# Patient Record
Sex: Female | Born: 1950 | Race: Black or African American | Hispanic: No | Marital: Married | Smoking: Never smoker
Health system: Southern US, Community
[De-identification: ages and names within clinical notes are randomized; demographics above are authoritative.]

## PROBLEM LIST (undated history)

## (undated) DIAGNOSIS — F419 Anxiety disorder, unspecified: Secondary | ICD-10-CM

## (undated) DIAGNOSIS — M1990 Unspecified osteoarthritis, unspecified site: Secondary | ICD-10-CM

## (undated) DIAGNOSIS — E039 Hypothyroidism, unspecified: Secondary | ICD-10-CM

## (undated) DIAGNOSIS — R7303 Prediabetes: Secondary | ICD-10-CM

## (undated) DIAGNOSIS — I10 Essential (primary) hypertension: Secondary | ICD-10-CM

## (undated) DIAGNOSIS — Z860100 Personal history of colon polyps, unspecified: Secondary | ICD-10-CM

## (undated) DIAGNOSIS — D259 Leiomyoma of uterus, unspecified: Secondary | ICD-10-CM

## (undated) DIAGNOSIS — F329 Major depressive disorder, single episode, unspecified: Secondary | ICD-10-CM

## (undated) DIAGNOSIS — J302 Other seasonal allergic rhinitis: Secondary | ICD-10-CM

## (undated) DIAGNOSIS — K5790 Diverticulosis of intestine, part unspecified, without perforation or abscess without bleeding: Secondary | ICD-10-CM

## (undated) DIAGNOSIS — M722 Plantar fascial fibromatosis: Secondary | ICD-10-CM

## (undated) DIAGNOSIS — E785 Hyperlipidemia, unspecified: Secondary | ICD-10-CM

## (undated) HISTORY — DX: Personal history of colon polyps, unspecified: Z86.0100

## (undated) HISTORY — DX: Leiomyoma of uterus, unspecified: D25.9

## (undated) HISTORY — DX: Diverticulosis of intestine, part unspecified, without perforation or abscess without bleeding: K57.90

## (undated) HISTORY — DX: Hyperlipidemia, unspecified: E78.5

## (undated) HISTORY — DX: Major depressive disorder, single episode, unspecified: F32.9

## (undated) HISTORY — DX: Unspecified osteoarthritis, unspecified site: M19.90

## (undated) HISTORY — DX: Anxiety disorder, unspecified: F41.9

## (undated) HISTORY — DX: Other seasonal allergic rhinitis: J30.2

## (undated) HISTORY — DX: Plantar fascial fibromatosis: M72.2

## (undated) HISTORY — PX: KNEE SURGERY: SHX244

## (undated) HISTORY — DX: Prediabetes: R73.03

## (undated) HISTORY — DX: Hypothyroidism, unspecified: E03.9

## (undated) HISTORY — DX: Essential (primary) hypertension: I10

## (undated) HISTORY — DX: Personal history of colonic polyps: Z86.010

---

## 1978-05-28 HISTORY — PX: GASTRIC RESTRICTION SURGERY: SHX653

## 1988-05-28 HISTORY — PX: LAPAROSCOPIC CHOLECYSTECTOMY: SUR755

## 1999-04-18 ENCOUNTER — Encounter: Payer: Self-pay | Admitting: Nephrology

## 1999-04-18 ENCOUNTER — Ambulatory Visit (HOSPITAL_COMMUNITY): Admission: RE | Admit: 1999-04-18 | Discharge: 1999-04-18 | Payer: Self-pay | Admitting: Nephrology

## 1999-04-21 ENCOUNTER — Ambulatory Visit (HOSPITAL_COMMUNITY): Admission: RE | Admit: 1999-04-21 | Discharge: 1999-04-21 | Payer: Self-pay | Admitting: Nephrology

## 1999-04-21 ENCOUNTER — Encounter: Payer: Self-pay | Admitting: Nephrology

## 2000-05-07 ENCOUNTER — Encounter: Payer: Self-pay | Admitting: Nephrology

## 2000-05-07 ENCOUNTER — Encounter: Admission: RE | Admit: 2000-05-07 | Discharge: 2000-05-07 | Payer: Self-pay | Admitting: Nephrology

## 2000-09-28 ENCOUNTER — Emergency Department (HOSPITAL_COMMUNITY): Admission: EM | Admit: 2000-09-28 | Discharge: 2000-09-28 | Payer: Self-pay | Admitting: Emergency Medicine

## 2000-09-28 ENCOUNTER — Encounter: Payer: Self-pay | Admitting: Emergency Medicine

## 2001-10-30 ENCOUNTER — Emergency Department (HOSPITAL_COMMUNITY): Admission: EM | Admit: 2001-10-30 | Discharge: 2001-10-30 | Payer: Self-pay | Admitting: Emergency Medicine

## 2001-11-19 ENCOUNTER — Encounter: Payer: Self-pay | Admitting: Nephrology

## 2001-11-19 ENCOUNTER — Encounter: Admission: RE | Admit: 2001-11-19 | Discharge: 2001-11-19 | Payer: Self-pay | Admitting: Nephrology

## 2002-12-28 ENCOUNTER — Emergency Department (HOSPITAL_COMMUNITY): Admission: EM | Admit: 2002-12-28 | Discharge: 2002-12-28 | Payer: Self-pay | Admitting: Emergency Medicine

## 2003-11-02 ENCOUNTER — Encounter: Admission: RE | Admit: 2003-11-02 | Discharge: 2003-11-02 | Payer: Self-pay | Admitting: Internal Medicine

## 2003-11-06 ENCOUNTER — Ambulatory Visit (HOSPITAL_COMMUNITY): Admission: RE | Admit: 2003-11-06 | Discharge: 2003-11-06 | Payer: Self-pay | Admitting: Internal Medicine

## 2003-11-10 ENCOUNTER — Encounter: Admission: RE | Admit: 2003-11-10 | Discharge: 2003-11-10 | Payer: Self-pay | Admitting: Internal Medicine

## 2003-11-16 ENCOUNTER — Encounter: Admission: RE | Admit: 2003-11-16 | Discharge: 2003-11-16 | Payer: Self-pay | Admitting: Internal Medicine

## 2003-12-07 ENCOUNTER — Encounter: Admission: RE | Admit: 2003-12-07 | Discharge: 2003-12-07 | Payer: Self-pay | Admitting: Internal Medicine

## 2004-01-14 ENCOUNTER — Encounter: Admission: RE | Admit: 2004-01-14 | Discharge: 2004-01-14 | Payer: Self-pay | Admitting: Internal Medicine

## 2004-01-17 ENCOUNTER — Encounter (INDEPENDENT_AMBULATORY_CARE_PROVIDER_SITE_OTHER): Payer: Self-pay | Admitting: Internal Medicine

## 2004-01-17 ENCOUNTER — Ambulatory Visit (HOSPITAL_COMMUNITY): Admission: RE | Admit: 2004-01-17 | Discharge: 2004-01-17 | Payer: Self-pay | Admitting: Obstetrics and Gynecology

## 2004-01-17 LAB — HM PAP SMEAR: HM Pap smear: NORMAL

## 2004-01-17 LAB — CONVERTED CEMR LAB: Pap Smear: NORMAL

## 2004-02-03 ENCOUNTER — Ambulatory Visit (HOSPITAL_COMMUNITY): Admission: RE | Admit: 2004-02-03 | Discharge: 2004-02-03 | Payer: Self-pay | Admitting: Orthopedic Surgery

## 2004-02-03 ENCOUNTER — Ambulatory Visit (HOSPITAL_BASED_OUTPATIENT_CLINIC_OR_DEPARTMENT_OTHER): Admission: RE | Admit: 2004-02-03 | Discharge: 2004-02-03 | Payer: Self-pay | Admitting: Orthopedic Surgery

## 2004-02-15 ENCOUNTER — Encounter: Admission: RE | Admit: 2004-02-15 | Discharge: 2004-05-15 | Payer: Self-pay | Admitting: Orthopedic Surgery

## 2004-06-08 ENCOUNTER — Other Ambulatory Visit: Admission: RE | Admit: 2004-06-08 | Discharge: 2004-06-08 | Payer: Self-pay | Admitting: Obstetrics and Gynecology

## 2004-06-08 ENCOUNTER — Encounter (INDEPENDENT_AMBULATORY_CARE_PROVIDER_SITE_OTHER): Payer: Self-pay | Admitting: *Deleted

## 2004-06-08 ENCOUNTER — Ambulatory Visit: Payer: Self-pay | Admitting: Obstetrics and Gynecology

## 2004-06-27 ENCOUNTER — Ambulatory Visit: Payer: Self-pay | Admitting: Internal Medicine

## 2004-07-06 ENCOUNTER — Inpatient Hospital Stay (HOSPITAL_COMMUNITY): Admission: AD | Admit: 2004-07-06 | Discharge: 2004-07-06 | Payer: Self-pay | Admitting: Obstetrics & Gynecology

## 2004-09-14 ENCOUNTER — Ambulatory Visit: Payer: Self-pay | Admitting: Internal Medicine

## 2005-01-11 ENCOUNTER — Ambulatory Visit: Payer: Self-pay | Admitting: Obstetrics and Gynecology

## 2005-01-17 ENCOUNTER — Ambulatory Visit (HOSPITAL_COMMUNITY): Admission: RE | Admit: 2005-01-17 | Discharge: 2005-01-17 | Payer: Self-pay | Admitting: Obstetrics and Gynecology

## 2005-02-11 ENCOUNTER — Emergency Department (HOSPITAL_COMMUNITY): Admission: EM | Admit: 2005-02-11 | Discharge: 2005-02-11 | Payer: Self-pay | Admitting: Emergency Medicine

## 2005-10-18 ENCOUNTER — Ambulatory Visit: Payer: Self-pay | Admitting: Internal Medicine

## 2005-10-24 ENCOUNTER — Ambulatory Visit: Payer: Self-pay | Admitting: Internal Medicine

## 2005-11-15 ENCOUNTER — Ambulatory Visit: Payer: Self-pay | Admitting: Internal Medicine

## 2005-11-22 ENCOUNTER — Ambulatory Visit (HOSPITAL_COMMUNITY): Admission: RE | Admit: 2005-11-22 | Discharge: 2005-11-22 | Payer: Self-pay | Admitting: Obstetrics and Gynecology

## 2006-04-28 ENCOUNTER — Inpatient Hospital Stay (HOSPITAL_COMMUNITY): Admission: AD | Admit: 2006-04-28 | Discharge: 2006-04-29 | Payer: Self-pay | Admitting: Obstetrics & Gynecology

## 2006-05-06 ENCOUNTER — Encounter (INDEPENDENT_AMBULATORY_CARE_PROVIDER_SITE_OTHER): Payer: Self-pay | Admitting: Internal Medicine

## 2006-05-06 DIAGNOSIS — M25569 Pain in unspecified knee: Secondary | ICD-10-CM

## 2006-05-06 DIAGNOSIS — I10 Essential (primary) hypertension: Secondary | ICD-10-CM

## 2006-05-06 DIAGNOSIS — D509 Iron deficiency anemia, unspecified: Secondary | ICD-10-CM

## 2006-05-06 DIAGNOSIS — D259 Leiomyoma of uterus, unspecified: Secondary | ICD-10-CM | POA: Insufficient documentation

## 2006-05-06 DIAGNOSIS — M549 Dorsalgia, unspecified: Secondary | ICD-10-CM | POA: Insufficient documentation

## 2006-05-06 DIAGNOSIS — E039 Hypothyroidism, unspecified: Secondary | ICD-10-CM

## 2006-05-06 HISTORY — DX: Leiomyoma of uterus, unspecified: D25.9

## 2006-05-17 ENCOUNTER — Ambulatory Visit: Payer: Self-pay | Admitting: Internal Medicine

## 2006-05-17 ENCOUNTER — Encounter (INDEPENDENT_AMBULATORY_CARE_PROVIDER_SITE_OTHER): Payer: Self-pay | Admitting: Internal Medicine

## 2006-05-17 DIAGNOSIS — F341 Dysthymic disorder: Secondary | ICD-10-CM

## 2006-05-17 LAB — CONVERTED CEMR LAB
BUN: 12 mg/dL (ref 6–23)
Chloride: 105 meq/L (ref 96–112)
Creatinine, Ser: 1.14 mg/dL (ref 0.40–1.20)
TSH: 9.513 microintl units/mL — ABNORMAL HIGH (ref 0.350–5.50)

## 2006-05-27 ENCOUNTER — Encounter (INDEPENDENT_AMBULATORY_CARE_PROVIDER_SITE_OTHER): Payer: Self-pay | Admitting: Internal Medicine

## 2006-05-27 ENCOUNTER — Ambulatory Visit: Payer: Self-pay | Admitting: Internal Medicine

## 2006-05-27 LAB — CONVERTED CEMR LAB
BUN: 11 mg/dL (ref 6–23)
CO2: 26 meq/L (ref 19–32)
Sodium: 140 meq/L (ref 135–145)

## 2006-06-24 ENCOUNTER — Ambulatory Visit: Payer: Self-pay | Admitting: Hospitalist

## 2006-06-24 DIAGNOSIS — H9319 Tinnitus, unspecified ear: Secondary | ICD-10-CM | POA: Insufficient documentation

## 2006-07-19 ENCOUNTER — Telehealth (INDEPENDENT_AMBULATORY_CARE_PROVIDER_SITE_OTHER): Payer: Self-pay | Admitting: Internal Medicine

## 2006-07-19 ENCOUNTER — Encounter (INDEPENDENT_AMBULATORY_CARE_PROVIDER_SITE_OTHER): Payer: Self-pay | Admitting: Internal Medicine

## 2006-07-19 ENCOUNTER — Ambulatory Visit: Payer: Self-pay | Admitting: Internal Medicine

## 2006-07-19 ENCOUNTER — Telehealth: Payer: Self-pay | Admitting: *Deleted

## 2006-07-19 LAB — CONVERTED CEMR LAB
BUN: 15 mg/dL (ref 6–23)
Chloride: 105 meq/L (ref 96–112)
Creatinine, Ser: 1.03 mg/dL (ref 0.40–1.20)
Potassium: 4 meq/L (ref 3.5–5.3)
Sodium: 143 meq/L (ref 135–145)

## 2006-07-26 ENCOUNTER — Telehealth: Payer: Self-pay | Admitting: *Deleted

## 2007-06-06 ENCOUNTER — Ambulatory Visit (HOSPITAL_COMMUNITY): Admission: RE | Admit: 2007-06-06 | Discharge: 2007-06-06 | Payer: Self-pay | Admitting: Obstetrics & Gynecology

## 2007-06-12 ENCOUNTER — Encounter: Admission: RE | Admit: 2007-06-12 | Discharge: 2007-06-12 | Payer: Self-pay | Admitting: Nephrology

## 2007-06-25 ENCOUNTER — Ambulatory Visit (HOSPITAL_COMMUNITY): Admission: RE | Admit: 2007-06-25 | Discharge: 2007-06-25 | Payer: Self-pay | Admitting: Obstetrics & Gynecology

## 2007-07-29 ENCOUNTER — Ambulatory Visit (HOSPITAL_BASED_OUTPATIENT_CLINIC_OR_DEPARTMENT_OTHER): Admission: RE | Admit: 2007-07-29 | Discharge: 2007-07-29 | Payer: Self-pay | Admitting: Orthopedic Surgery

## 2007-07-29 ENCOUNTER — Encounter (INDEPENDENT_AMBULATORY_CARE_PROVIDER_SITE_OTHER): Payer: Self-pay | Admitting: Orthopedic Surgery

## 2009-03-25 ENCOUNTER — Ambulatory Visit: Payer: Self-pay | Admitting: Internal Medicine

## 2009-03-25 DIAGNOSIS — M79609 Pain in unspecified limb: Secondary | ICD-10-CM

## 2009-03-26 ENCOUNTER — Encounter (INDEPENDENT_AMBULATORY_CARE_PROVIDER_SITE_OTHER): Payer: Self-pay | Admitting: Internal Medicine

## 2009-03-29 LAB — CONVERTED CEMR LAB
ALT: 54 units/L — ABNORMAL HIGH (ref 0–35)
Albumin: 4.4 g/dL (ref 3.5–5.2)
BUN: 9 mg/dL (ref 6–23)
Cholesterol: 216 mg/dL — ABNORMAL HIGH (ref 0–200)
HDL: 49 mg/dL (ref >39)
MCHC: 32.3 g/dL (ref 30.0–36.0)
Potassium: 4 meq/L (ref 3.5–5.3)
RBC: 4.31 M/uL (ref 3.87–5.11)
RDW: 13.3 % (ref 11.5–15.5)
TSH: 8.423 microintl units/mL — ABNORMAL HIGH (ref 0.350–4.5)
Total Bilirubin: 0.4 mg/dL (ref 0.3–1.2)
Total Protein: 7.8 g/dL (ref 6.0–8.3)

## 2009-03-31 ENCOUNTER — Encounter: Payer: Self-pay | Admitting: Internal Medicine

## 2009-04-05 ENCOUNTER — Ambulatory Visit: Payer: Self-pay | Admitting: Infectious Disease

## 2009-04-14 ENCOUNTER — Telehealth (INDEPENDENT_AMBULATORY_CARE_PROVIDER_SITE_OTHER): Payer: Self-pay | Admitting: *Deleted

## 2009-04-18 ENCOUNTER — Ambulatory Visit: Payer: Self-pay | Admitting: Internal Medicine

## 2009-04-18 DIAGNOSIS — R059 Cough, unspecified: Secondary | ICD-10-CM | POA: Insufficient documentation

## 2009-04-18 DIAGNOSIS — R05 Cough: Secondary | ICD-10-CM

## 2009-04-18 LAB — CONVERTED CEMR LAB
Free T4: 0.9 ng/dL (ref 0.80–1.80)
TSH: 3.548 microintl units/mL (ref 0.350–4.5)

## 2009-04-19 ENCOUNTER — Encounter: Payer: Self-pay | Admitting: Internal Medicine

## 2009-05-18 ENCOUNTER — Encounter: Payer: Self-pay | Admitting: Internal Medicine

## 2009-05-18 ENCOUNTER — Ambulatory Visit: Payer: Self-pay | Admitting: Internal Medicine

## 2009-05-19 LAB — CONVERTED CEMR LAB
CO2: 24 meq/L (ref 19–32)
Calcium: 10.1 mg/dL (ref 8.4–10.5)
Chloride: 103 meq/L (ref 96–112)
Creatinine, Ser: 0.78 mg/dL (ref 0.40–1.20)
Potassium: 4.4 meq/L (ref 3.5–5.3)
Sodium: 140 meq/L (ref 135–145)

## 2009-06-01 ENCOUNTER — Ambulatory Visit: Payer: Self-pay | Admitting: Internal Medicine

## 2009-06-01 DIAGNOSIS — R740 Nonspecific elevation of levels of transaminase and lactic acid dehydrogenase [LDH]: Secondary | ICD-10-CM

## 2009-06-01 DIAGNOSIS — R7401 Elevation of levels of liver transaminase levels: Secondary | ICD-10-CM | POA: Insufficient documentation

## 2009-07-26 ENCOUNTER — Ambulatory Visit: Payer: Self-pay | Admitting: Internal Medicine

## 2009-07-26 DIAGNOSIS — R350 Frequency of micturition: Secondary | ICD-10-CM

## 2009-07-26 LAB — CONVERTED CEMR LAB
AST: 34 units/L (ref 0–37)
Alkaline Phosphatase: 89 units/L (ref 39–117)
BUN: 13 mg/dL (ref 6–23)
Bacteria, UA: NONE SEEN
Bilirubin Urine: NEGATIVE
CO2: 30 meq/L (ref 19–32)
Glucose, Bld: 100 mg/dL — ABNORMAL HIGH (ref 70–99)
Hemoglobin, Urine: NEGATIVE
Nitrite: NEGATIVE
Protein, ur: 30 mg/dL — AB
Total Bilirubin: 0.4 mg/dL (ref 0.3–1.2)
Urine Glucose: NEGATIVE mg/dL
pH: 6.5 (ref 5.0–8.0)

## 2009-07-27 ENCOUNTER — Ambulatory Visit: Payer: Self-pay | Admitting: Sports Medicine

## 2009-07-27 DIAGNOSIS — M7660 Achilles tendinitis, unspecified leg: Secondary | ICD-10-CM

## 2009-08-02 ENCOUNTER — Ambulatory Visit (HOSPITAL_COMMUNITY): Admission: RE | Admit: 2009-08-02 | Discharge: 2009-08-02 | Payer: Self-pay | Admitting: Internal Medicine

## 2009-08-02 LAB — HM MAMMOGRAPHY: HM Mammogram: NEGATIVE

## 2009-08-17 ENCOUNTER — Ambulatory Visit: Payer: Self-pay | Admitting: Sports Medicine

## 2009-08-22 ENCOUNTER — Encounter: Payer: Self-pay | Admitting: Internal Medicine

## 2009-08-30 ENCOUNTER — Encounter: Admission: RE | Admit: 2009-08-30 | Discharge: 2009-08-30 | Payer: Self-pay | Admitting: Surgery

## 2009-09-01 ENCOUNTER — Ambulatory Visit (HOSPITAL_BASED_OUTPATIENT_CLINIC_OR_DEPARTMENT_OTHER): Admission: RE | Admit: 2009-09-01 | Discharge: 2009-09-01 | Payer: Self-pay | Admitting: Surgery

## 2009-09-29 ENCOUNTER — Telehealth: Payer: Self-pay | Admitting: Internal Medicine

## 2009-10-26 ENCOUNTER — Ambulatory Visit: Payer: Self-pay | Admitting: Sports Medicine

## 2009-11-07 ENCOUNTER — Ambulatory Visit: Payer: Self-pay | Admitting: Internal Medicine

## 2009-11-07 LAB — CONVERTED CEMR LAB: TSH: 4.031 microintl units/mL (ref 0.350–4.5)

## 2009-11-25 ENCOUNTER — Encounter (INDEPENDENT_AMBULATORY_CARE_PROVIDER_SITE_OTHER): Payer: Self-pay | Admitting: *Deleted

## 2009-12-13 ENCOUNTER — Encounter (INDEPENDENT_AMBULATORY_CARE_PROVIDER_SITE_OTHER): Payer: Self-pay | Admitting: *Deleted

## 2009-12-14 ENCOUNTER — Ambulatory Visit: Payer: Self-pay | Admitting: Obstetrics and Gynecology

## 2009-12-16 ENCOUNTER — Ambulatory Visit: Payer: Self-pay | Admitting: Gastroenterology

## 2009-12-16 ENCOUNTER — Encounter (INDEPENDENT_AMBULATORY_CARE_PROVIDER_SITE_OTHER): Payer: Self-pay | Admitting: *Deleted

## 2009-12-19 ENCOUNTER — Ambulatory Visit (HOSPITAL_COMMUNITY): Admission: RE | Admit: 2009-12-19 | Discharge: 2009-12-19 | Payer: Self-pay | Admitting: Family Medicine

## 2009-12-26 ENCOUNTER — Ambulatory Visit: Payer: Self-pay | Admitting: Family Medicine

## 2009-12-28 ENCOUNTER — Ambulatory Visit: Payer: Self-pay | Admitting: Obstetrics and Gynecology

## 2009-12-28 ENCOUNTER — Telehealth (INDEPENDENT_AMBULATORY_CARE_PROVIDER_SITE_OTHER): Payer: Self-pay | Admitting: *Deleted

## 2009-12-30 ENCOUNTER — Ambulatory Visit: Payer: Self-pay | Admitting: Gastroenterology

## 2010-01-04 ENCOUNTER — Encounter: Payer: Self-pay | Admitting: Gastroenterology

## 2010-01-06 ENCOUNTER — Ambulatory Visit: Payer: Self-pay | Admitting: Internal Medicine

## 2010-01-06 DIAGNOSIS — M25559 Pain in unspecified hip: Secondary | ICD-10-CM

## 2010-02-01 ENCOUNTER — Telehealth: Payer: Self-pay | Admitting: Internal Medicine

## 2010-03-22 ENCOUNTER — Ambulatory Visit: Payer: Self-pay | Admitting: Obstetrics and Gynecology

## 2010-03-28 ENCOUNTER — Encounter: Payer: Self-pay | Admitting: Internal Medicine

## 2010-03-28 HISTORY — PX: TOTAL ABDOMINAL HYSTERECTOMY W/ BILATERAL SALPINGOOPHORECTOMY: SHX83

## 2010-04-03 ENCOUNTER — Encounter: Payer: Self-pay | Admitting: Obstetrics and Gynecology

## 2010-04-03 ENCOUNTER — Inpatient Hospital Stay (HOSPITAL_COMMUNITY): Admission: RE | Admit: 2010-04-03 | Discharge: 2010-04-05 | Payer: Self-pay | Admitting: Obstetrics and Gynecology

## 2010-04-13 ENCOUNTER — Ambulatory Visit: Payer: Self-pay | Admitting: Obstetrics & Gynecology

## 2010-05-03 ENCOUNTER — Ambulatory Visit: Payer: Self-pay | Admitting: Obstetrics and Gynecology

## 2010-05-05 ENCOUNTER — Ambulatory Visit: Payer: Self-pay | Admitting: Family Medicine

## 2010-06-18 ENCOUNTER — Encounter: Payer: Self-pay | Admitting: Nephrology

## 2010-06-29 NOTE — Letter (Signed)
Summary: Patient Notice- Polyp Results  [facility]  [address] [place], [place] [address]   Phone: [phone]  Fax: [phone]        January 04, 2010 MRN: [id number]    Joanna Gilbert [address] [place], [place]  [address]    Dear [name],  I am pleased to inform you that the colon polyp(s) removed during your recent colonoscopy was (were) found to be benign (no cancer detected) upon pathologic examination.  I recommend you have a repeat colonoscopy examination in 5_ years to look for recurrent polyps, as having colon polyps increases your risk for having recurrent polyps or even colon cancer in the future.  Should you develop new or worsening symptoms of abdominal pain, bowel habit changes or bleeding from the rectum or bowels, please schedule an evaluation with either your primary care physician or with me.  Additional information/recommendations:  __ No further action with gastroenterology is needed at this time. Please      follow-up with your primary care physician for your other healthcare      needs.  __ Please call [phone] to schedule a return visit to review your      situation.  __ Please keep your follow-up visit as already scheduled.  _x_ Continue treatment plan as outlined the day of your exam.  Please call us if you are having persistent problems or have questions about your condition that have not been fully answered at this time.  Sincerely,  Louis Meckel MD  This letter has been electronically signed by your physician.  Appended Document: Patient Notice- Polyp Results letter mailed

## 2010-06-29 NOTE — Assessment & Plan Note (Signed)
Summary: RECK BP/MAGICK/VS   Vital Signs:  Patient profile:   61 year old female Height:      65 inches (165.10 cm) Weight:      256.7 pounds (117.59 kg) BMI:     43.21 Temp:     97.7 degrees F (36.50 degrees C) oral Pulse rate:   83 / minute BP sitting:   155 / 95  (right arm) Cuff size:   large  Vitals Entered By: Theotis Barrio NT II (June 01, 2009 8:46 AM) CC: BP FOLLOW P, Depression Is Patient Diabetic? No Pain Assessment Patient in pain? yes     Location: FEET Intensity:      3 Type: aching Onset of pain  Chronic Nutritional Status BMI of > 30 = obese  Have you ever been in a relationship where you felt threatened, hurt or afraid?No   Does patient need assistance? Functional Status Self care Ambulation Normal Comments BP FOLLOW P   Primary Care Provider:  Mliss Sax MD  CC:  BP FOLLOW P and Depression.  History of Present Illness: 60 yo woman with PMH of HTN who presents for 2 week fu of HTN.  Patient had to stop Lisinopril in last few months 2/2 cough.  Was started on HCTZ then amlidipine.   Patient says that she had cramps on HCTZ that she thinks started after she started taking the HCTZ. She says the has never had these cramps before. She took  ~7 days of therapy and had cramps that started on the first day of therapy. She had these cramps for  ~3 days. She stopped taking the medicine  ~2 weeks before her last appointment.  Of note, she has not been taking HCTZ since her last appointment. The plan at last visit was to check her K+ and have her restart HCTZ plus or minus K+ supplementation given results but patient has not actually restarted HCTZ.  Cyst on head: Patient says cyst is getting smaller since last visit.  Plantar Fasciitis: Has sports med appointment today.  Preventive Health: She knows she is not up todate on mammo, colooscopy, etc. She tells me that she has no insurance but is getting all of her paperwork together for the orange card  and that she wants me to wait before making any referrals until she has orange card soon. She refuses flu today.   Depression History:      The patient denies a depressed mood most of the day and a diminished interest in her usual daily activities.         Preventive Screening-Counseling & Management  Alcohol-Tobacco     Smoking Status: never  Caffeine-Diet-Exercise     Does Patient Exercise: yes     Type of exercise: WALKING / CARDIO  Problems Prior to Update: 1)  Nonspecific Abn Fndng Rad & Oth Exm Skull & Head  (ICD-793.0) 2)  Cough  (ICD-786.2) 3)  Foot Pain, Bilateral  (ICD-729.5) 4)  Tinnitus, Chronic, Bilateral  (ICD-388.30) 5)  Hypertension  (ICD-401.9) 6)  Anxiety Depression  (ICD-300.4) 7)  Hypothyroidism  (ICD-244.9) 8)  Anemia, Iron Deficiency  (ICD-280.9) 9)  Obesity  (ICD-278.00) 10)  Fibroids, Uterus  (ICD-218.9) 11)  Knee Pain  (ICD-719.46) 12)  Back Pain  (ICD-724.5)  Current Medications (verified): 1)  Levothyroxine Sodium 100 Mcg Tabs (Levothyroxine Sodium) .... Once Daily 2)  Hydrochlorothiazide 25 Mg Tabs (Hydrochlorothiazide) .... Take 1 Tablet By Mouth Daily. 3)  Omeprazole 40 Mg Cpdr (Omeprazole) .... Take 1 Tablet  By Mouth Daily. 4)  Norvasc 10 Mg Tabs (Amlodipine Besylate) .... Take 1 Tablet By Mouth Daily.  Allergies (verified): 1)  ! Lisinopril  Review of Systems  The patient denies anorexia, fever, weight loss, weight gain, vision loss, decreased hearing, hoarseness, chest pain, syncope, dyspnea on exertion, peripheral edema, prolonged cough, headaches, hemoptysis, abdominal pain, melena, hematochezia, severe indigestion/heartburn, hematuria, incontinence, genital sores, muscle weakness, transient blindness, difficulty walking, depression, unusual weight change, abnormal bleeding, and angioedema.    Physical Exam  General:  alert, well-developed, and well-nourished.  Obese. Head:  normocephalic and atraumatic.   Eyes:  vision grossly intact,  pupils equal, pupils round, and pupils reactive to light.   Ears:  R ear normal and L ear normal.   Nose:  no external deformity.   Lungs:  normal respiratory effort, no accessory muscle use, normal breath sounds, no crackles, and no wheezes.   Heart:  normal rate, regular rhythm, no murmur, no gallop, and no rub.   Abdomen:  soft, non-tender, and normal bowel sounds.   Neurologic:  alert & oriented X3, cranial nerves II-XII intact, and strength normal in all extremities.   Skin:  Patient has few CM cyst on top of head.  Psych:  Oriented X3, memory intact for recent and remote, normally interactive, good eye contact, not anxious appearing, and not depressed appearing.     Impression & Recommendations:  Problem # 1:  HYPERTENSION (ICD-401.9) Patient stopped taking HCTZ  ~2 weeks before her last appointment, and since her K+ was checked at that appointment and was normal it is not possible to know if the cramps she was having her due to low K+ or not. Her BP is improved on amlodipine alone, but not yet at goal.  She has not been taking HCTZ since her last appointment. The plan at last visit was to check her K+ and have her restart HCTZ plus or minus K+ supplementation given results but patient has not actually restarted HCTZ. I think that HCTZ is such an effective, cheap medication, especially when combined with amlodipine that she deserves a trial of therapy again. I will have her start taking HCTZ again. I will not provide K+ supplementation as I have no real evidence that her K+ was low, although it is possible as mentioned above. I will bring her back to see me next week at which time I will check K+. If she does experience cramps, I have asked her to call the clinic to come in to be seen. We can check her K+ at that time and see if it is low. My hope is that she will restart HCTZ without problem.    Her updated medication list for this problem includes:    Hydrochlorothiazide 25 Mg Tabs  (Hydrochlorothiazide) .Marland Kitchen... Take 1 tablet by mouth daily.    Norvasc 10 Mg Tabs (Amlodipine besylate) .Marland Kitchen... Take 1 tablet by mouth daily.  Problem # 2:  NONSPECIFIC ABN FNDNG RAD & OTH EXM SKULL & HEAD (ICD-793.0) Cyst getting smaller per patient. WIll simply observe for now.   Problem # 3:  FOOT PAIN, BILATERAL (ICD-729.5) Seeing sports med today. Will follow up with her next week to see how this goes since she is coming back for HTN management next week anyway.   Problem # 4:  Preventive Health Care (ICD-V70.0) She knows she is not up to date on mammo, colooscopy, etc. She tells me that she has no insurance but is getting all of her paperwork together for  the orange card and that she wants me to wait before making any referrals until she has orange card soon. She refuses flu today. We will need to make referral once she agrees when she gets her orange card.   Problem # 5:  TRANSAMINASES, SERUM, ELEVATED (ICD-790.4) I see that at a visit in 02-2009 that patient had elevated transaminases of AST 60, ALT 54. This was never followed up. I was going to check them today again but since she is coming to see me next week for a lab draw after restarting HCTZ I will simply get a Cmet at that time to see if they are still elevated. If they are she will need hepatitis panel, RUQ [place].   Complete Medication List: 1)  Levothyroxine Sodium 100 Mcg Tabs (Levothyroxine sodium) .... Once daily 2)  Hydrochlorothiazide 25 Mg Tabs (Hydrochlorothiazide) .... Take 1 tablet by mouth daily. 3)  Omeprazole 40 Mg Cpdr (Omeprazole) .... Take 1 tablet by mouth daily. 4)  Norvasc 10 Mg Tabs (Amlodipine besylate) .... Take 1 tablet by mouth daily.  Patient Instructions: 1)  Please restart HCTZ and make an appointment for next week. If you experience cramping, please call the clinic so that we can bring you in to check your potassium. Please continue to take the medication if you can until you are seen in clinic even if you  have cramping.   Prevention & Chronic Care Immunizations   Influenza vaccine: Not documented   Influenza vaccine deferral: Deferred  (06/01/2009)    Tetanus booster: Not documented   Td booster deferral: Deferred  (06/01/2009)    Pneumococcal vaccine: Not documented  Colorectal Screening   Hemoccult: Not documented   Hemoccult action/deferral: Deferred  (06/01/2009)    Colonoscopy: Not documented   Colonoscopy action/deferral: Deferred  (06/01/2009)  Other Screening   Pap smear: Normal  (01/17/2004)   Pap smear action/deferral: Deferred  (06/01/2009)    Mammogram: Normal  (11/26/2005)   Mammogram action/deferral: Deferred  (06/01/2009)   Smoking status: never  (06/01/2009)  Lipids   Total Cholesterol: 216  (03/26/2009)   Lipid panel action/deferral: Lipid Panel ordered   LDL: 141  (03/26/2009)   LDL Direct: Not documented   HDL: 49  (03/26/2009)   Triglycerides: 132  (03/26/2009)  Hypertension   Last Blood Pressure: 155 / 95  (06/01/2009)   Serum creatinine: 0.78  (05/18/2009)   Serum potassium 4.4  (05/18/2009)    Hypertension flowsheet reviewed?: Yes   Progress toward BP goal: Improved  Self-Management Support :   Personal Goals (by the next clinic visit) :      Personal blood pressure goal: 140/90  (03/25/2009)   Patient will work on the following items until the next clinic visit to reach self-care goals:     Medications and monitoring: take my medicines every day, bring all of my medications to every visit  (06/01/2009)     Eating: eat more vegetables, use fresh or frozen vegetables, eat foods that are low in salt, eat baked foods instead of fried foods, eat fruit for snacks and desserts  (06/01/2009)    Hypertension self-management support: Written self-care plan  (06/01/2009)   Hypertension self-care plan printed.

## 2010-06-29 NOTE — Consult Note (Signed)
Summary: [facility]   [facility]   Imported By: Margie Billet 05/24/2010 12:29:29  _____________________________________________________________________  External Attachment:    Type:   Image     Comment:   External Document

## 2010-06-29 NOTE — Assessment & Plan Note (Signed)
Summary: FU FOOT PAIN/MJD   Vital Signs:  Patient profile:   60 year old female BP sitting:   128 / 82  Vitals Entered By: Lillia Pauls CMA (October 26, 2009 3:04 PM)  Primary Provider:  Mliss Sax MD   History of Present Illness: f/u bilateral plantar fasciitis and left achilles tendonitis. Bought asics shoes since LOV. wearign them with comforthotics.  PF symptoms improved on left. Symptoms still bothersome on right. Has developed some distal left AT pain as well. No clear moderating factors. No paresthesias. Performing PF stretches daily. Inconsistently performing other exercises.  Allergies: 1)  ! Lisinopril  Physical Exam  General:  Well-developed,well-nourished,in no acute distress; alert,appropriate and cooperative throughout examination Msk:  ANKLES/FEET: Very mild prox PF ttp on left. Mild-to-mod prox PF ttp on right. Slight distal AT ttp on right. Normal AT function. No swelling or deformity.    Impression & Recommendations:  Problem # 1:  PLANTAR FASCIITIS, BILATERAL (ICD-728.71) PF symptoms improved on left.  - Probation officer of custom orthotics. Call us if she decides to have orthotics constructed during next office visit. - Start tramadol. - RTC in 4 wks.  Her updated medication list for this problem includes:    Nabumetone 500 Mg Tabs (Nabumetone) .Marland Kitchen... 1 tab by mouth with food bid  Problem # 2:  ACHILLES TENDINITIS, MILD (ICD-726.71)  - Heel lifts added to current comforthotics. - Remaining plan per item #1.  Problem # 3:  OTHER ACQUIRED DEFORMITY OF ANKLE AND FOOT OTHER (ICD-736.79)  - Heel lifts added to current comforthotics.  Complete Medication List: 1)  Levothyroxine Sodium 100 Mcg Tabs (Levothyroxine sodium) .... Once daily 2)  Hydrochlorothiazide 25 Mg Tabs (Hydrochlorothiazide) .... Take 1 tablet by mouth daily. 3)  Omeprazole 40 Mg Cpdr (Omeprazole) .... Take 1 tablet by mouth daily. 4)  Norvasc 10 Mg Tabs (Amlodipine  besylate) .... Take 1 tablet by mouth daily. 5)  Nabumetone 500 Mg Tabs (Nabumetone) .Marland Kitchen.. 1 tab by mouth with food bid 6)  Tramadol Hcl 50 Mg Tabs (Tramadol hcl) .Marland Kitchen.. 1 tab by mouth q 6 hrs as needed for pain Prescriptions: TRAMADOL HCL 50 MG TABS (TRAMADOL HCL) 1 tab by mouth q 6 hrs as needed for pain  #120 x 0   Entered and Authorized by:   Valarie Merino MD   Signed by:   Valarie Merino MD on 10/26/2009   Method used:   Print then Give to Patient   RxID:   [id number]

## 2010-06-29 NOTE — Assessment & Plan Note (Signed)
Summary: ORTHOTICS,[facility]   Vital Signs:  Patient profile:   60 year old female BP sitting:   137 / 67  (left arm)  Vitals Entered By: Terese Door (December 26, 2009 2:09 PM) CC: Orthotics   Primary Care Provider:  Mliss Sax MD  CC:  Orthotics.  History of Present Illness: B foot pain diagnosed with B plantar fasciitis--is here for custom molded othortics. Pain is in both feet, pretty constant, worsens with standing. Rest makes it some better butthe first few steps after exended period of rest (sleeping) are very painful. Then ittypically gets a little better and thenworsens as the day progresses.  also has some left achilles pain as previously described--the small heel lift seemed to help this some PERTINENT PMH/PSH: no prior foot injury or surgery  Allergies: 1)  ! Lisinopril  Past History:  Past Medical History: Last updated: 05/06/2006 Hypertension Hypothyroidism  Review of Systems  The patient denies fever, weight loss, and weight gain.    Physical Exam  General:  alert, well-developed, well-nourished, well-hydrated, and overweight-appearing.   Msk:  B severe pes planus. Wide forefoot.  TTP at origin of planar fascia B.  Sensation to soft touch is intact B feet DP pulses 2+ B= Skin is without lesion--she does have some increased callous B medial great toes.  Gait is normal stride length, normal stance phase, out toeing. Additional Exam:  Patient was fitted for a : standard, cushioned, semi-rigid orthotic. The orthotic was heated and afterward the patient stood on the orthotic blank positioned on the orthotic stand. The patient was positioned in subtalar neutral position and 10 degrees of ankle dorsiflexion in a weight bearing stance. After completion of molding, a stable base was applied to the orthotic blank. The blank was ground to a stable position for weight bearing.  Base:Black blank with F3 white firm base Posting: Medial first ray B (I did not build up  left orthotic with lift---will see how her achilles tendonitis does in the orthotic)    Impression & Recommendations:  Problem # 1:  PLANTAR FASCIITIS, BILATERAL (ICD-728.71)  Her updated medication list for this problem includes:    Nabumetone 500 Mg Tabs (Nabumetone) .Marland Kitchen... 1 tab by mouth with food bid  Orders: [employer], each unit (L3002) orthotics manufactured face to face time 45 minutes in evaluation of foot problems and construction of orthotics. RTC prn  Problem # 2:  ACHILLES TENDINITIS, MILD (ICD-726.71)  Orders: [employer], each unit (J1914)  Complete Medication List: 1)  Levothyroxine Sodium 100 Mcg Tabs (Levothyroxine sodium) .... Once daily 2)  Hydrochlorothiazide 25 Mg Tabs (Hydrochlorothiazide) .... Take 1 tablet by mouth daily. 3)  Omeprazole 40 Mg Cpdr (Omeprazole) .... Take 1 tablet by mouth daily. 4)  Norvasc 10 Mg Tabs (Amlodipine besylate) .... Take 1 tablet by mouth daily. 5)  Nabumetone 500 Mg Tabs (Nabumetone) .Marland Kitchen.. 1 tab by mouth with food bid 6)  Tramadol Hcl 50 Mg Tabs (Tramadol hcl) .Marland Kitchen.. 1 tab by mouth q 6 hrs as needed for pain

## 2010-06-29 NOTE — Miscellaneous (Signed)
Summary: direct colon--ch.  Clinical Lists Changes  Medications: Added new medication of MIRALAX   POWD (POLYETHYLENE GLYCOL 3350) As directed - Signed Added new medication of REGLAN 10 MG  TABS (METOCLOPRAMIDE HCL) As directed - Signed Added new medication of DULCOLAX 5 MG  TBEC (BISACODYL) As directed - Signed Rx of MIRALAX   POWD (POLYETHYLENE GLYCOL 3350) As directed;  #255 x 0;  Signed;  Entered by: Clide Cliff RN;  Authorized by: Rachael Fee MD;  Method used: Electronically to [facility] [phone]*, [address], [place], [place]  [address], Ph: [phone], Fax: [phone] Rx of REGLAN 10 MG  TABS (METOCLOPRAMIDE HCL) As directed;  #2 x 0;  Signed;  Entered by: Clide Cliff RN;  Authorized by: Rachael Fee MD;  Method used: Electronically to [facility] [phone]*, [address], [place], [place]  [address], Ph: [phone], Fax: [phone] Rx of DULCOLAX 5 MG  TBEC (BISACODYL) As directed;  #4 x 0;  Signed;  Entered by: Clide Cliff RN;  Authorized by: Rachael Fee MD;  Method used: Electronically to [facility] [phone]*, [address], [place], [place]  [address], Ph: [phone], Fax: [phone] Observations: Added new observation of ALLERGY REV: Done (12/16/2009 8:52)    Prescriptions: DULCOLAX 5 MG  TBEC (BISACODYL) As directed  #4 x 0   Entered by:   Clide Cliff RN   Authorized by:   Rachael Fee MD   Signed by:   Clide Cliff RN on 12/16/2009   Method used:   Electronically to        [facility] [phone]* (retail)       [address]       [place], [place]  [address]       Ph: [phone]       Fax: [phone]   RxID:   [id number] REGLAN 10 MG  TABS (METOCLOPRAMIDE HCL) As directed  #2 x 0   Entered by:   Clide Cliff RN   Authorized by:   Rachael Fee MD   Signed by:   Clide Cliff RN on 12/16/2009   Method used:   Electronically to        [facility] [phone]* (retail)       [address]     [place], [place]  [address]       Ph: [phone]       Fax: [phone]   RxID:   [id number] MIRALAX   POWD (POLYETHYLENE GLYCOL 3350) As directed  #255 x 0   Entered by:   Clide Cliff RN   Authorized by:   Rachael Fee MD   Signed by:   Clide Cliff RN on 12/16/2009   Method used:   Electronically to        [facility] [phone]* (retail)       [address]       [place], [place]  [address]       Ph: [phone]       Fax: [phone]   RxID:   [id number]

## 2010-06-29 NOTE — Letter (Signed)
Summary: [facility] Instructions  [facility]  [address] [place], [place] [address]   Phone: [phone]  Fax: [phone]       LAURITA PERON    September 24, 1950    MRN: [id number]        Procedure Day Dorna Bloom: Farrell Ours  12/30/09     Arrival Time:  10:30am     Procedure Time: 11:30am     Location of Procedure:                    _X _  [facility] (4th Floor)                        PREPARATION FOR COLONOSCOPY WITH MOVIPREP   Starting 5 days prior to your procedure  SUNDAY 07/31  do not eat nuts, seeds, popcorn, corn, beans, peas,  salads, or any raw vegetables.  Do not take any fiber supplements (e.g. Metamucil, Citrucel, and Benefiber).  THE DAY BEFORE YOUR PROCEDURE         DATE:  THURSDAY  08/04  1.  Drink clear liquids the entire day-NO SOLID FOOD  2.  Do not drink anything colored red or purple.  Avoid juices with pulp.  No orange juice.  3.  Drink at least 64 oz. (8 glasses) of fluid/clear liquids during the day to prevent dehydration and help the prep work efficiently.  CLEAR LIQUIDS INCLUDE: Water Jello Ice Popsicles Tea (sugar ok, no milk/cream) Powdered fruit flavored drinks Coffee (sugar ok, no milk/cream) Gatorade Juice: apple, white grape, white cranberry  Lemonade Clear bullion, consomm, broth Carbonated beverages (any kind) Strained chicken noodle soup Hard Candy                             4.  In the morning, mix first dose of MoviPrep solution:    Empty 1 Pouch A and 1 Pouch B into the disposable container    Add lukewarm drinking water to the top line of the container. Mix to dissolve    Refrigerate (mixed solution should be used within 24 hrs)  5.  Begin drinking the prep at 5:00 p.m. The MoviPrep container is divided by 4 marks.   Every 15 minutes drink the solution down to the next mark (approximately 8 oz) until the full liter is complete.   6.  Follow completed prep with 16 oz of clear liquid of your choice  (Nothing red or purple).  Continue to drink clear liquids until bedtime.  7.  Before going to bed, mix second dose of MoviPrep solution:    Empty 1 Pouch A and 1 Pouch B into the disposable container    Add lukewarm drinking water to the top line of the container. Mix to dissolve    Refrigerate  THE DAY OF YOUR PROCEDURE      DATE: FRIDAY  08/05  Beginning at 6:30 a.m. (5 hours before procedure):         1. Every 15 minutes, drink the solution down to the next mark (approx 8 oz) until the full liter is complete.  2. Follow completed prep with 16 oz. of clear liquid of your choice.    3. You may drink clear liquids until 9:30am  (2 HOURS BEFORE PROCEDURE).   MEDICATION INSTRUCTIONS  Unless otherwise instructed, you should take regular prescription medications with a small sip of water   as early as possible  the morning of your procedure.  Diabetic patients - see separate instructions.  Stop taking Plavix or Aggrenox on  _  _  (7 days before procedure).     Stop taking Coumadin on  _ _  (5 days before procedure).  Additional medication instructions: _         OTHER INSTRUCTIONS  You will need a responsible adult at least 58 years of age to accompany you and drive you home.   This person must remain in the waiting room during your procedure.  Wear loose fitting clothing that is easily removed.  Leave jewelry and other valuables at home.  However, you may wish to bring a book to read or  an iPod/MP3 player to listen to music as you wait for your procedure to start.  Remove all body piercing jewelry and leave at home.  Total time from sign-in until discharge is approximately 2-3 hours.  You should go home directly after your procedure and rest.  You can resume normal activities the  day after your procedure.  The day of your procedure you should not:   Drive   Make legal decisions   Operate machinery   Drink alcohol   Return to work  You will receive  specific instructions about eating, activities and medications before you leave.    The above instructions have been reviewed and explained to me by   Patient did not use the moviprep due to financial hardship.  The patient used mirilax.  Rosalita Chessman hodges, RN_______________________    I fully understand and can verbalize these instructions _____________________________ Date _________

## 2010-06-29 NOTE — Assessment & Plan Note (Signed)
Summary: EST-CK/FU/MEDS/CFB   Vital Signs:  Patient profile:   59 year old female Height:      65 inches (165.10 cm) Weight:      259.3 pounds (117.86 kg) BMI:     43.31 Temp:     98.0 degrees F (36.67 degrees C) oral Pulse rate:   75 / minute BP sitting:   132 / 73  (right arm)  Vitals Entered By: Chinita Pester RN (November 07, 2009 8:43 AM) CC: Needs another GYN referral. Had scalp cyst removed about 1 month ago. Is Patient Diabetic? No Pain Assessment Patient in pain? no      Nutritional Status BMI of > 30 = obese  Have you ever been in a relationship where you felt threatened, hurt or afraid?No   Does patient need assistance? Functional Status Self care Ambulation Normal   Primary Care Provider:  Mliss Sax MD  CC:  Needs another GYN referral. Had scalp cyst removed about 1 month ago.Marland Kitchen  History of Present Illness: 60 yo female with PMH outlined below presents to [facility] for regular follow up appointment. She has no concerns at the time. No recent sicknesses or hospitalizaitons. No episodes of chest pain, SOB, palpitations. No specific abdominal or urinary concerns. No recent changes in appetite, weight, sleep patterns, mood. She would like to have GYN referal to follow up on her fibroids.    Depression History:      The patient denies a depressed mood most of the day and a diminished interest in her usual daily activities.  The patient denies significant weight loss, significant weight gain, insomnia, hypersomnia, psychomotor agitation, psychomotor retardation, fatigue (loss of energy), feelings of worthlessness (guilt), impaired concentration (indecisiveness), and recurrent thoughts of death or suicide.  The patient denies symptoms of a manic disorder including excessive foolish business investments.        The patient denies that she feels like life is not worth living, denies that she wishes that she were dead, and denies that she has thought about ending her life.           Preventive Screening-Counseling & Management  Alcohol-Tobacco     Alcohol drinks/day: 0     Smoking Status: never  Caffeine-Diet-Exercise     Does Patient Exercise: no  Problems Prior to Update: 1)  Other Acquired Deformity of Ankle and Foot Other  (ICD-736.79) 2)  Achilles Tendinitis, Mild  (ICD-726.71) 3)  Plantar Fasciitis, Bilateral  (ICD-728.71) 4)  Urinary Frequency  (ICD-788.41) 5)  Lipoma of Unspecified Site  (ICD-214.9) 6)  Transaminases, Serum, Elevated  (ICD-790.4) 7)  Nonspecific Abn Fndng Rad & Oth Exm Skull & Head  (ICD-793.0) 8)  Cough  (ICD-786.2) 9)  Foot Pain, Bilateral  (ICD-729.5) 10)  Tinnitus, Chronic, Bilateral  (ICD-388.30) 11)  Hypertension  (ICD-401.9) 12)  Anxiety Depression  (ICD-300.4) 13)  Hypothyroidism  (ICD-244.9) 14)  Anemia, Iron Deficiency  (ICD-280.9) 15)  Obesity  (ICD-278.00) 16)  Fibroids, Uterus  (ICD-218.9) 17)  Knee Pain  (ICD-719.46) 18)  Back Pain  (ICD-724.5)  Medications Prior to Update: 1)  Levothyroxine Sodium 100 Mcg Tabs (Levothyroxine Sodium) .... Once Daily 2)  Hydrochlorothiazide 25 Mg Tabs (Hydrochlorothiazide) .... Take 1 Tablet By Mouth Daily. 3)  Omeprazole 40 Mg Cpdr (Omeprazole) .... Take 1 Tablet By Mouth Daily. 4)  Norvasc 10 Mg Tabs (Amlodipine Besylate) .... Take 1 Tablet By Mouth Daily. 5)  Nabumetone 500 Mg Tabs (Nabumetone) .Marland Kitchen.. 1 Tab By Mouth With Food Bid 6)  Tramadol  Hcl 50 Mg Tabs (Tramadol Hcl) .Marland Kitchen.. 1 Tab By Mouth Q 6 Hrs As Needed For Pain  Current Medications (verified): 1)  Levothyroxine Sodium 100 Mcg Tabs (Levothyroxine Sodium) .... Once Daily 2)  Hydrochlorothiazide 25 Mg Tabs (Hydrochlorothiazide) .... Take 1 Tablet By Mouth Daily. 3)  Omeprazole 40 Mg Cpdr (Omeprazole) .... Take 1 Tablet By Mouth Daily. 4)  Norvasc 10 Mg Tabs (Amlodipine Besylate) .... Take 1 Tablet By Mouth Daily. 5)  Nabumetone 500 Mg Tabs (Nabumetone) .Marland Kitchen.. 1 Tab By Mouth With Food Bid 6)  Tramadol Hcl 50 Mg Tabs (Tramadol  Hcl) .Marland Kitchen.. 1 Tab By Mouth Q 6 Hrs As Needed For Pain  Allergies (verified): 1)  ! Lisinopril  Past History:  Past Medical History: Last updated: 05/06/2006 Hypertension Hypothyroidism  Social History: Last updated: 05/06/2006 Occupation: Surveyor, mining Married  Risk Factors: Alcohol Use: 0 (11/07/2009) Exercise: no (11/07/2009)  Risk Factors: Smoking Status: never (11/07/2009)  Social History: Reviewed history from 05/06/2006 and no changes required. Occupation: Surveyor, mining Married Does Patient Exercise:  no  Review of Systems       per HPI  Physical Exam  General:  Well-developed,well-nourished,in no acute distress; alert,appropriate and cooperative throughout examination Lungs:  normal respiratory effort, no accessory muscle use, normal breath sounds, no crackles, and no wheezes.   Heart:  normal rate, regular rhythm, no murmur, no gallop, and no rub.   Abdomen:  soft, non-tender, and normal bowel sounds.   Neurologic:  Normal nv examination. Psych:  Oriented X3, memory intact for recent and remote, normally interactive, good eye contact, not anxious appearing, and not depressed appearing.     Impression & Recommendations:  Problem # 1:  ACHILLES TENDINITIS, MILD (ICD-726.71) Patient wants orthotics but she can not afford it, says it costs $400. I will call Dr. Su Hoff to see if he can do orthotics for her (sports medicine).   Problem # 2:  HYPERTENSION (ICD-401.9) At goal, will continue the same regimen.  Her updated medication list for this problem includes:    Hydrochlorothiazide 25 Mg Tabs (Hydrochlorothiazide) .Marland Kitchen... Take 1 tablet by mouth daily.    Norvasc 10 Mg Tabs (Amlodipine besylate) .Marland Kitchen... Take 1 tablet by mouth daily.  BP today: 132/73 Prior BP: 128/82 (10/26/2009)  Labs Reviewed: K+: 3.7 (07/26/2009) Creat: : 0.76 (07/26/2009)   Chol: 216 (03/26/2009)   HDL: 49 (03/26/2009)   LDL: 141 (03/26/2009)   TG: 132 (03/26/2009)  Problem #  3:  HYPOTHYROIDISM (ICD-244.9)  Check TSH today and readjust the dosing if indicated.  Her updated medication list for this problem includes:    Levothyroxine Sodium 100 Mcg Tabs (Levothyroxine sodium) ..... Once daily  Labs Reviewed: TSH: 3.548 (04/18/2009)    HgBA1c: 6.4 (03/25/2009) Chol: 216 (03/26/2009)   HDL: 49 (03/26/2009)   LDL: 141 (03/26/2009)   TG: 132 (03/26/2009)  Orders: T-TSH ([id number])  Problem # 4:  FIBROIDS, UTERUS (ICD-218.9)  GYN referral made, will send office notes.   Orders: Gynecologic Referral (Gyn)  Complete Medication List: 1)  Levothyroxine Sodium 100 Mcg Tabs (Levothyroxine sodium) .... Once daily 2)  Hydrochlorothiazide 25 Mg Tabs (Hydrochlorothiazide) .... Take 1 tablet by mouth daily. 3)  Omeprazole 40 Mg Cpdr (Omeprazole) .... Take 1 tablet by mouth daily. 4)  Norvasc 10 Mg Tabs (Amlodipine besylate) .... Take 1 tablet by mouth daily. 5)  Nabumetone 500 Mg Tabs (Nabumetone) .Marland Kitchen.. 1 tab by mouth with food bid 6)  Tramadol Hcl 50 Mg Tabs (Tramadol hcl) .Marland Kitchen.. 1 tab  by mouth q 6 hrs as needed for pain  Patient Instructions: 1)  Please schedule a follow-up appointment in 3 months. 2)  Please check your blood pressure regularly, if it is >170 please call clinic at [phone]  Prevention & Chronic Care Immunizations   Influenza vaccine: Not documented   Influenza vaccine deferral: Not indicated  (11/07/2009)    Tetanus booster: 07/26/2009: Tdap   Td booster deferral: Deferred  (06/01/2009)    Pneumococcal vaccine: Not documented  Colorectal Screening   Hemoccult: Not documented   Hemoccult action/deferral: Not indicated  (11/07/2009)    Colonoscopy: Not documented   Colonoscopy action/deferral: Not indicated  (11/07/2009)  Other Screening   Pap smear: Normal  (01/17/2004)   Pap smear action/deferral: GYN Referral  (07/26/2009)    Mammogram: ASSESSMENT: Negative - BI-RADS 1^MM DIGITAL SCREENING  (08/02/2009)   Mammogram action/deferral:  Ordered  (07/26/2009)   Smoking status: never  (11/07/2009)  Lipids   Total Cholesterol: 216  (03/26/2009)   Lipid panel action/deferral: Lipid Panel ordered   LDL: 141  (03/26/2009)   LDL Direct: Not documented   HDL: 49  (03/26/2009)   Triglycerides: 132  (03/26/2009)  Hypertension   Last Blood Pressure: 132 / 73  (11/07/2009)   Serum creatinine: 0.76  (07/26/2009)   Serum potassium 3.7  (07/26/2009)    Hypertension flowsheet reviewed?: Yes   Progress toward BP goal: At goal  Self-Management Support :   Personal Goals (by the next clinic visit) :      Personal blood pressure goal: 140/90  (03/25/2009)   Patient will work on the following items until the next clinic visit to reach self-care goals:     Medications and monitoring: check my blood pressure, bring all of my medications to every visit  (11/07/2009)     Eating: use fresh or frozen vegetables, eat foods that are low in salt, eat baked foods instead of fried foods, eat fruit for snacks and desserts  (11/07/2009)     Activity: take a 30 minute walk every day  (07/26/2009)    Hypertension self-management support: Written self-care plan  (11/07/2009)   Hypertension self-care plan printed.  Process Orders Check Orders Results:     [facility]: ABN not required for this insurance Order queued for requisitioning for [facility]: November 07, 2009 9:18 AM  Tests Sent for requisitioning (November 07, 2009 9:18 AM):     11/07/2009: [facility] -- T-TSH [phone] (signed)

## 2010-06-29 NOTE — Assessment & Plan Note (Signed)
Summary: ACUTE/MAGICK/REFERRAL FOR FOOT PAIN AND CYST ON HEAD NEAR FRO...   Vital Signs:  Patient profile:   60 year old female Height:      65 inches (165.10 cm) Weight:      251.3 pounds (114.23 kg) BMI:     41.97 Temp:     98.2 degrees F (36.78 degrees C) oral Pulse rate:   70 / minute BP sitting:   124 / 68  (right arm)  Vitals Entered By: Stanton Kidney Ditzler RN (July 26, 2009 9:45 AM) Is Patient Diabetic? No Pain Assessment Patient in pain? yes     Location: feet Intensity: 7 Type: aching Onset of pain  since last vs Nutritional Status BMI of > 30 = obese Nutritional Status Detail appetite good  Have you ever been in a relationship where you felt threatened, hurt or afraid?denies   Does patient need assistance? Functional Status Self care Ambulation Normal Comments Needs referral to Sports Med for feet and sug for cyst top of head. ? labs.   Primary Care Provider:  Mliss Sax MD   History of Present Illness: Patient is a 60 year old women with PMH as described in EMR.  She comes in today for:  Her foot pain which is sec to "planter fascitis" she says that she was waiting for her orange card which is done today and wants another referral to go to sportsmedicine doctor.  The cyst on her head she says is getting bigger and started aching recently, more on palpation.  Frequent urination these days, she say that it has been the case since the time she has restarted hctz, she does not complain of any burning sensation while urination, no chills or fevers noted.  No other complaints today.  Depression History:      The patient denies a depressed mood most of the day and a diminished interest in her usual daily activities.         Preventive Screening-Counseling & Management  Alcohol-Tobacco     Smoking Status: never  Caffeine-Diet-Exercise     Does Patient Exercise: yes     Type of exercise: WALKING / CARDIO  Problems Prior to Update: 1)  Urinary Frequency   (ICD-788.41) 2)  Lipoma of Unspecified Site  (ICD-214.9) 3)  Screening For Malignant Neoplasm of The Cervix  (ICD-V76.2) 4)  Special Screening For Malignant Neoplasms Colon  (ICD-V76.51) 5)  Transaminases, Serum, Elevated  (ICD-790.4) 6)  Nonspecific Abn Fndng Rad & Oth Exm Skull & Head  (ICD-793.0) 7)  Cough  (ICD-786.2) 8)  Foot Pain, Bilateral  (ICD-729.5) 9)  Tinnitus, Chronic, Bilateral  (ICD-388.30) 10)  Hypertension  (ICD-401.9) 11)  Anxiety Depression  (ICD-300.4) 12)  Hypothyroidism  (ICD-244.9) 13)  Anemia, Iron Deficiency  (ICD-280.9) 14)  Obesity  (ICD-278.00) 15)  Fibroids, Uterus  (ICD-218.9) 16)  Knee Pain  (ICD-719.46) 17)  Back Pain  (ICD-724.5)  Medications Prior to Update: 1)  Levothyroxine Sodium 100 Mcg Tabs (Levothyroxine Sodium) .... Once Daily 2)  Hydrochlorothiazide 25 Mg Tabs (Hydrochlorothiazide) .... Take 1 Tablet By Mouth Daily. 3)  Omeprazole 40 Mg Cpdr (Omeprazole) .... Take 1 Tablet By Mouth Daily. 4)  Norvasc 10 Mg Tabs (Amlodipine Besylate) .... Take 1 Tablet By Mouth Daily.  Current Medications (verified): 1)  Levothyroxine Sodium 100 Mcg Tabs (Levothyroxine Sodium) .... Once Daily 2)  Hydrochlorothiazide 25 Mg Tabs (Hydrochlorothiazide) .... Take 1 Tablet By Mouth Daily. 3)  Omeprazole 40 Mg Cpdr (Omeprazole) .... Take 1 Tablet By Mouth Daily.  4)  Norvasc 10 Mg Tabs (Amlodipine Besylate) .... Take 1 Tablet By Mouth Daily.  Allergies: 1)  ! Lisinopril  Past History:  Past Medical History: Last updated: 05/06/2006 Hypertension Hypothyroidism  Social History: Last updated: 05/06/2006 Occupation: Surveyor, mining Married  Risk Factors: Exercise: yes (07/26/2009)  Risk Factors: Smoking Status: never (07/26/2009)  Review of Systems      See HPI  Physical Exam  Additional Exam:  Gen: AOx3, morbidly obese, in no acute distress Eyes: PERRL, EOMI ENT:MMM, No erythema noted in posterior pharynx Neck: No JVD, No LAP Chest: CTAB with   good respiratory effort CVS: regular rhythmic rate, NO M/R/G, S1 S2 normal Abdo: soft,ND, BS+x4, Non tender and No hepatosplenomegaly EXT: No odema noted Neuro: Non focal, gait is normal Skin: large 2x2 flucting mass noted on the top of her head, firm consistency and tender to touch, no signs of inflammation noted   Impression & Recommendations:  Problem # 1:  URINARY FREQUENCY (ICD-788.41) Assessment New I will review UA for signs of infection since this is something new that she has noticed. No antibiotics at this time as she does not report of fever,chills or burning. Orders: T-Urinalysis ([id number])  Problem # 2:  LIPOMA OF UNSPECIFIED SITE (ICD-214.9) Surgical referral was done today for the same. Most likely needs to be resected. Orders: Surgical Referral (Surgery)  Problem # 3:  SCREENING FOR MALIGNANT NEOPLASM OF THE CERVIX (ICD-V76.2) Gynecological referral for pap smear and also she needs to follow up on her Fibroids since she has not any follow up also she tells me that she has been bleeding infrequently though she has had her menopause which is concerning for DUB (dysfunctional uterine bleeding).    Orders: Gastroenterology Referral (GI) Gynecologic Referral (Gyn)  Problem # 4:  SPECIAL SCREENING FOR MALIGNANT NEOPLASMS COLON (ICD-V76.51) Colonoscopy referral for routine check up.  Problem # 5:  TRANSAMINASES, SERUM, ELEVATED (ICD-790.4) Will obtain a Cmet today. Orders: T-Comprehensive Metabolic Panel ([id number])  Problem # 6:  FOOT PAIN, BILATERAL (ICD-729.5) Referral to sports medicine clinic. She was following with Dr fields but could not conitinue sec to insufficient funds. Orders: Sports Medicine (Sports Med)  Problem # 7:  HYPERTENSION (ICD-401.9) Please take HCTZ in the morning and Norvasc at night as it will help her counteract the diuresis issues at night and dizzyness issues while she was taking both pills together. Her updated medication list  for this problem includes:    Hydrochlorothiazide 25 Mg Tabs (Hydrochlorothiazide) .Marland Kitchen... Take 1 tablet by mouth daily.    Norvasc 10 Mg Tabs (Amlodipine besylate) .Marland Kitchen... Take 1 tablet by mouth daily.  Orders: T-Comprehensive Metabolic Panel ([id number])  BP today: 124/68 Prior BP: 155/95 (06/01/2009)  Labs Reviewed: K+: 4.4 (05/18/2009) Creat: : 0.78 (05/18/2009)   Chol: 216 (03/26/2009)   HDL: 49 (03/26/2009)   LDL: 141 (03/26/2009)   TG: 132 (03/26/2009)  Problem # 8:  FIBROIDS, UTERUS (ICD-218.9) Review in 4-6 weeks once referral is kept and seen. Orders: Gynecologic Referral (Gyn)  Problem # 9:  Preventive Health Care (ICD-V70.0) mammogram, colonoscopy, tetanus, gynae referral for Pap and fibroids  today.  Complete Medication List: 1)  Levothyroxine Sodium 100 Mcg Tabs (Levothyroxine sodium) .... Once daily 2)  Hydrochlorothiazide 25 Mg Tabs (Hydrochlorothiazide) .... Take 1 tablet by mouth daily. 3)  Omeprazole 40 Mg Cpdr (Omeprazole) .... Take 1 tablet by mouth daily. 4)  Norvasc 10 Mg Tabs (Amlodipine besylate) .... Take 1 tablet by mouth daily.  Other Orders: Tdap =>  64yrs IM ([id number]) Mammogram (Screening) (Mammo)  Patient Instructions: 1)  Please schedule a follow-up appointment in 6 months. 2)  Please schedule a follow-up appointment as needed. 3)  It is important that you exercise regularly at least 20 minutes 5 times a week. If you develop chest pain, have severe difficulty breathing, or feel very tired , stop exercising immediately and seek medical attention. 4)  You need to lose weight. Consider a lower calorie diet and regular exercise.  5)  Schedule your mammogram. 6)  Schedule a colonoscopy/sigmoidoscopy to help detect colon cancer. 7)  You need to have a Pap Smear to prevent cervical cancer. 8)  Check your Blood Pressure regularly. If it is above:140/90 you should make an appointment. Process Orders Check Orders Results:     [facility]: ABN  not required for this insurance Tests Sent for requisitioning (July 26, 2009 1:33 PM):     07/26/2009: [facility] -- T-Comprehensive Metabolic Panel [[id number]] (signed)     07/26/2009: [facility] -- T-Urinalysis [[id number]] (signed)    Prevention & Chronic Care Immunizations   Influenza vaccine: Not documented   Influenza vaccine deferral: Refused  (07/26/2009)    Tetanus booster: 07/26/2009: Tdap   Td booster deferral: Deferred  (06/01/2009)    Pneumococcal vaccine: Not documented  Colorectal Screening   Hemoccult: Not documented   Hemoccult action/deferral: Deferred  (06/01/2009)    Colonoscopy: Not documented   Colonoscopy action/deferral: GI referral  (07/26/2009)  Other Screening   Pap smear: Normal  (01/17/2004)   Pap smear action/deferral: GYN Referral  (07/26/2009)    Mammogram: Normal  (11/26/2005)   Mammogram action/deferral: Ordered  (07/26/2009)   Smoking status: never  (07/26/2009)  Lipids   Total Cholesterol: 216  (03/26/2009)   Lipid panel action/deferral: Lipid Panel ordered   LDL: 141  (03/26/2009)   LDL Direct: Not documented   HDL: 49  (03/26/2009)   Triglycerides: 132  (03/26/2009)  Hypertension   Last Blood Pressure: 124 / 68  (07/26/2009)   Serum creatinine: 0.78  (05/18/2009)   Serum potassium 4.4  (05/18/2009) CMP ordered     Hypertension flowsheet reviewed?: Yes   Progress toward BP goal: At goal  Self-Management Support :   Personal Goals (by the next clinic visit) :      Personal blood pressure goal: 140/90  (03/25/2009)   Patient will work on the following items until the next clinic visit to reach self-care goals:     Medications and monitoring: take my medicines every day, bring all of my medications to every visit  (07/26/2009)     Eating: eat more vegetables, use fresh or frozen vegetables, eat foods that are low in salt, eat baked foods instead of fried foods, eat fruit for snacks and  desserts, limit or avoid alcohol  (07/26/2009)     Activity: take a 30 minute walk every day  (07/26/2009)    Hypertension self-management support: Education handout, Written self-care plan, Resources for patients handout  (07/26/2009)   Hypertension self-care plan printed.   Hypertension education handout printed      Resource handout printed.   Nursing Instructions: Give tetanus booster today GI referral for screening colonoscopy (see order) Gyn referral for screening Pap (see order) Schedule screening mammogram (see order)    Tetanus/Td Vaccine    Vaccine Type: Tdap    Site: left deltoid    Mfr: [employer]    Dose: 0.5 ml    Route: IM    Given  by: Stanton Kidney Ditzler RN    Exp. Date: 07/23/2011    Lot #: [id number]    VIS given: 04/15/07 version given July 26, 2009.

## 2010-06-29 NOTE — Assessment & Plan Note (Signed)
Summary: PLANTAR FASCIITIS,MC   Vital Signs:  Patient profile:   60 year old female BP sitting:   138 / 81  Vitals Entered By: Lillia Pauls CMA (July 27, 2009 3:31 PM)  Primary Provider:  Mliss Sax MD   History of Present Illness: 1 year of bilateral PF pain of insidious onset. No dedicated PF stretches or rehab routine. Pain mostly along prox-med aspect of the PF. No swelling, numbness, tingling, weakness.  Wears various types of shoes. No routine exercise activity.   Allergies: 1)  ! Lisinopril  Physical Exam  General:  Well-developed,well-nourished,in no acute distress; alert,appropriate and cooperative throughout examination Msk:  Right AT: Mild swelling and ttp. No nodules.  FEET/ANKLES: FROM. Full strength. No pain. prox-med PF ttp. pes planus. excessive pronation. Morton's feet. Supinated 4th/5th toes. Neurologic:  Normal nv examination.   Impression & Recommendations:  Problem # 1:  PLANTAR FASCIITIS, BILATERAL (ICD-728.71) Assessment Unchanged  - Tennis shoes. - Comforthotics - Ice - Stretches - Handout provided - RTC in 3 wks with tennis shoes.  Her updated medication list for this problem includes:    Nabumetone 500 Mg Tabs (Nabumetone) .Marland Kitchen... 1 tab by mouth with food bid  Problem # 2:  ACHILLES TENDINITIS, MILD (ICD-726.71) On the right.  - RTC in 3 wks with tennis shoes. Will consider adding felt heel lift at that time. Will also initiate eccentric rehab protocol. - Nabumetone.  Problem # 3:  OTHER ACQUIRED DEFORMITY OF ANKLE AND FOOT OTHER (ICD-736.79)  - Per item #1.  Complete Medication List: 1)  Levothyroxine Sodium 100 Mcg Tabs (Levothyroxine sodium) .... Once daily 2)  Hydrochlorothiazide 25 Mg Tabs (Hydrochlorothiazide) .... Take 1 tablet by mouth daily. 3)  Omeprazole 40 Mg Cpdr (Omeprazole) .... Take 1 tablet by mouth daily. 4)  Norvasc 10 Mg Tabs (Amlodipine besylate) .... Take 1 tablet by mouth daily. 5)  Nabumetone  500 Mg Tabs (Nabumetone) .Marland Kitchen.. 1 tab by mouth with food bid Prescriptions: NABUMETONE 500 MG TABS (NABUMETONE) 1 tab by mouth with food BID  #60 x 0   Entered and Authorized by:   Valarie Merino MD   Signed by:   Valarie Merino MD on 07/27/2009   Method used:   Print then Give to Patient   RxID:   [id number]   Appended Document: Orders Update    Clinical Lists Changes  Orders: Added new Service order of New Patient Level II [phone]) - Signed

## 2010-06-29 NOTE — Assessment & Plan Note (Signed)
Summary: ADJUST ORTHOTICS,MC   Vital Signs:  Patient profile:   60 year old female BP sitting:   149 / 84  Vitals Entered By: Lillia Pauls CMA (May 05, 2010 10:01 AM)  Primary Care Chanika Byland:  Mliss Sax MD   History of Present Illness: 60yo female with hx of b/l plantar fasciitis & achilles tendonitis to office for f/u. Last visit fitted with custom orthotics.  Wearing these regularly & no longer having much PF pain.  Still some occasional stiffness & pain in morning. Continues to have achilles pain - worse on left compared to right.   Denies any significant swelling.  Denies any bruising.  Is doing home exercises a few times a week, but is not doing any heel raises.  Feels posting on custom orthotics causing some discomfort.  Also feels like something digging into arch of left foot at times.  Allergies: 1)  ! Lisinopril  Review of Systems      See HPI  Physical Exam  General:  Obese, in no acute distress; alert,appropriate and cooperative throughout examination Msk:  FEET: severe pes planus b/l.  Wide forefoot with transverse arch collapse. Mildly TTP along origin of PF b/l.\par Callous noted along medial great toes b/l.  Achilles TTP on left and feels thick.  No palpable nodules.  Rt achilles with minimal tenderness & does not feel thick.   Pulses:  +2/4 DP & PT b/l Neurologic:  sensation intact to light touch.   Additional Exam:  MSK U/S: L achilles thickness 0.6cm, small calcification noted at insertion.  No tears.  R achilles thickness 0.5cm, no significant calcifications, no signs of tearing.  No increased doppler flow.  Images saved.   Impression & Recommendations:  Problem # 1:  ACHILLES TENDINITIS, MILD (ICD-726.71)  - Improved on right, now more noticeable on left - MSK u/s showing no signs of tearing, but thickening of tendons b/l L>R - Emphasized need to continue home exercises.  Educated on eccentric achilles exercises.  Needs to do these on a daily  basis. - Cont. ibuprofen as needed - Cont to ice as needed - f/u 6-wks for re-evaluation, encouraged to call with questions or concerns.   Orders: [place] LIMITED ([id number])  Problem # 2:  PLANTAR FASCIITIS, BILATERAL (ICD-728.71) Assessment: Improved - Greatly improved with custom orthotics - orthotics adjusted today, first ray posts were grinded down & felt comfortable to patient.  Also grinded down small area on left arch that was bothersome.  Orthotics comfortable after adjustments.  Cont. to wear them. - cont. home exercises/stretches  Her updated medication list for this problem includes:    Ibuprofen 200 Mg Tabs (Ibuprofen) .Marland Kitchen... Take 2-3 tablets by mouth every 6 hours as needed for pain  Orders: [place] LIMITED ([id number])  Complete Medication List: 1)  Levothyroxine Sodium 100 Mcg Tabs (Levothyroxine sodium) .... Once daily 2)  Hydrochlorothiazide 25 Mg Tabs (Hydrochlorothiazide) .... Take 1 tablet by mouth daily. 3)  Norvasc 10 Mg Tabs (Amlodipine besylate) .... Take 1 tablet by mouth daily. 4)  Acetaminophen 500 Mg Caps (Acetaminophen) .... Take 1-2 tablets by mouth every 6 hours as needed for pain 5)  Ibuprofen 200 Mg Tabs (Ibuprofen) .... Take 2-3 tablets by mouth every 6 hours as needed for pain  Patient Instructions:   Orders Added: 1)  Est. Patient Level III [14782] 2)  [place] LIMITED [[id number]]

## 2010-06-29 NOTE — Procedures (Signed)
Summary: Colonoscopy  Patient: Joanna Gilbert Note: All result statuses are Final unless otherwise noted.  Tests: (1) Colonoscopy (COL)   COL Colonoscopy           DONE     [facility]     [address]     [place], [place]  [address]           COLONOSCOPY PROCEDURE REPORT           PATIENT:  Joanna Gilbert, Joanna Gilbert  MR#:  [id number]     BIRTHDATE:  06-23-1950, 58 yrs. old  GENDER:  female           ENDOSCOPIST:  Barbette Hair. Arlyce Dice, MD     Referred by:           PROCEDURE DATE:  12/30/2009     PROCEDURE:  Colon with cold biopsy polypectomy, Colonoscopy with     snare polypectomy     ASA CLASS:  Class II     INDICATIONS:  1) Routine Risk Screening           MEDICATIONS:   Fentanyl 75 mcg IV, Versed 7 mg IV           DESCRIPTION OF PROCEDURE:   After the risks benefits and     alternatives of the procedure were thoroughly explained, informed     consent was obtained.  Digital rectal exam was performed and     revealed no abnormalities.   The LB [id number] endoscope     was introduced through the anus and advanced to the cecum, which     was identified by both the appendix and ileocecal valve, without     limitations.  The quality of the prep was good, using MoviPrep.     The instrument was then slowly withdrawn as the colon was fully     examined.     <<PROCEDUREIMAGES>>           FINDINGS:  A sessile polyp was found in the mid transverse colon.     It was 3 mm in size. Polyp was snared without cautery. Retrieval     was successful (see image8). snare polyp  A sessile polyp was     found in the descending colon. It was 2 mm in size. The polyp was     removed using cold biopsy forceps (see image11).  Scattered     diverticula were found in the descending colon (see image12).     This was otherwise a normal examination of the colon (see image2,     image3, image4, image6, image10, image13, and image14).     Retroflexed views in the rectum revealed no abnormalities.     The     time to cecum =  4.0  minutes. The scope was then withdrawn (time     =  8.0  min) from the patient and the procedure completed.           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 Otherwise you should continue to follow colorectal cancer     screening guidelines for "routine risk" patients with colonoscopy     in 10 years.           REPEAT EXAM:   You will receive a letter from Dr. Arlyce Dice in 1-2     weeks, after reviewing the final pathology, with followup     recommendations.           ______________________________     Barbette Hair Arlyce Dice, MD           CC: Margarito Liner, MD           n.     Rosalie DoctorBarbette Hair. Kaplan at 12/30/2009 12:16 PM           Page 2 of 3   [place], [place], [id number]  Note: An exclamation mark (!) indicates a result that was not dispersed into the flowsheet. Document Creation Date: 12/30/2009 12:18 PM _______________________________________________________________________  (1) Order result status: Final Collection or observation date-time: 12/30/2009 12:10 Requested date-time:  Receipt date-time:  Reported date-time:  Referring Physician:   Ordering Physician: Melvia Heaps [phone]) Specimen Source:  Source: Launa Grill Order Number: [id number] Lab site:   Appended Document: Colonoscopy     Procedures Next Due Date:    Colonoscopy: 12/2014

## 2010-06-29 NOTE — Progress Notes (Signed)
Summary: refill/gg  Phone Note Refill Request  on February 01, 2010 4:40 PM  Refills Requested: Medication #1:  NORVASC 10 MG TABS Take 1 tablet by mouth daily.   Last Refilled: 12/31/2009  Method Requested: Electronic Initial call taken by: Merrie Roof RN,  February 01, 2010 4:40 PM  Follow-up for Phone Call        completed refill, thank you Iskra  Follow-up by: Mliss Sax MD,  February 02, 2010 10:06 AM    Prescriptions: NORVASC 10 MG TABS (AMLODIPINE BESYLATE) Take 1 tablet by mouth daily.  #30 x 6   Entered and Authorized by:   Mliss Sax MD   Signed by:   Mliss Sax MD on 02/02/2010   Method used:   Electronically to        [facility] [phone]* (retail)       [address]       [place], [place]  [address]       Ph: [phone]       Fax: [phone]   RxID:   [id number]

## 2010-06-29 NOTE — Letter (Signed)
Summary: Previsit letter  [facility]  [address] [place], [place] [address]   Phone: [phone]  Fax: [phone]       11/25/2009 MRN: [id number]  Joanna Gilbert [address] [place], [place]  [address]  Dear [name],  Welcome to the Gastroenterology Division at [facility].    You are scheduled to see a nurse for your pre-procedure visit on 12-16-09 at 9:00a.m. on the 3rd floor at [facility], [address]  We ask that you try to arrive at our office 15 minutes prior to your appointment time to allow for check-in.  Your nurse visit will consist of discussing your medical and surgical history, your immediate family medical history, and your medications.    Please bring a complete list of all your medications or, if you prefer, bring the medication bottles and we will list them.  We will need to be aware of both prescribed and over the counter drugs.  We will need to know exact dosage information as well.  If you are on blood thinners (Coumadin, Plavix, Aggrenox, Ticlid, etc.) please call our office today/prior to your appointment, as we need to consult with your physician about holding your medication.   Please be prepared to read and sign documents such as consent forms, a financial agreement, and acknowledgement forms.  If necessary, and with your consent, a friend or relative is welcome to sit-in on the nurse visit with you.  Please bring your insurance card so that we may make a copy of it.  If your insurance requires a referral to see a specialist, please bring your referral form from your primary care physician.  No co-pay is required for this nurse visit.     If you cannot keep your appointment, please call [phone] to cancel or reschedule prior to your appointment date.  This allows [place] the opportunity to schedule an appointment for another patient in need of care.    Thank you for choosing [facility] for your medical needs.   We appreciate the opportunity to care for you.  Please visit [place] at our website  to learn more about our practice.                     Sincerely.                                                                                                                   The Gastroenterology Division

## 2010-06-29 NOTE — Progress Notes (Signed)
Summary: Refill/gh  Phone Note Refill Request Message from:  Fax from Pharmacy on Sep 29, 2009 2:17 PM  Refills Requested: Medication #1:  NORVASC 10 MG TABS Take 1 tablet by mouth daily.   Last Refilled: 08/25/2009  Medication #2:  HYDROCHLOROTHIAZIDE 25 MG TABS Take 1 tablet by mouth daily.   Last Refilled: 08/25/2009  Medication #3:  LEVOTHYROXINE SODIUM 100 MCG TABS once daily   Last Refilled: 08/25/2009  Method Requested: Electronic Initial call taken by: Angelina Ok RN,  Sep 29, 2009 2:18 PM  Follow-up for Phone Call        completed refill, thank you Iskra  Follow-up by: Mliss Sax MD,  Sep 30, 2009 11:14 AM    Prescriptions: NORVASC 10 MG TABS (AMLODIPINE BESYLATE) Take 1 tablet by mouth daily.  #30 x 3   Entered and Authorized by:   Mliss Sax MD   Signed by:   Mliss Sax MD on 09/30/2009   Method used:   Electronically to        [facility] [phone]* (retail)       [address]       [place], [place]  [address]       Ph: [phone]       Fax: [phone]   RxID:   [id number] HYDROCHLOROTHIAZIDE 25 MG TABS (HYDROCHLOROTHIAZIDE) Take 1 tablet by mouth daily.  #30 x 3   Entered and Authorized by:   Mliss Sax MD   Signed by:   Mliss Sax MD on 09/30/2009   Method used:   Electronically to        [facility] [phone]* (retail)       [address]       [place], [place]  [address]       Ph: [phone]       Fax: [phone]   RxID:   [id number] LEVOTHYROXINE SODIUM 100 MCG TABS (LEVOTHYROXINE SODIUM) once daily  #30 x 5   Entered and Authorized by:   Mliss Sax MD   Signed by:   Mliss Sax MD on 09/30/2009   Method used:   Electronically to        [facility] [phone]* (retail)       [address]       [place], [place]  [address]       Ph: [phone]       Fax: [phone]   RxID:   [id number]

## 2010-06-29 NOTE — Assessment & Plan Note (Signed)
Summary: ? ABOUT TEST/MAGICK/DS   Vital Signs:  Patient profile:   60 year old female Height:      65 inches (165.10 cm) Weight:      257.7 pounds (117.14 kg) BMI:     43.04 Temp:     98.8 degrees F (37.11 degrees C) oral Pulse rate:   79 / minute BP sitting:   137 / 79  (right arm)  Vitals Entered By: Stanton Kidney Ditzler RN (January 06, 2010 9:58 AM) Is Patient Diabetic? No Pain Assessment Patient in pain? yes     Location: both hips Intensity: 1-2 Type: pressure Onset of pain  for months Nutritional Status BMI of > 30 = obese Nutritional Status Detail appetite good  Have you ever been in a relationship where you felt threatened, hurt or afraid?denies   Does patient need assistance? Functional Status Self care Ambulation Normal Comments Discuss fibroids and hyst. Hormones ? Wants 2nd op. Referral regarding moles.   Primary Care [place] Wins[place]:  Mliss Sax MD   History of Present Illness: Patient with PMH HTN and hypothyroidism  is here today to discuss her upcoming hysterectomy for large (grapefruit-sized, per the patient) fibroids that are causing her abdominal discomfort and pressure.  She has several questions about whether or not she will need hormone therapy after her surgery and is concerned about this because she has heard that hormone replacement therapy is not preferred.  She is unsure whether she has gone through menopause herself.  Her LMP was 2 years ago and she had intermittent light spotting for a little while after this.  The last spotting she has had was a little over a year ago.  She never noticed "hot flashes" per se, but she does get hot and sweaty at night because her husband won't let her turn on the A/C at night.  She uses a fan for this and when she sleeps at another place where she can use the A/C, she is not hot at night. She is scheduled for TAH/BSO at the end of August 2011 or the beginning of September.  She mentions that her Ob/Gyn would like to shrink her  fibroids with a hormone shot prior to surgery.  She said that they are working to get funding for this, however the surgery will go on regardless of whether she gets this hormone treatment first or not.  Patient also has complaint of many skin tags and moles over her neck and torso that irritate her. She said this is a problem that many in her family have and they have had them removed with good relief of their symptoms.  She would like to see a dermatologist for this.  Patient states that her hips are sore at times when she lays on her sides at night (she is uncomfortable lying on her back from the pressure from her fibroids), and she would like to take tylenol or ibuprofen for this as it relieves the soreness, and she wanted to know if this was safe to do.  She is taking her bp and thyroid medicines as directed.  No other complaints.  No CP, SOB, fevers.    Depression History:      The patient denies a depressed mood most of the day and a diminished interest in her usual daily activities.         Preventive Screening-Counseling & Management  Alcohol-Tobacco     Alcohol drinks/day: 0     Smoking Status: never  Caffeine-Diet-Exercise  Does Patient Exercise: no     Type of exercise: WALKING / CARDIO  Current Medications (verified): 1)  Levothyroxine Sodium 100 Mcg Tabs (Levothyroxine Sodium) .... Once Daily 2)  Hydrochlorothiazide 25 Mg Tabs (Hydrochlorothiazide) .... Take 1 Tablet By Mouth Daily. 3)  Norvasc 10 Mg Tabs (Amlodipine Besylate) .... Take 1 Tablet By Mouth Daily. 4)  Acetaminophen 500 Mg Caps (Acetaminophen) .... Take 1-2 Tablets By Mouth Every 6 Hours As Needed For Pain 5)  Ibuprofen 200 Mg Tabs (Ibuprofen) .... Take 2-3 Tablets By Mouth Every 6 Hours As Needed For Pain  Allergies: 1)  ! Lisinopril  Review of Systems       see HPI.  Physical Exam  General:  NAD Mouth:  pharynx pink and moist.   Neck:  supple and full ROM.   Lungs:  normal respiratory effort,  normal breath sounds, no crackles, and no wheezes.   Heart:  normal rate, regular rhythm, and no murmur.   Abdomen:  obese. soft and normal bowel sounds.   Skin:  multiple pendulous skin tag-type moles over the patients abdomen, back, and the back of her neck Psych:  Oriented X3, normally interactive, good eye contact, not anxious appearing, and not depressed appearing.     Impression & Recommendations:  Problem # 1:  FIBROIDS, UTERUS (ICD-218.9) The patient is scheduled for TAH at the end of August or beginning of September.  She is to follow-up with Dr. Aldine Contes after that.  She was reassured today that because she has gone through menopause, she will likely not even be offered hormone replacment therapy after surgery.  She expressed relief on hearing this.    Problem # 2:  SKIN TAG (ICD-701.9) We provided the patient with referral to dermatology for these skin tags.  Orders: Dermatology Referral (Derma)  Problem # 3:  HIP PAIN, BILATERAL (ICD-719.45) This appears to be related to the patient's positioning at night - she has no visible bruising, but is slightly sore to touch in these areas.  She was reassured and told that she could take tylenol or ibuprofen to relieve this soreness as needed.  The following medications were removed from the medication list:    Nabumetone 500 Mg Tabs (Nabumetone) .Marland Kitchen... 1 tab by mouth with food bid    Tramadol Hcl 50 Mg Tabs (Tramadol hcl) .Marland Kitchen... 1 tab by mouth q 6 hrs as needed for pain Her updated medication list for this problem includes:    Acetaminophen 500 Mg Caps (Acetaminophen) .Marland Kitchen... Take 1-2 tablets by mouth every 6 hours as needed for pain    Ibuprofen 200 Mg Tabs (Ibuprofen) .Marland Kitchen... Take 2-3 tablets by mouth every 6 hours as needed for pain  Complete Medication List: 1)  Levothyroxine Sodium 100 Mcg Tabs (Levothyroxine sodium) .... Once daily 2)  Hydrochlorothiazide 25 Mg Tabs (Hydrochlorothiazide) .... Take 1 tablet by mouth daily. 3)  Norvasc  10 Mg Tabs (Amlodipine besylate) .... Take 1 tablet by mouth daily. 4)  Acetaminophen 500 Mg Caps (Acetaminophen) .... Take 1-2 tablets by mouth every 6 hours as needed for pain 5)  Ibuprofen 200 Mg Tabs (Ibuprofen) .... Take 2-3 tablets by mouth every 6 hours as needed for pain  Patient Instructions: 1)  Please take your blood pressure and thyroid medications as directed.  2)  You may use acetaminophen or ibuprofen as needed for pain. 3)  Please follow-up with Dr. Aldine Contes after your hysterectomy, however, if you need to be seen before this, please call the clinic for an  earlier appointment. 4)  We will give you today a referral to the dermatologist.   Prevention & Chronic Care Immunizations   Influenza vaccine: Not documented   Influenza vaccine deferral: Not indicated  (11/07/2009)    Tetanus booster: 07/26/2009: Tdap   Td booster deferral: Deferred  (06/01/2009)    Pneumococcal vaccine: Not documented  Colorectal Screening   Hemoccult: Not documented   Hemoccult action/deferral: Not indicated  (11/07/2009)    Colonoscopy: DONE  (12/30/2009)   Colonoscopy action/deferral: Not indicated  (11/07/2009)   Colonoscopy due: 12/2014  Other Screening   Pap smear: Normal  (01/17/2004)   Pap smear action/deferral: GYN Referral  (07/26/2009)    Mammogram: ASSESSMENT: Negative - BI-RADS 1^MM DIGITAL SCREENING  (08/02/2009)   Mammogram action/deferral: Ordered  (07/26/2009)   Smoking status: never  (01/06/2010)  Lipids   Total Cholesterol: 216  (03/26/2009)   Lipid panel action/deferral: Lipid Panel ordered   LDL: 141  (03/26/2009)   LDL Direct: Not documented   HDL: 49  (03/26/2009)   Triglycerides: 132  (03/26/2009)  Hypertension   Last Blood Pressure: 137 / 79  (01/06/2010)   Serum creatinine: 0.76  (07/26/2009)   Serum potassium 3.7  (07/26/2009)  Self-Management Support :   Personal Goals (by the next clinic visit) :      Personal blood pressure goal: 140/90   (03/25/2009)   Patient will work on the following items until the next clinic visit to reach self-care goals:     Medications and monitoring: take my medicines every day, bring all of my medications to every visit  (01/06/2010)     Eating: drink diet soda or water instead of juice or soda, eat more vegetables, use fresh or frozen vegetables, eat foods that are low in salt, eat baked foods instead of fried foods, eat fruit for snacks and desserts, limit or avoid alcohol  (01/06/2010)     Activity: take a 30 minute walk every day  (07/26/2009)    Hypertension self-management support: Written self-care plan, Education handout, Resources for patients handout  (01/06/2010)   Hypertension self-care plan printed.   Hypertension education handout printed      Resource handout printed.

## 2010-06-29 NOTE — Miscellaneous (Signed)
Summary: Medical Surgical Procedures  Medical Surgical Procedures   Imported By: Florinda Marker 05/30/2009 15:18:35  _____________________________________________________________________  External Attachment:    Type:   Image     Comment:   External Document

## 2010-06-29 NOTE — Consult Note (Signed)
Summary: Dr. Harriette Bouillon  Dr. Harriette Bouillon   Imported By: Florinda Marker 08/22/2009 10:40:07  _____________________________________________________________________  External Attachment:    Type:   Image     Comment:   External Document

## 2010-06-29 NOTE — Progress Notes (Signed)
Summary: Questions about prep  Phone Note Call from Patient Call back at Home Phone [phone]   Caller: Patient Call For: Dr. Christella Hartigan Reason for Call: Talk to Nurse Summary of Call: pt. has some questions about her prep Initial call taken by: Karna Christmas,  December 28, 2009 9:39 AM  Follow-up for Phone Call        pt. not at home . will try later Follow-up by: Ezra Sites RN,  December 28, 2009 9:51 AM  Additional Follow-up for Phone Call Additional follow up Details #1::        Pt cannot find prep instructions.  Will come by office today for me to review instructions with her. Additional Follow-up by: Ezra Sites RN,  December 28, 2009 11:40 AM

## 2010-06-29 NOTE — Letter (Signed)
Summary: [facility] Instructions  [facility]  [address] [place], [place] [address]   Phone: [phone]  Fax: [phone]       JANETH TERRY    [birth year]    MRN: [id number]       Procedure Day /Date:  12/30/09 Friday     Arrival Time:  10:30am     Procedure Time: 11:30am     Location of Procedure:                    _x _  [facility] (4th Floor)   PREPARATION FOR COLONOSCOPY WITH MIRALAX  Starting 5 days prior to your procedure _7/31/11 _ do not eat nuts, seeds, popcorn, corn, beans, peas,  salads, or any raw vegetables.  Do not take any fiber supplements (e.g. Metamucil, Citrucel, and Benefiber). ____________________________________________________________________________________________________   THE DAY BEFORE YOUR PROCEDURE         DATE:    12/29/09  DAY:  Thursday  1   Drink clear liquids the entire day-NO SOLID FOOD  2   Do not drink anything colored red or purple.  Avoid juices with pulp.  No orange juice.  3   Drink at least 64 oz. (8 glasses) of fluid/clear liquids during the day to prevent dehydration and help the prep work efficiently.  CLEAR LIQUIDS INCLUDE: Water Jello Ice Popsicles Tea (sugar ok, no milk/cream) Powdered fruit flavored drinks Coffee (sugar ok, no milk/cream) Gatorade Juice: apple, white grape, white cranberry  Lemonade Clear bullion, consomm, broth Carbonated beverages (any kind) Strained chicken noodle soup Hard Candy  4   Mix the entire bottle of Miralax with 64 oz. of Gatorade/Powerade in the morning and put in the refrigerator to chill.  5   At 3:00 pm take 2 Dulcolax/Bisacodyl tablets.  6   At 4:30 pm take one Reglan/Metoclopramide tablet.  7  Starting at 5:00 pm drink one 8 oz glass of the Miralax mixture every 15-20 minutes until you have finished drinking the entire 64 oz.  You should finish drinking prep around 7:30 or 8:00 pm.  8   If you are nauseated, you may take the 2nd  Reglan/Metoclopramide tablet at 6:30 pm.        9    At 8:00 pm take 2 more DULCOLAX/Bisacodyl tablets.     THE DAY OF YOUR PROCEDURE      DATE:   12/29/09 DAY:  Friday  You may drink clear liquids until _ 9:30am _  (2 HOURS BEFORE PROCEDURE).   MEDICATION INSTRUCTIONS  Unless otherwise instructed, you should take regular prescription medications with a small sip of water as early as possible the morning of your procedure.  Diabetic patients - see separate instructions.   Additional medication instructions:  Hold HCTZ am of your procedure         OTHER INSTRUCTIONS  You will need a responsible adult at least 58 years of age to accompany you and drive you home.   This person must remain in the waiting room during your procedure.  Wear loose fitting clothing that is easily removed.  Leave jewelry and other valuables at home.  However, you may wish to bring a book to read or an iPod/MP3 player to listen to music as you wait for your procedure to start.  Remove all body piercing jewelry and leave at home.  Total time from sign-in until discharge is approximately 2-3 hours.  You should go home directly after your procedure and  rest.  You can resume normal activities the day after your procedure.  The day of your procedure you should not:   Drive   Make legal decisions   Operate machinery   Drink alcohol   Return to work  You will receive specific instructions about eating, activities and medications before you leave.   The above instructions have been reviewed and explained to me by   Clide Cliff, RN_______________________    I fully understand and can verbalize these instructions _____________________________ Date _______

## 2010-06-29 NOTE — Assessment & Plan Note (Signed)
Summary: F/U, PLANTAR FASCITIIS,[facility]   Vital Signs:  Patient profile:   60 year old female BP sitting:   134 / 75  Vitals Entered By: Lillia Pauls CMA (August 17, 2009 4:00 PM)  Primary Provider:  Mliss Sax MD   History of Present Illness: Taking relafen. Performing manual stretching. No reverse heel walks. Tried ice for a few days with some relief. Manual massage helps. Tried to get running shoes but could not fing the ones she wanted. wants some asics sneakers. Pain mildly decreased overall.   Allergies: 1)  ! Lisinopril  Physical Exam  General:  Well-developed,well-nourished,in no acute distress; alert,appropriate and cooperative throughout examination Msk:  Unchanged except for no AT ttp and decreased PF ttp.   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 Re-emphasized the importance of securing supportive footwear; comforthotic use; and daily rehab exercises as tolerated. - ice massage for 20 mins at the end of each day. - RTC in 4 wks with tennis shoes and comforthotics.  Problem # 2:  ACHILLES TENDINITIS, MILD (ICD-726.71) resolved.  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  Complete Medication List: 1)  Levothyroxine Sodium 100 Mcg Tabs (Levothyroxine sodium) .... Once daily 2)  Hydrochlorothiazide 25 Mg Tabs (Hydrochlorothiazide) .... Take 1 tablet by mouth daily. 3)  Omeprazole 40 Mg Cpdr (Omeprazole) .... Take 1 tablet by mouth daily. 4)  Norvasc 10 Mg Tabs (Amlodipine besylate) .... Take 1 tablet by mouth daily. 5)  Nabumetone 500 Mg Tabs (Nabumetone) .Marland Kitchen.. 1 tab by mouth with food bid

## 2010-07-10 ENCOUNTER — Encounter: Payer: Self-pay | Admitting: Internal Medicine

## 2010-08-08 LAB — CBC
HCT: 32 % — ABNORMAL LOW (ref 36.0–46.0)
MCV: 92.8 fL (ref 78.0–100.0)
Platelets: 256 10*3/uL (ref 150–400)
Platelets: 276 10*3/uL (ref 150–400)
RBC: 3.45 MIL/uL — ABNORMAL LOW (ref 3.87–5.11)
RBC: 4.29 MIL/uL (ref 3.87–5.11)
RDW: 13.2 % (ref 11.5–15.5)
RDW: 13.5 % (ref 11.5–15.5)
WBC: 10 10*3/uL (ref 4.0–10.5)
WBC: 6.3 10*3/uL (ref 4.0–10.5)

## 2010-08-08 LAB — BASIC METABOLIC PANEL
Chloride: 99 mEq/L (ref 96–112)
GFR calc Af Amer: >60 mL/min (ref >60)
GFR calc non Af Amer: >60 mL/min (ref >60)
Potassium: 3.3 mEq/L — ABNORMAL LOW (ref 3.5–5.1)

## 2010-08-08 LAB — SURGICAL PCR SCREEN
MRSA, PCR: NEGATIVE
Staphylococcus aureus: NEGATIVE

## 2010-08-16 ENCOUNTER — Other Ambulatory Visit: Payer: Self-pay | Admitting: *Deleted

## 2010-08-16 LAB — BASIC METABOLIC PANEL
BUN: 10 mg/dL (ref 6–23)
Chloride: 104 mEq/L (ref 96–112)
GFR calc non Af Amer: >60 mL/min (ref >60)
Glucose, Bld: 112 mg/dL — ABNORMAL HIGH (ref 70–99)
Potassium: 3.8 mEq/L (ref 3.5–5.1)
Sodium: 138 mEq/L (ref 135–145)

## 2010-08-16 LAB — CBC
HCT: 35.4 % — ABNORMAL LOW (ref 36.0–46.0)
Hemoglobin: 12 g/dL (ref 12.0–15.0)
MCV: 92.8 fL (ref 78.0–100.0)
Platelets: 298 10*3/uL (ref 150–400)
WBC: 7.5 10*3/uL (ref 4.0–10.5)

## 2010-08-16 LAB — DIFFERENTIAL
Eosinophils Absolute: 0.5 10*3/uL (ref 0.0–0.7)
Eosinophils Relative: 7 % — ABNORMAL HIGH (ref 0–5)
Lymphocytes Relative: 40 % (ref 12–46)
Lymphs Abs: 3 10*3/uL (ref 0.7–4.0)
Monocytes Absolute: 0.4 10*3/uL (ref 0.1–1.0)

## 2010-08-16 MED ORDER — HYDROCHLOROTHIAZIDE 25 MG PO TABS: 25.0000 mg | ORAL_TABLET | Freq: Every day | ORAL | Status: DC

## 2010-10-04 ENCOUNTER — Other Ambulatory Visit: Payer: Self-pay | Admitting: *Deleted

## 2010-10-06 MED ORDER — LEVOTHYROXINE SODIUM 100 MCG PO TABS: 100.0000 ug | ORAL_TABLET | Freq: Every day | ORAL | Status: DC

## 2010-10-10 NOTE — Op Note (Signed)
Joanna Gilbert, Joanna Gilbert               ACCOUNT NO.:  [id number]   MEDICAL RECORD NO.:  [id number]          PATIENT TYPE:  AMB   LOCATION:  DSC                          FACILITY:  [facility]   PHYSICIAN:  Robert A. Thurston Hole, M.D. DATE OF BIRTH:  05/30/1950   DATE OF PROCEDURE:  07/29/2007  DATE OF DISCHARGE:                               OPERATIVE REPORT   PREOPERATIVE DIAGNOSES:  1. 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  2. Right knee lateral meniscal cyst.   POSTOPERATIVE DIAGNOSES:  1. 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  2. Right knee lateral meniscal cyst.   PROCEDURE:  1. Right knee examination under anesthesia followed by arthroscopic      partial lateral meniscectomy.  2. Right knee chondroplasty.  3. Right knee opened lateral meniscal cyst excision.   SURGEON:  Elana Alm. Thurston Hole, M.D.   ASSISTANT:  Julien Girt, P.A.   ANESTHESIA:  General.   OPERATIVE TIME:  Forty-five minutes.   COMPLICATIONS:  None.   INDICATIONS FOR PROCEDURE:  Joanna Gilbert is a 56-year woman who has had  significant right knee pain over the past 3-4 months, getting  progressively worse.  Exam and MRI have revealed a lateral meniscus tear  with chondromalacia and a lateral meniscal cyst.  She has failed  conservative care and is now to undergo arthroscopy and cyst excision.   DESCRIPTION:  Joanna Gilbert was brought to the operating room on July 29, 2007 after a knee block was placed in the holding room by Anesthesia.  She was placed on the operative table in a supine position.  She  received Ancef 1 gram IV preoperatively for prophylaxis.  After being  placed under general anesthesia, her right knee was examined.  She had  full range of motion and her knee was stable to ligamentous exam with  normal patellar tracking.  The right leg was then prepped using sterile  DuraPrep and draped using sterile technique.  Originally, through an  anterolateral portal, the  arthroscope with a pump attachment was placed  and through an anteromedial portal, an arthroscopic probe was placed.  On initial inspection of the medial compartment, the articular cartilage  showed mild grade 1 and 2 chondromalacia; the medial meniscus was  intact.  The intercondylar notch was inspected; anterior and posterior  cruciate ligaments were normal.  Lateral compartment showed 20% grade 4  and 50% grade 3 chondromalacia on both the lateral femoral condyle and  lateral tibial plateau which was debrided, lateral meniscus tearing  posterolateral and anterior horn of which 30% to 40% was resected back  to a stable rim.  Popliteus tendon was intact.  Patellofemoral joint  showed only mild grade 1-2 chondromalacia.  The patella tracked  normally.  Moderate synovitis in the medial and lateral gutters was  debrided; otherwise, this was free of pathology.  After this was done,  then a 3-cm lateral longitudinal incision was made over the palpable  lateral meniscal cyst.  The underlying subcutaneous tissues were incised  in line with the skin incision.  The iliotibial band was incised  longitudinally over  the cyst and the cyst was carefully dissected and  removed; it measure 1 x 2 cm and was filled with a thick gritty material  and was sent for pathologic review.  The lateral joint capsule was not  entered.  After this cyst was resected, no other pathology was noted.  This wound was then closed with 2-0 Vicryl to close the IT band,  subcutaneous tissues closed with 2-0 Vicryl, subcuticular layer closed  with 4-0 Prolene.  The arthroscopic portals were closed with 3-0 nylon.  The knee injected with 0.25% Marcaine with epinephrine and 4 mg of  morphine.  Sterile dressings were applied and the patient awakened and  taken to the recovery room in stable condition.   FOLLOWUP CARE:  Joanna Gilbert will be followed as an outpatient on Vicodin  and Mobic.  I will see her back in the office in a week  for sutures out  and followup.      Robert A. Thurston Hole, M.D.  Electronically Signed     RAW/MEDQ  D:  07/29/2007  T:  07/30/2007  Job:  [id number]

## 2010-10-13 NOTE — Op Note (Signed)
Joanna Gilbert, Joanna Gilbert                           ACCOUNT NO.:  [id number]   MEDICAL RECORD NO.:  [id number]                   PATIENT TYPE:  AMB   LOCATION:  DSC                                  FACILITY:  [facility]   PHYSICIAN:  Rodney A. Chaney Malling, M.D.           DATE OF BIRTH:  Feb 23, 1951   DATE OF PROCEDURE:  02/03/2004  DATE OF DISCHARGE:                                 OPERATIVE REPORT   PREOPERATIVE DIAGNOSIS:  Tear of lateral meniscus, right knee.   POSTOPERATIVE DIAGNOSIS:  Tear of lateral meniscus, right knee.   OPERATION:  Partial meniscectomy, posterior half lateral meniscus, right  knee.   SURGEON:  Lenard Galloway. Chaney Malling, M.D.   ANESTHESIA:  MAC.   PATHOLOGY:  With the arthroscope in the knee, a very careful examination is  undertaken.  The intra-articular cartilage of the patella and the femoral  notch appeared normal.  The anterior cruciate ligament was normal.  In the  medial compartment, there was normal articular cartilage of the medial  femoral condyle, the medial tibial plateau, and the entire circumference of  medial meniscus was normal.  In the lateral compartment, there was good  cartilage over the lateral femoral condyle, but there was extensive tearing  of the posterior half of the lateral meniscus.  The anterior horn appeared  fairly normal.   DESCRIPTION OF PROCEDURE:  The patient was placed on the operating table in  the supine position.  A pneumatic tourniquet was about the right upper  thigh.  The right leg was placed in a leg holder and the entire right lower  extremity was prepped with Duraprep and draped down in the usual manner.  Marcaine was placed in the knee and Xylocaine with epinephrine were used to  infiltrate the puncture wounds.  An infusion cannula was placed in the  superior medial portal of the right knee.  The anterior medial and anterior  lateral portals were then made and the arthroscope was introduced. The  findings were described as  above.  The only significant pathology was seen  in the lateral compartment.  Through both portals, a series of baskets were  inserted and the posterior half of the lateral meniscus was debrided. This  was followed with the intra-articular shaver. All debris was removed.  The  remaining remnants were then smoothed and balance with nice transition of  the anterior one-half of the later meniscus. Excellent decompression was  achieved.  Marcaine was then placed in the knee and a large bulky pressure  dressing was applied.   The patient returned to the recovery room in excellent condition.  Technically, this went extremely well.  I was very pleased with the surgical  result.   COMPLICATIONS:  None.  Rodney A. Chaney Malling, M.D.    RAM/MEDQ  D:  02/03/2004  T:  02/03/2004  Job:  [id number]

## 2010-10-13 NOTE — Group Therapy Note (Signed)
NAMERENATE, DANH NO.:  [id number]   MEDICAL RECORD NO.:  [id number]          PATIENT TYPE:  WOC   LOCATION:  [facility]                   FACILITY:  [facility]   PHYSICIAN:  Argentina Donovan, MD        DATE OF BIRTH:  July 16, 1950   DATE OF SERVICE:                                    CLINIC NOTE   HISTORY OF PRESENT ILLNESS:  The patient is a 60 year old gravida 7, para 7-  0-0-7 with large uterine myomata diagnosed at the beginning of the year.  It  measures approximately  20.0 cm x 16.0 cm.  The patient is obese, weighing 235 pounds at 5 feet 3  inches, and the uterus could not be well outlined.  She has not had any  bleeding problems since I last saw her.  Endometrial biopsy was normal as  was a Pap smear, and the uterus was sounded to a depth of 5.0 inches.  There  has not been a significant growth since I last saw her.  However, her  symptoms have become worse with upper leg pain, extreme urinary frequency,  especially nocturia.  She is so close to menopause that I hate to put her  through a hysterectomy, and I have considered Depo-Lupron.  However, before  that, I think I will have her get a consult with interventional radiology to  see if they think she is a good candidate for uterine artery embolization.  If so, I prefer her to get that done.   IMPRESSION:  Symptomatic leiomyomata uteri.   PLAN:  Referral to interventional radiology.           ______________________________  Argentina Donovan, MD     PR/MEDQ  D:  01/11/2005  T:  01/11/2005  Job:  [id number]

## 2010-10-13 NOTE — Group Therapy Note (Signed)
NAMELAJUAN, GODBEE NO.:  [id number]   MEDICAL RECORD NO.:  [id number]          PATIENT TYPE:  WOC   LOCATION:  [facility]                   FACILITY:  [facility]   PHYSICIAN:  Argentina Donovan, MD        DATE OF BIRTH:  Dec 12, 1950   DATE OF SERVICE:  06/08/2004                                    CLINIC NOTE   The patient is a 60 year old black female gravida 7 para 7-0-0-7 with  children from the age of 40 to 59 who has recently been seen in medical  clinic and diagnosed with a large leiomyomata uteri that measures 20 x 16 x  12 cm.  Patient is somewhat obese at 231 pounds 5 feet 3 inches so the  uterus could not be well outlined.  She has had some irregular bleeding as  of lately but nothing very heavy and the periods do come and go.  Endometrial biopsy was done, the uterus sounding to 5 inches and an easy  endometrial biopsy revealed very little endometrial tissue.  We have talked  to the patient about the options and I think that this patient is so close  to menopause that there is a good chance that this uterus may over the next  few years shrink down considerably in size and not need surgical removal.  If it did need surgery I would probably suggest uterine artery embolization  because of the habitus of the patient.  At the present time she had a normal  Pap smear in May and then do a biopsy today and an ultrasound that looks  like normal leiomyomata uteri without any threatening signs.      PR/MEDQ  D:  06/08/2004  T:  06/08/2004  Job:  [id number]

## 2010-12-06 ENCOUNTER — Other Ambulatory Visit: Payer: Self-pay | Admitting: *Deleted

## 2010-12-07 MED ORDER — HYDROCHLOROTHIAZIDE 25 MG PO TABS: 25.0000 mg | ORAL_TABLET | Freq: Every day | ORAL | Status: DC

## 2011-02-06 ENCOUNTER — Other Ambulatory Visit: Payer: Self-pay | Admitting: *Deleted

## 2011-02-06 MED ORDER — AMLODIPINE BESYLATE 10 MG PO TABS: 10.0000 mg | ORAL_TABLET | Freq: Every day | ORAL | Status: DC

## 2011-02-06 NOTE — Telephone Encounter (Signed)
Must have visit and labs in next 2 weeks.  No further refills until this happens.

## 2011-02-21 ENCOUNTER — Ambulatory Visit (INDEPENDENT_AMBULATORY_CARE_PROVIDER_SITE_OTHER): Payer: Self-pay | Admitting: Internal Medicine

## 2011-02-21 ENCOUNTER — Encounter: Payer: Self-pay | Admitting: Internal Medicine

## 2011-02-21 VITALS — BP 130/75 | HR 73 | Temp 99.1°F | Wt 212.2 lb

## 2011-02-21 DIAGNOSIS — F32A Depression, unspecified: Secondary | ICD-10-CM

## 2011-02-21 DIAGNOSIS — E039 Hypothyroidism, unspecified: Secondary | ICD-10-CM

## 2011-02-21 DIAGNOSIS — F418 Other specified anxiety disorders: Secondary | ICD-10-CM | POA: Insufficient documentation

## 2011-02-21 DIAGNOSIS — I10 Essential (primary) hypertension: Secondary | ICD-10-CM

## 2011-02-21 DIAGNOSIS — Z8659 Personal history of other mental and behavioral disorders: Secondary | ICD-10-CM

## 2011-02-21 DIAGNOSIS — M1990 Unspecified osteoarthritis, unspecified site: Secondary | ICD-10-CM | POA: Insufficient documentation

## 2011-02-21 DIAGNOSIS — Z23 Encounter for immunization: Secondary | ICD-10-CM

## 2011-02-21 DIAGNOSIS — D259 Leiomyoma of uterus, unspecified: Secondary | ICD-10-CM

## 2011-02-21 HISTORY — DX: Depression, unspecified: F32.A

## 2011-02-21 HISTORY — DX: Unspecified osteoarthritis, unspecified site: M19.90

## 2011-02-21 LAB — CBC
Hemoglobin: 11.8 g/dL — ABNORMAL LOW (ref 12.0–15.0)
MCH: 29.1 pg (ref 26.0–34.0)
Platelets: 323 10*3/uL (ref 150–400)
RBC: 4.05 MIL/uL (ref 3.87–5.11)
WBC: 7.7 10*3/uL (ref 4.0–10.5)

## 2011-02-21 LAB — COMPREHENSIVE METABOLIC PANEL
Albumin: 4.4 g/dL (ref 3.5–5.2)
BUN: 12 mg/dL (ref 6–23)
CO2: 24 mEq/L (ref 19–32)
Calcium: 9.7 mg/dL (ref 8.4–10.5)
Chloride: 104 mEq/L (ref 96–112)
Glucose, Bld: 98 mg/dL (ref 70–99)
Potassium: 3.8 mEq/L (ref 3.5–5.3)
Sodium: 141 mEq/L (ref 135–145)
Total Protein: 8.1 g/dL (ref 6.0–8.3)

## 2011-02-21 MED ORDER — AMLODIPINE BESYLATE 10 MG PO TABS: 10.0000 mg | ORAL_TABLET | Freq: Every day | ORAL | Status: DC

## 2011-02-21 NOTE — Assessment & Plan Note (Signed)
Lab Results  Component Value Date   NA 136 03/29/2010   K 3.3* 03/29/2010   CL 99 03/29/2010   CO2 30 03/29/2010   BUN 6 03/29/2010   CREATININE 0.81 03/29/2010    BP Readings from Last 3 Encounters:  02/21/11 130/75  05/05/10 149/84  01/06/10 137/79    Assessment: Hypertension control:  controlled  Progress toward goals:  at goal Barriers to meeting goals:  financial need  Plan: Hypertension treatment:  continue current medications; I asked patient to check at [facility] regarding cheaper medications (amlodipine is on 3.99 list).   Will check CMET today to evaluate renal function.

## 2011-02-21 NOTE — Assessment & Plan Note (Signed)
Patient has a history of depression.  In the 1980s she did have suicidal ideation, and was on a medication, but does not recall which one.  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  She is waiting for medicaid approval.  She does feel empowered and notes that she will not let herself "feel depressed".    If we decide to start an anti-depressant, consider Cymbalta 60mg  daily (may want to start at 30mg  if worried about tolerability) given its indication for fibromyalgia and musculoskeletal pain as well.  She notes that she has been on an antidepressant in the past that she did not like because she felt like a machine & I asked that she let us know what medication that was.

## 2011-02-21 NOTE — Assessment & Plan Note (Signed)
Patient reports compliance with synthroid.  Will check TSH today given intermittent constipation & depression type symptoms.

## 2011-02-21 NOTE — Assessment & Plan Note (Addendum)
Patient has long standing history of b/l hip pain.  Imaging done in the past is as follows (also noted in problem overview):  MR b/l Hips (2009): Mild to moderate bilateral hip osteoarthritis, more notable on the right, with likely associated small area of anterior acetabular labral tearing.  MR R knee (2009): Dominant finding is marked interval progression in lateral compartment degenerative disease with new tearing in the posterior horn of the lateral meniscus. Persistent perimeniscal cyst formation related to lateral meniscal tearing as describe above MR Lumbar spine (2009) : 1. Dominant finding is advanced facet degenerative change L4-5 with small facet joint effusions and bony edema about the joints. This could be a source of back pain.  2. Negative for notable central canal and foraminal stenosis in patient with a somewhat congenitally narrowed central canal.  We considered ordering repeat MRI today, but patient noted she would be unable to get an MRI given personal finances.  She has had the orange card in the past, but is currently applying for medicaid.  Given benign physical exam findings & prior imaging results, I am okay with postponing MRI until her medicaid is approved.  She noted she would call us when approved.  For now, I asked her to try a strict tylenol/advil regimen rather than starting narcotics, given that NSAIDs are the first line agents for OA, and she has never consistently used them.  I suggested that she do the following:  Tylenol (Acetaminophen) 325-600mg  every 12 hours Advil (Ibuprofen) 200-400mg  every 12 hours alternating with tylenol When she wakes up, take tylenol (5am), about 6 hours later take advil (noon), about 6 hours later take tylenol (5pm), about 4-6 hours later take advil (before bed).  Try this for 2 weeks. - generics are okay.  She was agreeable to this plan, and will return in 2 weeks to inform us of progress.  I have also ordered a CMET to confirm that renal  and hepatic function is WNL.

## 2011-02-21 NOTE — Progress Notes (Signed)
Subjective:   Patient ID: Joanna Gilbert female   DOB: February 25, 1951 60 y.o.   MRN: [id number]  HPI: Joanna Gilbert is a 60 y.o. woman who presents today with c/o bilateral hip pain, which has been a chronic problem for years.  She notes that pain has been worsening over the last year and also has pain of b/l legs, but is unclear about if this is radiating.  She reports some numbness/tingling of both feet, but is unclear if this is a new problem & she does have history of b/l plantar fascitis.  She notes that pain is worse with lifting items & laying still.  Pain is better with moving around and percocet.  She received percocet in Nov 2011 after her total hysterectomy, and has been sparingly using it for special occassions.  She completed her supply in the last week or two.   She reports that she has tried advil, tylenol, alieve in the past with minimal improvement, but she has never tried these medications consistently.  She is unable to describe the pain at first but then upon probing, she describes pain as a constant pressure with intermittent sharp radiation.  She also reports that she had a right knee cyst excision some years ago as well as some imaging.  She denies urinary or fecal incontinence.    Patient seemed anxious/depressed, so I asked about a history of depression.  She endorses a diagnosis of depression was made in the 1980s, and she was treated with medication and group as well as individual therapy.  She doesn't remember which medication.  She did have suicidal ideation at that time, but denies suicidal ideation today.  She thought individual therapy was helpful, and has sought therapy at [facility] recently, but hasn't been seen by anyone because of finances. She endorses sad mood, increased appetite, and difficulty sleeping.  She has a difficult time falling asleep if in pain, but also doesn't sleep well once asleep.  She became teary during interview.  She does feel empowered, and will not  let herself be depressed.  She notes that her reason for living is her grandchildren and teaching them about god.    Past Medical History  Diagnosis Date  . Hypertension   . HLD (hyperlipidemia)   . Hypothyroidism    Current Outpatient Prescriptions  Medication Sig Dispense Refill  . amLODipine (NORVASC) 10 MG tablet Take 1 tablet (10 mg total) by mouth daily.  15 tablet  0  . levothyroxine (SYNTHROID, LEVOTHROID) 100 MCG tablet Take 1 tablet (100 mcg total) by mouth daily.  30 tablet  3  . DISCONTD: amLODipine (NORVASC) 10 MG tablet Take 1 tablet (10 mg total) by mouth daily.  15 tablet  0  . acetaminophen (TYLENOL) 500 MG tablet Take 1,000 mg by mouth every 6 (six) hours as needed. For pain.       . hydrochlorothiazide 25 MG tablet Take 1 tablet (25 mg total) by mouth daily.  30 tablet  11  . ibuprofen (ADVIL,MOTRIN) 200 MG tablet Take 200 mg by mouth every 6 (six) hours as needed.         Family History  Problem Relation Age of Onset  . Cancer Neg Hx   . Stroke Neg Hx    History   Social History  . Marital Status: Married    Spouse Name: N/A    Number of Children: N/A  . Years of Education: N/A   Social History Main Topics  . Smoking  status: Former Games developer  . Smokeless tobacco: Not on file  . Alcohol Use: No  . Drug Use: No  . Sexually Active: No   Review of Systems: Constitutional: Denies fever, chills, diaphoresis, and fatigue.  HEENT: Denies photophobia, eye pain, redness, hearing loss, ear pain, congestion, sore throat, rhinorrhea, sneezing, mouth sores, trouble swallowing, neck pain, neck stiffness and tinnitus.   Respiratory: Denies SOB, DOE, cough, chest tightness,  and wheezing.   Cardiovascular: Denies chest pain, palpitations and leg swelling.  Gastrointestinal: Denies nausea, vomiting, abdominal pain, diarrhea, endorses occasional constipation, denies blood in stool and abdominal distention.  Genitourinary: Denies dysuria, urgency, frequency, hematuria, flank  pain and difficulty urinating.  Musculoskeletal: Denies joint swelling and gait problem.  Skin: Denies pallor, rash and wound.  Neurological: Denies dizziness, seizures, syncope, weakness, light-headedness, and headaches.  Psychiatric/Behavioral: Denies suicidal ideation, endorses mood changes, denies confusion & nervousness, endorses sleep disturbance   Objective:  Physical Exam: Filed Vitals:   02/21/11 1512  BP: 130/75  Pulse: 73  Temp: 99.1 F (37.3 C)  TempSrc: Oral  Weight: 212 lb 3.2 oz (96.253 kg)   Constitutional: Vital signs reviewed.  Patient is an obese woman in no acute distress and cooperative with exam.   Mouth: no erythema or exudates, MMM Eyes: PERRL, EOMI, conjunctivae normal, No scleral icterus.  Neck: Supple, Trachea midline normal ROM, No mass, thyromegaly.  Cardiovascular: RRR, S1 normal, S2 normal, no MRG, pulses symmetric and intact bilaterally Pulmonary/Chest: CTAB, no wheezes, rales, or rhonchi Abdominal: Soft. Non-tender, non-distended, bowel sounds are normal, no masses, organomegaly, or guarding present.  GU: no CVA tenderness Musculoskeletal: No joint deformities, erythema, or stiffness and no nontender, negative straight leg test Neurological: A&O x3, Strength is 5-/5 and symmetric bilaterally, cranial nerve II-XII are grossly intact, no focal motor deficit, sensory intact to light touch bilaterally.  Skin: Warm, dry and intact. No rash, cyanosis, or clubbing.  Psychiatric: mildly depressed mood and affect. speech and behavior is normal. Judgment and thought content normal.  Assessment & Plan:  Case and care discussed with Dr. Meredith Pel.  Please see problem oriented charting for further details. At her next appointment, we should ask if she wants all of her prescriptions sent to [facility].   She will follow up in 2 weeks about hip pain & reassess for depression after labs return.

## 2011-02-21 NOTE — Progress Notes (Signed)
I discussed patient with resident Dr. Kapadia; I agree with the plans as outlined in her note. 

## 2011-02-21 NOTE — Patient Instructions (Addendum)
-  For your hip pain, try the following pain management for relief:  Tylenol (Acetaminophen) 325-600mg  every 12 hours Advil (Ibuprofen) 200-400mg  every 12 hours alternating with tylenol  When you wake up take tylenol (5am), about 6 hours later take advil (noon), about 6 hours later take tylenol (5pm), about 4-6 hours later take advil (before bed).  Try this for 2 weeks. - generics are okay.  -Please call us as soon as you are approved for Medicaid and we will schedule your MRI.  If pain worsens, let us know as soon as possible!  -If you develop new or worsening symptoms, please call [phone].  -We will call you with any abnormal lab results.  -Please bring all medications with you at next visit.  Thanks!

## 2011-02-21 NOTE — Assessment & Plan Note (Signed)
At discharge in Nov 2011, patient was anemic (Hb= 10.6).  This is most likely 2/2 surgery, but we will confirm with repeat CBC today.

## 2011-02-22 ENCOUNTER — Encounter: Payer: Self-pay | Admitting: Internal Medicine

## 2011-03-01 ENCOUNTER — Other Ambulatory Visit: Payer: Self-pay | Admitting: Family Medicine

## 2011-03-12 ENCOUNTER — Other Ambulatory Visit: Payer: Self-pay | Admitting: *Deleted

## 2011-03-12 MED ORDER — LEVOTHYROXINE SODIUM 100 MCG PO TABS: 100.0000 ug | ORAL_TABLET | Freq: Every day | ORAL | Status: DC

## 2011-03-12 NOTE — Telephone Encounter (Signed)
Was supposed to have returned early Oct but no appt was ever given. Will ask front to sch with PCP

## 2011-04-09 ENCOUNTER — Ambulatory Visit (INDEPENDENT_AMBULATORY_CARE_PROVIDER_SITE_OTHER): Payer: Self-pay | Admitting: Ophthalmology

## 2011-04-09 ENCOUNTER — Encounter: Payer: Self-pay | Admitting: Ophthalmology

## 2011-04-09 VITALS — BP 114/66 | HR 84 | Temp 98.3°F | Ht 65.0 in | Wt 210.0 lb

## 2011-04-09 DIAGNOSIS — I10 Essential (primary) hypertension: Secondary | ICD-10-CM

## 2011-04-09 DIAGNOSIS — Z733 Stress, not elsewhere classified: Secondary | ICD-10-CM

## 2011-04-09 DIAGNOSIS — M1990 Unspecified osteoarthritis, unspecified site: Secondary | ICD-10-CM

## 2011-04-09 DIAGNOSIS — Z658 Other specified problems related to psychosocial circumstances: Secondary | ICD-10-CM

## 2011-04-09 MED ORDER — IBUPROFEN 400 MG PO TABS: ORAL_TABLET | ORAL | Status: DC

## 2011-04-09 NOTE — Patient Instructions (Signed)
Take higher doses of advil at night before bed. Can come back before 2 months if you feel this regimen is not treating your pain. Can take benadryl as needed every 2 or three days as needed to help you sleep.

## 2011-04-09 NOTE — Progress Notes (Signed)
Subjective:   Patient ID: Joanna Gilbert female   DOB: Jun 08, 1950 60 y.o.   MRN: [id number]  HPI: [name] is a 60 y.o. woman with hypertension and long standing hip/back pain who presents for 2 month follow up and difficulty sleeping due to hip pain.  Hip pain: Is having lots of pain with trying to fall asleep. Pain is a 3 during the day, is a 14 at night. Normally sleeps on her side, has been difficult to fall asleep.  Struggles to get enough sleep- sleeping maybe 2 hours a night. Is resting a lot during the day. Has less pain without using a pillow behind her head. Pillow under the hip doesn't really help. Will put her hand under her hip and be able to fall asleep but will wake up due to pressure on her hand. Both sides hurt equally. Has been taking tylenol and advil on a regular 6 hour schedule as she was directed. Feels advil helps, but tylenol doesn't work as well. Took husband's aleeve one time, didn't think it worked. Was going to get MRI after got medicaid. Is not eligible, husband is on social security. Is going to renew orange card.  Insomnia: has history of anxiety causing problems sleeping. Never been able to sleep more than 6 hours. Will wake up around midnight.  Has taken OTC sleeping medicine in the past from her son. Just took it 2-3 times but felt it worked well. Falls asleep very quickly 'crash sleep' but then wakes up again in the middle of the night  Is trying to stay awake until 10pm so that she will not be awake at midnight. Been resting during the day- not able to nap.  BP: Amlodipine is $20.  Social: is difficult for her to care for her husband, nurse comes 2 times a week. Is interested in meeting with [facility] to see what programs she may qualify for.  Past Medical History  Diagnosis Date  . Hypertension   . HLD (hyperlipidemia)   . Hypothyroidism    Current Outpatient Prescriptions  Medication Sig Dispense Refill  . acetaminophen (TYLENOL) 500 MG  tablet Take 1,000 mg by mouth every 6 (six) hours as needed. For pain.       Marland Kitchen amLODipine (NORVASC) 10 MG tablet Take 1 tablet (10 mg total) by mouth daily.  15 tablet  0  . hydrochlorothiazide 25 MG tablet Take 1 tablet (25 mg total) by mouth daily.  30 tablet  11  . ibuprofen (ADVIL,MOTRIN) 200 MG tablet Take 200 mg by mouth every 6 (six) hours as needed.        Marland Kitchen levothyroxine (SYNTHROID, LEVOTHROID) 100 MCG tablet Take 1 tablet (100 mcg total) by mouth daily.  30 tablet  6   Family History  Problem Relation Age of Onset  . Cancer Neg Hx   . Stroke Neg Hx    History   Social History  . Marital Status: Married    Spouse Name: N/A    Number of Children: N/A  . Years of Education: N/A   Social History Main Topics  . Smoking status: Former Games developer  . Smokeless tobacco: Not on file  . Alcohol Use: No  . Drug Use: No  . Sexually Active: No   Other Topics Concern  . Not on file   Social History Narrative  . No narrative on file   Review of Systems: Constitutional: Reports fatigue.   Cardiovascular: Reports palpitations (being aware of loud  heartbeat). Denies leg swelling.  Gastrointestinal: Denies GERD or abdominal ulcer.  Musculoskeletal: Reports hip pain, back pain, gait problem.  Psychiatric/Behavioral: Denies suicidal ideation, mood changes, confusion, nervousness, sleep disturbance and agitation  Objective:  Physical Exam: Filed Vitals:   04/09/11 0959  BP: 114/66  Pulse: 84  Temp: 98.3 F (36.8 C)  TempSrc: Oral  Height: 5\' 5"  (1.651 m)  Weight: 210 lb (95.255 kg)   Constitutional: Vital signs reviewed.  Patient is a well-developed and well-nourished female in no acute distress and cooperative with exam. Head: Normocephalic and atraumatic Eyes: PERRL, EOMI, conjunctivae normal, No scleral icterus.  Cardiovascular: RRR, S1 normal, S2 normal, no MRG, pulses symmetric and intact bilaterally Pulmonary/Chest: CTAB, no wheezes, rales, or rhonchi Abdominal: Soft.  Non-tender, non-distended, bowel sounds are normal, no masses, organomegaly, or guarding present.   Neurological: A&O x3, Strenght is normal and symmetric bilaterally, cranial nerve II-XII are grossly intact Skin: Warm, dry and intact. No rash, cyanosis, or clubbing.  Psychiatric: Normal mood and affect. speech and behavior is normal. Judgment and thought content normal. Cognition and memory are normal.   Assessment & Plan:

## 2011-04-09 NOTE — Assessment & Plan Note (Addendum)
Patient has been following regularly scheduled tylenol and advil with reasonable control of her daytime pain but she has extreme pain with trying to fall asleep at night. The dose of advil that she has been taking is very low, 200mg  tablets. Prescribed her 400mg  tablets and told her to take 1-2 tablets before bedtime to give her a therapeutic dose. If she does not improve on this dose, could consider ultram at next visit but it may be a cost issue, is not on any 4$ list. She responded well to percocet after (knee surgery) but felt strange with taking vicodin. Could also consider having her order a specialty knee pillow to change pressure points at her hip.

## 2011-04-09 NOTE — Assessment & Plan Note (Signed)
Patient's blood pressure is very well controlled on HCTZ and amlodipine. Amlodipine is $20 and is not on [facility] list so we are stopping it today and will have patient follow up in 2 months and see if she is adequately controlled only on the HCTZ. If she is not well controlled, we would start either diltiazem or bisoprolol, both of which are on the 4$ [facility] list.

## 2011-04-11 ENCOUNTER — Encounter: Payer: Self-pay | Admitting: Licensed Clinical Social Worker

## 2011-04-11 ENCOUNTER — Telehealth: Payer: Self-pay | Admitting: Licensed Clinical Social Worker

## 2011-04-11 NOTE — Telephone Encounter (Signed)
Number on chart is invalid.  Will send letter

## 2011-05-03 NOTE — Telephone Encounter (Signed)
Also tried main phone number again that's listed on our chart and got a message but it sounded like a young person.  Left message on that VM as well.

## 2011-10-27 ENCOUNTER — Other Ambulatory Visit: Payer: Self-pay | Admitting: Internal Medicine

## 2012-01-08 ENCOUNTER — Other Ambulatory Visit: Payer: Self-pay | Admitting: *Deleted

## 2012-01-08 MED ORDER — HYDROCHLOROTHIAZIDE 25 MG PO TABS: 25.0000 mg | ORAL_TABLET | Freq: Every day | ORAL | Status: DC

## 2012-07-08 ENCOUNTER — Other Ambulatory Visit: Payer: Self-pay | Admitting: *Deleted

## 2012-07-08 NOTE — Telephone Encounter (Signed)
Pt has not been seen since 2012, both phone numbers have been change or disconnected

## 2012-09-08 ENCOUNTER — Encounter: Payer: Self-pay | Admitting: Internal Medicine

## 2012-09-09 ENCOUNTER — Ambulatory Visit (INDEPENDENT_AMBULATORY_CARE_PROVIDER_SITE_OTHER): Payer: No Typology Code available for payment source | Admitting: Internal Medicine

## 2012-09-09 ENCOUNTER — Encounter: Payer: Self-pay | Admitting: Internal Medicine

## 2012-09-09 ENCOUNTER — Ambulatory Visit: Payer: No Typology Code available for payment source

## 2012-09-09 VITALS — BP 113/68 | HR 79 | Temp 98.7°F | Ht 63.0 in | Wt 244.7 lb

## 2012-09-09 DIAGNOSIS — F418 Other specified anxiety disorders: Secondary | ICD-10-CM

## 2012-09-09 DIAGNOSIS — Z0000 Encounter for general adult medical examination without abnormal findings: Secondary | ICD-10-CM

## 2012-09-09 DIAGNOSIS — E785 Hyperlipidemia, unspecified: Secondary | ICD-10-CM | POA: Insufficient documentation

## 2012-09-09 DIAGNOSIS — I10 Essential (primary) hypertension: Secondary | ICD-10-CM

## 2012-09-09 DIAGNOSIS — F341 Dysthymic disorder: Secondary | ICD-10-CM

## 2012-09-09 DIAGNOSIS — E039 Hypothyroidism, unspecified: Secondary | ICD-10-CM

## 2012-09-09 DIAGNOSIS — Z1239 Encounter for other screening for malignant neoplasm of breast: Secondary | ICD-10-CM

## 2012-09-09 DIAGNOSIS — R7309 Other abnormal glucose: Secondary | ICD-10-CM

## 2012-09-09 DIAGNOSIS — R7303 Prediabetes: Secondary | ICD-10-CM | POA: Insufficient documentation

## 2012-09-09 DIAGNOSIS — D259 Leiomyoma of uterus, unspecified: Secondary | ICD-10-CM

## 2012-09-09 LAB — COMPREHENSIVE METABOLIC PANEL
Albumin: 5 g/dL (ref 3.5–5.2)
Alkaline Phosphatase: 133 U/L — ABNORMAL HIGH (ref 39–117)
BUN: 20 mg/dL (ref 6–23)
Calcium: 10.3 mg/dL (ref 8.4–10.5)
Creat: 1.11 mg/dL — ABNORMAL HIGH (ref 0.50–1.10)
Glucose, Bld: 97 mg/dL (ref 70–99)
Potassium: 3.9 mEq/L (ref 3.5–5.3)

## 2012-09-09 LAB — LIPID PANEL
Cholesterol: 211 mg/dL — ABNORMAL HIGH (ref 0–200)
HDL: 60 mg/dL (ref >39)
Total CHOL/HDL Ratio: 3.5 Ratio
Triglycerides: 138 mg/dL (ref <150)

## 2012-09-09 LAB — CBC WITH DIFFERENTIAL/PLATELET
HCT: 39.8 % (ref 36.0–46.0)
Hemoglobin: 13.2 g/dL (ref 12.0–15.0)
Lymphs Abs: 2.3 10*3/uL (ref 0.7–4.0)
MCH: 28.7 pg (ref 26.0–34.0)
Monocytes Absolute: 0.3 10*3/uL (ref 0.1–1.0)
Monocytes Relative: 5 % (ref 3–12)
Neutro Abs: 4.2 10*3/uL (ref 1.7–7.7)
Neutrophils Relative %: 60 % (ref 43–77)
RBC: 4.6 MIL/uL (ref 3.87–5.11)

## 2012-09-09 LAB — GLUCOSE, CAPILLARY: Glucose-Capillary: 103 mg/dL — ABNORMAL HIGH (ref 70–99)

## 2012-09-09 LAB — POCT GLYCOSYLATED HEMOGLOBIN (HGB A1C): Hemoglobin A1C: 6

## 2012-09-09 MED ORDER — LEVOTHYROXINE SODIUM 100 MCG PO TABS: 100.0000 ug | ORAL_TABLET | Freq: Every day | ORAL | Status: DC

## 2012-09-09 NOTE — Patient Instructions (Signed)
General Instructions:  Please follow-up at the clinic in 1 month, at which time we will reevaluate your blood pressure, thyroid, anxiety - OR, please follow-up in the clinic sooner if needed.  There have been changes in your medications:  I have refilled your Synthroid - take it every day, 30 minutes before breakfast - do not take any other medications with this pill.  You are getting labs today, if they are abnormal I will give you a call.   If you have been started on new medication(s), and you develop symptoms concerning for allergic reaction, including, but not limited to, throat closing, tongue swelling, rash, please stop the medication immediately and call the clinic at [phone], and go to the ER.  If symptoms worsen, or new symptoms arise, please call the clinic or go to the ER.  PLEASE BRING ALL OF YOUR MEDICATIONS  IN A BAG TO YOUR NEXT APPOINTMENT   Treatment Goals:  Goals (1 Years of Data) as of 09/09/12         As of Today As of Today 04/09/11     Blood Pressure    . Blood Pressure < 140/90  113/68 150/75 114/66      Self Care Goals & Plans:  Self Care Goal 09/09/2012  Manage my medications take my medicines as prescribed; bring my medications to every visit  Monitor my health keep track of my weight  Eat healthy foods eat baked foods instead of fried foods; eat foods that are low in salt; eat smaller portions  Be physically active find an activity I enjoy    LIFESTYLE TIPS TO HELP WITH YOUR BLOOD PRESSURE CONTROL  WEIGHT REDUCTION:  Strategies: A healthy weight loss program includes:  A calorie restricted diet based on individual calorie needs.   Increased physical activity (exercise).  An exercise program is just as important as the right low-calorie diet.    An unhealthy weight loss program includes:  Fasting.   Fad diets.   Supplements and drugs.  These choices do not succeed in long-term weight control.   Home Care Instructions: To help you  make the needed dietary changes:   Exercise and perform physical activity as directed by your caregiver.   Keep a daily record of everything you eat. There are many free websites to help you with this. It may be helpful to measure your foods so you can determine if you are eating the correct portion sizes.   Use low-calorie cookbooks or take special cooking classes.   Avoid alcohol. Drink more water and drinks with no calories.   Take vitamins and supplements only as recommended by your caregiver.   Weight loss support groups, Registered Dieticians, counselors, and stress reduction education can also be very helpful.   ________________________________________________________________________  DASH DIET:  The DASH diet stands for "Dietary Approaches to Stop Hypertension." It is a healthy eating plan that has been shown to reduce high blood pressure (hypertension) in as little as 14 days, while also possibly providing other significant health benefits. These other health benefits include reducing the risk of breast cancer after menopause and reducing the risk of type 2 diabetes, heart disease, colon cancer, and stroke. Health benefits also include weight loss and slowing kidney failure in patients with chronic kidney disease.   Diet guidelines: Limit salt (sodium). Your diet should contain less than 1500 mg of sodium daily.  Limit refined or processed carbohydrates. Your diet should include mostly whole grains. Desserts and added sugars should be used  sparingly.  Include small amounts of heart-healthy fats. These types of fats include nuts, oils, and tub margarine. Limit saturated and trans fats. These fats have been shown to be harmful in the body.   Choosing Foods: The following food groups are based on a 2000 calorie diet. See your Registered Dietitian for individual calorie needs.  Grains and Grain Products (6 to 8 servings daily)  Eat More Often: Whole-wheat bread, brown rice,  whole-grain or wheat pasta, quinoa, popcorn without added fat or salt (air popped).  Eat Less Often: White bread, white pasta, white rice, cornbread.  Vegetables (4 to 5 servings daily)  Eat More Often: Fresh, frozen, and canned vegetables. Vegetables may be raw, steamed, roasted, or grilled with a minimal amount of fat.  Eat Less Often/Avoid: Creamed or fried vegetables. Vegetables in a cheese sauce.  Fruit (4 to 5 servings daily)  Eat More Often: All fresh, canned (in natural juice), or frozen fruits. Dried fruits without added sugar. One hundred percent fruit juice ( cup [237 mL] daily).  Eat Less Often: Dried fruits with added sugar. Canned fruit in light or heavy syrup.  [employer], Fish, and Poultry (2 servings or less daily. One serving is 3 to 4 oz [85-114 g]).  Eat More Often: Ninety percent or leaner ground beef, tenderloin, sirloin. Round cuts of beef, chicken breast, [place] breast. All fish. Grill, bake, or broil your meat. Nothing should be fried.  Eat Less Often/Avoid: Fatty cuts of meat, [place], or chicken leg, thigh, or wing. Fried cuts of meat or fish.  Dairy (2 to 3 servings)  Eat More Often: Low-fat or fat-free milk, low-fat plain or light yogurt, reduced-fat or part-skim cheese.  Eat Less Often/Avoid: Milk (whole, 2%, skim, or chocolate). Whole milk yogurt. Full-fat cheeses.  Nuts, Seeds, and Legumes (4 to 5 servings per week)  Eat More Often: All without added salt.  Eat Less Often/Avoid: Salted nuts and seeds, canned beans with added salt.  Fats and Sweets (limited)  Eat More Often: Vegetable oils, tub margarines without trans fats, sugar-free gelatin. Mayonnaise and salad dressings.  Eat Less Often/Avoid: Coconut oils, palm oils, butter, stick margarine, cream, half and half, cookies, candy, pie.

## 2012-09-09 NOTE — Assessment & Plan Note (Signed)
Pertinent Labs: Lab Results  Component Value Date   HGBA1C 6.0 09/09/2012   HGBA1C 6.4 03/25/2009   CREATININE 0.73 02/21/2011   CREATININE 0.81 03/29/2010   CHOL 216* 03/26/2009   HDL 49 03/26/2009   TRIG 132 03/26/2009    Assessment: Progress toward goals:  Improved - A1c 6.4 in 2012, now 6  Barriers to meeting goals: no barriers identified    Patient is not currently on any prescribed medications.   Plan: Glucometer log was not reviewed today, as pt did not have glucometer available for review.   A1c was checked today, as above.  No new medications at this time, will need lifestyle modification first.  Continue to monitor A1c yearly.

## 2012-09-09 NOTE — Assessment & Plan Note (Addendum)
Pertinent Labs: Lab Results  Component Value Date   TSH 1.666 02/21/2011   FREET4 0.90 04/18/2009   Assessment: Patient is currently supposed to be taking on Levothyroxine 100 mcg daily, and was previously well controlled on this medication when taking it regularly in 2012. However, recently, she has been off x 1 month because she has not been following up in clinic. She is symptomatic at this time - see HPI.  Plan:      Restart Synthroid 100 mcg today.  Recheck TFTs at next visit in 4-6 weeks to assess if she needs further adjustment of her dosage.

## 2012-09-09 NOTE — Assessment & Plan Note (Signed)
Health Maintenance  Topic Date Due  . Mammogram  08/03/2011  . Influenza Vaccine  01/26/2013  . Colonoscopy  12/31/2014  . Tetanus/tdap  07/27/2019    Assessment:  Procedures due: mammogram  Labs due: CMET, CBC, TSH, lipid panel  Immunizations due: None  Plan:  Labs today.  Referral for mammogram placed today.

## 2012-09-09 NOTE — Assessment & Plan Note (Signed)
Pertinent Labs: Liver Function Tests:    Component Value Date/Time   AST 19 02/21/2011 1657   ALT 14 02/21/2011 1657   ALKPHOS 104 02/21/2011 1657   BILITOT 0.3 02/21/2011 1657   PROT 8.1 02/21/2011 1657   ALBUMIN 4.4 02/21/2011 1657    Lipid Panel:     Component Value Date/Time   CHOL 216* 03/26/2009 0114   TRIG 132 03/26/2009 0114   HDL 49 03/26/2009 0114   CHOLHDL 4.4 Ratio 03/26/2009 0114   VLDL 26 03/26/2009 0114   LDLCALC 141* 03/26/2009 0114     Assessment: Goal LDL (per ATP guidelines): < 130 mg/dL (in setting of family early CAD and age)  Disease Control: not controlled  Progress toward goals: unable to assess  Barriers to meeting goals: no barriers identified    Patient is not fasting today - has eaten [place] sausage and cream of wheat.   Patient is not on any prescribed medications at present.   Plan:  Lipid panel today, initiate medication if indicated.

## 2012-09-09 NOTE — Progress Notes (Addendum)
Patient: Joanna Gilbert   MRN: [id number]  DOB: 13-May-1951  PCP: Christen Bame, MD   Subjective:    HPI: Joanna Gilbert is a 62 y.o. female with a PMHx of as outlined below, who presented to clinic today to re-establish care with [facility] after long hiatus (since 2012) and for the following:  1) HTN - Patient does not check blood pressure regularly at home. Currently taking no medications - seems per last visit in 03/2011, she was supposed to be on HCTZ 25mg . Amlodipine 10mg  was stopped bc pt could not afford and she had optimally controlled BPs on just the single agent. denies headaches, dizziness, lightheadedness, chest pain, shortness of breath.    2) HLD - currently taking no medications - has not been previously on medications per report.  denies chest pain, difficulty breathing, palpitations, tachycardia, and muscle pains.  3) Hypothyroidism - Patient currently taking Levothyroxine 100 mcg, but has been out x 1 month. Current symptoms: diarrhea, nervousness and weight changes. Patient denies heat / cold intolerance. Symptoms have gradually worsened.   4) Prediabetes -  Lab Results  Component Value Date   HGBA1C 6.4 03/25/2009   Patient checking blood sugars 0 times daily. admits to chronic polyuria, polydipsia. Denies nausea, vomiting, abdominal pain.  5) Anxiety / Depression - is not on any medications - says symptoms have been persistent for > 10 years. Associated symptoms include:psychomotor agitation (gets loose stools), very anxious, frustrated, moodiness. Denies associated suicidal ideation, homicidal ideation, anhedonia, depressed mood, feelings of worthlessness/guilt and hopelessness. Currently, the patient does not follow with mental health services. Multiple stressors including husband with cancer, multiple children with multiple issues.    Review of Systems: Constitutional:  admits to fatigue Denies fever, chills, diaphoresis, appetite change.  HEENT: denies photophobia,  eye pain, redness, hearing loss, ear pain, congestion, sore throat, rhinorrhea, sneezing, neck pain, neck stiffness and tinnitus.  Respiratory: denies SOB, DOE, cough, chest tightness, and wheezing.  Cardiovascular: denies chest pain, palpitations and leg swelling.  Gastrointestinal: admits to diarrhea when stressed. Denies nausea, vomiting, abdominal pain, constipation, blood in stool.  Genitourinary: denies dysuria, urgency, frequency, hematuria, flank pain and difficulty urinating.  Musculoskeletal: admits to chronic hip pain. Denies myalgias, back pain, and gait problem. No joint swelling, redness, warmth.  Skin: denies pallor, rash and wound.  Neurological: denies dizziness, seizures, syncope, weakness, light-headedness, numbness and headaches.   Hematological: denies adenopathy, easy bruising, personal or family bleeding history.  Psychiatric/ Behavioral: admits to anxiety, insomnia. Denies suicidal ideation, mood changes, confusion, nervousness, and agitation.      Current Outpatient Medications: Medication Sig  . ibuprofen (ADVIL,MOTRIN) 400 MG tablet Take 1-2 tablets as need for pain at bedtime  . levothyroxine (SYNTHROID, LEVOTHROID) 100 MCG tablet TAKE ONE TABLET BY MOUTH EVERY DAY    Allergies  Allergen Reactions  . Asa (Aspirin)     Pt states ASA triggers her "allergies"  . Lisinopril     REACTION: cough    Past Medical History  Diagnosis Date  . Hypertension   . Hypothyroidism   . Fibroid uterus 05/06/2006    Nov 2011: total abdominal hysterectomy with b/l salpingoopherectomy by Dr. Catalina Antigua for symptomatic fibroids.  Qualifier: Diagnosis of  By: Luiz Iron MD, Benjie Karvonen    . Osteoarthritis 02/21/2011    MR b/l Hips (2009): Mild to moderate bilateral hip osteoarthritis, more notable on the right, with likely associated small area of anterior acetabular labral tearing.  MR R knee (2009): Dominant finding  is marked interval progression in lateral compartment degenerative  disease with new tearing in the posterior horn of the lateral meniscus. Persistent perimeniscal cyst formation related to lateral meniscal tearing as describe above MR Lumbar spine (2009) : 1. Dominant finding is advanced facet degenerative change L4-5 with small facet joint effusions and bony edema about the joints. This could be a source of back pain.  2. Negative for notable central canal and foraminal stenosis in patient with a somewhat congenitally narrowed central canal.    . Depression 02/21/2011  . Hyperlipidemia   . Prediabetes   . Plantar fasciitis, bilateral   . History of colonic polyps   . Diverticulosis     Past Surgical History  Procedure Laterality Date  . Total abdominal hysterectomy w/ bilateral salpingoophorectomy  03/2010    because of symptomatic fibroids, Dr. Catalina Antigua  . Knee surgery Right 2007, 2009    Had cyst removed  . Gastric restriction surgery  1980  . Cholecystectomy      Family History  Problem Relation Age of Onset  . Alcohol abuse Mother   . Heart disease Mother 62    required CABG  . Hypothyroidism Father   . Arthritis Sister   . Hypertension Sister      Social History History   Social History  . Marital Status: Married    Spouse Name: N/A    Number of Children: 2  . Years of Education: college   Occupational History  . unemployed     used to work as a Surveyor, mining   Social History Main Topics  . Smoking status: Never Smoker   . Smokeless tobacco: Never Used  . Alcohol Use: Yes  . Drug Use: No  . Sexually Active: No   Other Topics Concern  . None   Social History Narrative   From [place], has lived in [place] since 1993 and lives with her husband. Can read and write fluently in [place].     Objective:    Physical Exam: Filed Vitals:   09/09/12 1143  BP: 113/68  Pulse: 79  Temp:      General: Vital signs reviewed and noted. Well-developed, well-nourished, in no acute distress; alert, appropriate and  cooperative throughout examination.  Head: Normocephalic, atraumatic.  Eyes: conjunctivae/corneas clear. PERRL, EOM's intact.  Ears: TM nonerythematous, not bulging, good light reflex bilaterally.  Nose: Mucous membranes moist, not inflammed, nonerythematous.  Throat: Oropharynx nonerythematous, no exudate appreciated. Poor dentition.  Neck: No deformities, masses, or tenderness noted.  Lungs:  Normal respiratory effort. Clear to auscultation BL without crackles or wheezes.  Heart: RRR. S1 and S2 normal without gallop, rubs. No murmur.  Abdomen:  BS normoactive. Soft, Nondistended, non-tender.  No masses or organomegaly.  Extremities: No pretibial edema.  Neurologic: A&O X3, CN II - XII are grossly intact. Motor strength is 5/5 in the all 4 extremities, Sensations intact to light touch, Cerebellar signs negative.     Assessment/ Plan:   [name] case and plan of care was discussed with attending physician, Dr. Margarito Liner.

## 2012-09-09 NOTE — Progress Notes (Signed)
I discussed the patient with resident Dr. Kalia-Reynolds at the time of the visit, and I reviewed the history, findings, diagnosis, and treatment plans as outlined in the resident's note. I agree with the plans as outlined in her note. 

## 2012-09-09 NOTE — Assessment & Plan Note (Addendum)
Assessment:  Long standing issue since 1980s. Pt is not currently taking any medications. States that she gets nervous, frustrated particularly with family stressors, and she will have loose stools in association with those stressful episodes (but not on a regular basis otherwise). She states that she has previously been on medications, however she cannot recall the name - she does remember having slowed heart rate and feeling of being like a machine when on medications in the past.   We had a long discussion about considering counseling services, given her multiple life stressors. However, the patient indicates that she is very spiritual and she would prefer to pray and talk with cod as opposed to with a counselor, who she feels would only tell her things that she already knows.  She further refuses initiation of any medications at this point. Denies SI/HI.   Plan:  Continue to monitor symptoms.   Consider mental health referral for counseling services if patient agreeable and next visit.

## 2012-09-09 NOTE — Assessment & Plan Note (Signed)
Pertinent Data: BP Readings from Last 3 Encounters:  09/09/12 113/68  04/09/11 114/66  02/21/11 130/75    Basic Metabolic Panel:    Component Value Date/Time   NA 141 02/21/2011 1657   K 3.8 02/21/2011 1657   CL 104 02/21/2011 1657   CO2 24 02/21/2011 1657   BUN 12 02/21/2011 1657   CREATININE 0.73 02/21/2011 1657   CREATININE 0.81 03/29/2010 1117   GLUCOSE 98 02/21/2011 1657   CALCIUM 9.7 02/21/2011 1657    Assessment: Disease Control: controlled  Progress toward goals: at goal  Barriers to meeting goals: lack of followup, obesity    The patient was previously on both amlodipine and hydrochlorothiazide. In November 2012, she was stopped of her amlodipine because she was not taking it regularly and her blood pressures were well maintained off this medication. The patient informs me today that she has also been off of her hydrochlorothiazide for as long she can remember.  Therefore, her blood pressures today are indicative of those off of any medication.     Plan:  Continue to monitor at next visit if she needs initiation of antihypertensive. In which case, may consider restarting hydrochlorothiazide.  Encouraged for weight loss and diet modification.  Educational resources provided: brochure;video  Self management tools provided: home blood pressure logbook

## 2012-09-10 NOTE — Progress Notes (Signed)
Quick Note:  LDL at goal for her. Starting to develop renal impairment, possibly from hx of uncontrolled HTN? WIll consider further workup next appt including microalb/cr and recheck BMET. ______

## 2012-09-15 ENCOUNTER — Encounter: Payer: Self-pay | Admitting: *Deleted

## 2012-09-17 ENCOUNTER — Ambulatory Visit (HOSPITAL_COMMUNITY)
Admission: RE | Admit: 2012-09-17 | Discharge: 2012-09-17 | Disposition: A | Discharge status: Home / Self Care | Payer: No Typology Code available for payment source | Source: Ambulatory Visit | Attending: Internal Medicine | Admitting: Internal Medicine

## 2012-09-17 DIAGNOSIS — Z1231 Encounter for screening mammogram for malignant neoplasm of breast: Secondary | ICD-10-CM | POA: Insufficient documentation

## 2012-09-17 DIAGNOSIS — Z1239 Encounter for other screening for malignant neoplasm of breast: Secondary | ICD-10-CM

## 2012-09-18 ENCOUNTER — Other Ambulatory Visit: Payer: Self-pay | Admitting: Internal Medicine

## 2012-09-18 DIAGNOSIS — R928 Other abnormal and inconclusive findings on diagnostic imaging of breast: Secondary | ICD-10-CM

## 2012-10-17 ENCOUNTER — Ambulatory Visit
Admission: RE | Admit: 2012-10-17 | Discharge: 2012-10-17 | Disposition: A | Discharge status: Home / Self Care | Payer: No Typology Code available for payment source | Source: Ambulatory Visit | Attending: Internal Medicine | Admitting: Internal Medicine

## 2012-10-17 ENCOUNTER — Ambulatory Visit (INDEPENDENT_AMBULATORY_CARE_PROVIDER_SITE_OTHER): Payer: No Typology Code available for payment source | Admitting: Internal Medicine

## 2012-10-17 VITALS — BP 136/73 | HR 82 | Temp 98.6°F | Ht 63.0 in | Wt 249.3 lb

## 2012-10-17 DIAGNOSIS — K089 Disorder of teeth and supporting structures, unspecified: Secondary | ICD-10-CM

## 2012-10-17 DIAGNOSIS — R928 Other abnormal and inconclusive findings on diagnostic imaging of breast: Secondary | ICD-10-CM

## 2012-10-17 DIAGNOSIS — K0889 Other specified disorders of teeth and supporting structures: Secondary | ICD-10-CM

## 2012-10-17 NOTE — Progress Notes (Signed)
Subjective:   Patient ID: Joanna Gilbert female   DOB: [birth year] 62 y.o.   MRN: [id number]  HPI: [name] is a 62 y.o. African American woman with a past medical history of hypertension, hypothyroidism, who presents acutely for ongoing dental pain. The patient states that she has had lower maxillary jaw pain for about the past 2-3 months slowly progressing and getting worse. She denied any iodine aphasia or dysphasia. She has not noticed any decrease in her appetite, but has not been able to keep up with a regular diet given the sensitivity of her teeth too hard or foods. She has also sensitivity to hot and cold beverages with a sharp shooting pain along her lower mandibular jaw that is relieved once the offending agent is removed. She has had no subjective fevers/chills, nausea/vomiting/diarrhea/abdominal pain, weight loss, bad taste in her mouth, or changes in her taste sensation. She is not a smoker does not drink alcohol and uses no other illicit drugs. She has no other complaints today upon a complete review of systems. Her past medical history, family history, and social history were all reviewed with the patient.    Past Medical History  Diagnosis Date  . Hypertension   . Hypothyroidism   . Fibroid uterus 05/06/2006    Nov 2011: total abdominal hysterectomy with b/l salpingoopherectomy by Joanna Gilbert for symptomatic fibroids.  Qualifier: Diagnosis of  By: Joanna Gilbert    . Osteoarthritis 02/21/2011    MR b/l Hips (2009): Mild to moderate bilateral hip osteoarthritis, more notable on the right, with likely associated small area of anterior acetabular labral tearing.  MR R knee (2009): Dominant finding is marked interval progression in lateral compartment degenerative disease with new tearing in the posterior horn of the lateral meniscus. Persistent perimeniscal cyst formation related to lateral meniscal tearing as describe above MR Lumbar spine (2009) : 1. Dominant finding  is advanced facet degenerative change L4-5 with small facet joint effusions and bony edema about the joints. This could be a source of back pain.  2. Negative for notable central canal and foraminal stenosis in patient with a somewhat congenitally narrowed central canal.    . Depression 02/21/2011  . Hyperlipidemia   . Prediabetes   . Plantar fasciitis, bilateral   . History of colonic polyps   . Diverticulosis    Current Outpatient Prescriptions  Medication Sig Dispense Refill  . ibuprofen (ADVIL,MOTRIN) 400 MG tablet Take 1-2 tablets as need for pain at bedtime  90 tablet  3  . levothyroxine (SYNTHROID, LEVOTHROID) 100 MCG tablet Take 1 tablet (100 mcg total) by mouth daily before breakfast. 30 minutes before your meal - do not take with other medications.  30 tablet  1   No current facility-administered medications for this visit.   Family History  Problem Relation Age of Onset  . Alcohol abuse Mother   . Heart disease Mother 16    required CABG  . Hypothyroidism Father   . Arthritis Sister   . Hypertension Sister    History   Social History  . Marital Status: Married    Spouse Name: N/A    Number of Children: 2  . Years of Education: college   Occupational History  . unemployed     used to work as a Surveyor, mining   Social History Main Topics  . Smoking status: Never Smoker   . Smokeless tobacco: Never Used  . Alcohol Use: Yes  . Drug Use: No  .  Sexually Active: No   Other Topics Concern  . Not on file   Social History Narrative   From [place], has lived in [place] since 1993 and lives with her husband. Can read and write fluently in [place].   Review of Systems:otherwise negative unless listed in history of present illness  Objective:  Physical Exam: Filed Vitals:   10/17/12 1354  BP: 136/73  Pulse: 82  Temp: 98.6 F (37 C)  TempSrc: Oral  Height: 5\' 3"  (1.6 m)  Weight: 249 lb 4.8 oz (113.082 kg)  SpO2: 97%   General: sitting in chair, in no acute  distress, very pleasant HEENT: PERRL, EOMI, no scleral icterus, moist mucous membranes, no oral ulcers or lesions, lower jaw molar sensitive to touch when direct onto the molar, no surrounding erythema or purulent drainage Cardiac: RRR, no rubs, murmurs or gallops Pulm: clear to auscultation bilaterally, moving normal volumes of air Ext: warm and well perfused, no pedal edema   Assessment & Plan:  1. Dental pain: Patient does not seem to have any underlying abscess or systemic signs of infection at this time clearly seems to be localized to her molars. -Referral to dentistry made  Patient asked to followup in 3 months for regular health maintenance  Patient discussed with Joanna Gilbert.

## 2012-10-21 NOTE — Progress Notes (Signed)
Case discussed with Dr. Burtis Junes immediately after the resident saw the patient. We reviewed the resident's history and exam and pertinent patient test results. I agree with the assessment, diagnosis and plan of care documented in the resident's note.

## 2012-11-21 ENCOUNTER — Other Ambulatory Visit: Payer: Self-pay | Admitting: Internal Medicine

## 2012-12-05 ENCOUNTER — Encounter: Payer: Self-pay | Admitting: Internal Medicine

## 2012-12-05 ENCOUNTER — Telehealth: Payer: Self-pay | Admitting: *Deleted

## 2012-12-05 ENCOUNTER — Ambulatory Visit (INDEPENDENT_AMBULATORY_CARE_PROVIDER_SITE_OTHER): Payer: No Typology Code available for payment source | Admitting: Internal Medicine

## 2012-12-05 VITALS — BP 128/74 | HR 84 | Temp 97.6°F | Ht 63.5 in | Wt 245.1 lb

## 2012-12-05 DIAGNOSIS — K089 Disorder of teeth and supporting structures, unspecified: Secondary | ICD-10-CM

## 2012-12-05 DIAGNOSIS — K0889 Other specified disorders of teeth and supporting structures: Secondary | ICD-10-CM | POA: Insufficient documentation

## 2012-12-05 MED ORDER — LIDOCAINE HCL 2 % EX LIQD: 15.0000 mL | CUTANEOUS | Status: DC | PRN

## 2012-12-05 NOTE — Patient Instructions (Addendum)
Thanks for your visit. - We have gotten in touch with your dentist and they will call you for an appointment. Please follow up with them over the phone if they do not get back to you by next week. - We have prescribed you liquid Lidocaine. Please swish and SPIT the liquid out every 3 hours as needed for pain. - Take tylenol alternating with ibuprofen every 4-6 hours as needed for pain until you can see the dentist. - If your pain continues to worsen, call the clinic and I will order you something stronger.

## 2012-12-05 NOTE — Progress Notes (Signed)
  Subjective:    Patient ID: Joanna Gilbert, female    DOB: [birth year], 62 y.o.   MRN: [id number]  HPI Joanna Gilbert is a 62 y.o. African American woman with a past medical history of hypertension, hypothyroidism, who presents acutely for ongoing dental pain.  She has had tooth pain for 3-4 months. In May she was referred to dental by Dr. Burtis Junes, but apparently the clinic has not called her for an appointment. She presents today with worsening pain.  Her upper left and lower right molars hurt her the worst. She experiences shooting pains when biting on hard foods on the right, and a constant, aching pain that is sensitive to hot and cold on the left. She packs one of her left upper molar with a putty material because it is broken. Because of the pain she has been having to eat soft foods, including mashed potatoes and oodles of noodles.  She notes no swelling. No fevers or chills. No nausea or vomiting. No bad taste in her mouth or change in taste sensation. She does not smoke or use alcohol.  She has tried OTC ibuprofen and topical oragel, which provide ~3 hours of relief.   Review of Systems  Constitutional: Negative for fever, chills, diaphoresis and unexpected weight change.  HENT: Negative for sore throat and rhinorrhea.   Eyes: Negative for visual disturbance.  Respiratory: Negative for shortness of breath.   Cardiovascular: Negative for chest pain.  Gastrointestinal: Negative for nausea, vomiting and abdominal pain.  Genitourinary: Negative for dysuria.  Skin: Negative for rash.  Neurological: Negative for dizziness, weakness, numbness and headaches.       Objective:   Physical Exam  Constitutional: She is oriented to person, place, and time. She appears well-developed and well-nourished.  HENT:  Head: Normocephalic and atraumatic.  Mouth/Throat: Oropharynx is clear and moist. No oral lesions. Abnormal dentition (Missing right upper incisor. Broken R upper molar packed  with white putty (Dentemp).). No dental abscesses.  Cheeks and jaw are symmetrical. No masses or fluctuance.  Her left upper and lower molars are sensitive to direct touch with a tongue blade. No surrounding erythema, edema, or purulent discharge on the right or left.  Cardiovascular: Normal rate, regular rhythm and normal heart sounds.   Pulmonary/Chest: Effort normal and breath sounds normal.  Neurological: She is alert and oriented to person, place, and time. She has normal strength. No cranial nerve deficit or sensory deficit.          Assessment & Plan:

## 2012-12-05 NOTE — Assessment & Plan Note (Addendum)
There are no signs of infection or inflammation spread beyond her teeth. That being said, she definitely needs to be assessed by a dental professional. Joanna Gilbert called the clinic and was told they would call the patient tonight with an appointment. Per the patient, they have said this before and not followed up.  - Told her to keep her phone on at all times and call the clinic next week if she does not hear from them - Will give lidocaine swish and spit - Advised her to alternate tylenol and ibuprofen for pain control

## 2012-12-05 NOTE — Telephone Encounter (Signed)
On waiting list at Dental clinic - past few weeks dental pain getting worse and left side of face hurts. Appt today Dr Claudell Kyle 12/05/12 1:30PM - pt aware. Stanton Kidney Anesa Fronek RN 12/05/12 10:45AM

## 2012-12-08 NOTE — Progress Notes (Signed)
I saw and evaluated the patient.  I personally confirmed the key portions of the history and exam documented by Dr. Claudell Kyle and I reviewed pertinent patient test results.  The assessment, diagnosis, and plan were formulated together and I agree with the documentation in the resident's note.  Agree with Dr. Doristine Locks assessment that she needs to be seen by dental in the near future.  She does not currently have a dental abscess but does have caries and gingivitis on exam and could develop an abscess.

## 2012-12-31 ENCOUNTER — Other Ambulatory Visit: Payer: Self-pay | Admitting: Internal Medicine

## 2012-12-31 NOTE — Telephone Encounter (Signed)
Pls sch Jan appt with Dr Burtis Junes

## 2013-02-10 ENCOUNTER — Encounter: Payer: Self-pay | Admitting: Internal Medicine

## 2013-02-20 ENCOUNTER — Encounter: Payer: No Typology Code available for payment source | Admitting: Internal Medicine

## 2013-02-27 ENCOUNTER — Ambulatory Visit (INDEPENDENT_AMBULATORY_CARE_PROVIDER_SITE_OTHER): Payer: No Typology Code available for payment source | Admitting: Internal Medicine

## 2013-02-27 ENCOUNTER — Encounter: Payer: Self-pay | Admitting: Internal Medicine

## 2013-02-27 VITALS — BP 144/86 | HR 57 | Temp 97.9°F | Ht 63.0 in | Wt 242.4 lb

## 2013-02-27 DIAGNOSIS — F4321 Adjustment disorder with depressed mood: Secondary | ICD-10-CM

## 2013-02-27 DIAGNOSIS — F4320 Adjustment disorder, unspecified: Secondary | ICD-10-CM

## 2013-02-27 DIAGNOSIS — E039 Hypothyroidism, unspecified: Secondary | ICD-10-CM

## 2013-02-27 DIAGNOSIS — Z23 Encounter for immunization: Secondary | ICD-10-CM

## 2013-02-27 LAB — T4, FREE: Free T4: 1.09 ng/dL (ref 0.80–1.80)

## 2013-02-27 NOTE — Progress Notes (Signed)
Subjective:   Patient ID: Joanna Gilbert female   DOB: 09-01-1950 62 y.o.   MRN: [id number]  HPI: Joanna Gilbert is a 62 y.o. woman pmh of hypothyroidism coming in for "labs of thyroid." Pt hasn't been evaluated since 2012 for her hypothyroidism. She states compliance with her medications. She has not had any heat/cold intolerance, loss/thinning of hair, diarrhea/constipation, or rashes.   She does describe some weight gain and increased eating habits along with worsening sadness/depression. Pt denied current suicidal/homicidal ideation. She is the prmary caregiver for her husband of whom was diagnosed with Stage IV lung cancer and has entered hospice care this week. She has been having vivid dreams of his death, some nervousness, crying spells, and sleep disturbances. She continues to take care of her own personal ADLs and is able to continue to work.She has very limited support system in regards to her family and is finding some help from the hospice counselor.   Past Medical History  Diagnosis Date  . Hypertension   . Hypothyroidism   . Fibroid uterus 05/06/2006    Nov 2011: total abdominal hysterectomy with b/l salpingoopherectomy by Dr. Catalina Antigua for symptomatic fibroids.  Qualifier: Diagnosis of  By: Luiz Iron MD, Benjie Karvonen    . Osteoarthritis 02/21/2011    MR b/l Hips (2009): Mild to moderate bilateral hip osteoarthritis, more notable on the right, with likely associated small area of anterior acetabular labral tearing.  MR R knee (2009): Dominant finding is marked interval progression in lateral compartment degenerative disease with new tearing in the posterior horn of the lateral meniscus. Persistent perimeniscal cyst formation related to lateral meniscal tearing as describe above MR Lumbar spine (2009) : 1. Dominant finding is advanced facet degenerative change L4-5 with small facet joint effusions and bony edema about the joints. This could be a source of back pain.  2. Negative for  notable central canal and foraminal stenosis in patient with a somewhat congenitally narrowed central canal.    . Depression 02/21/2011  . Hyperlipidemia   . Prediabetes   . Plantar fasciitis, bilateral   . History of colonic polyps   . Diverticulosis    Current Outpatient Prescriptions  Medication Sig Dispense Refill  . ibuprofen (ADVIL,MOTRIN) 400 MG tablet Take 1-2 tablets as need for pain at bedtime  90 tablet  3  . levothyroxine (SYNTHROID, LEVOTHROID) 100 MCG tablet TAKE ONE TABLET BY MOUTH ONCE DAILY BEFORE BREAKFAST . 30 MINUTES BEFORE YOUR MEAL. ( DO NOT TAKE WIH OTHER MEDICATIONS)  30 tablet  5  . Lidocaine HCl 2 % LIQD Swish and spit 15 mLs every 3 (three) hours as needed.  1 Bottle  2   No current facility-administered medications for this visit.   Family History  Problem Relation Age of Onset  . Alcohol abuse Mother   . Heart disease Mother 50    required CABG  . Hypothyroidism Father   . Arthritis Sister   . Hypertension Sister    History   Social History  . Marital Status: Married    Spouse Name: N/A    Number of Children: 2  . Years of Education: college   Occupational History  . unemployed     used to work as a Surveyor, mining   Social History Main Topics  . Smoking status: Never Smoker   . Smokeless tobacco: Never Used  . Alcohol Use: Yes  . Drug Use: No  . Sexual Activity: No   Other Topics Concern  . None  Social History Narrative   From [place], has lived in [place] since 1993 and lives with her husband. Can read and write fluently in [place].   Review of Systems: otherwise negative unless listed in HPI   Objective:  Physical Exam: Filed Vitals:   02/27/13 1347  BP: 144/86  Pulse: 57  Temp: 97.9 F (36.6 C)  TempSrc: Oral  Height: 5\' 3"  (1.6 m)  Weight: 242 lb 6.4 oz (109.952 kg)  SpO2: 97%   General: sitting in chair, teary eyed, NAD HEENT: PERRL, EOMI, no scleral icterus Cardiac: RRR, no rubs, murmurs or gallops Pulm: clear  to auscultation bilaterally, moving normal volumes of air Abd: soft, nontender, nondistended, BS present Ext: warm and well perfused, no pedal edema Neuro: alert and oriented X3, cranial nerves II-XII grossly intact   Assessment & Plan:  1. hypothyroidism: Patient had last TSH checked back in 2012 which was normal. It is unclear if patient is having exacerbation of symptoms with some depression and increased eating habits with weight gain. -TSH  2.Grief Reaction: caregiver burn out/depression: Patient is the primary caregiver of her husband he has more advanced lung adenocarcinoma who is now under direct hospice care. Patient does not have a lot of support system and is very hesitant towards counseling at this time stating that she "will take care of herself after her husband passes." The patient denies active suicidal or homicidal ideation and does not have passing suicidal thoughts at this time. -Extensive discussion in terms of pt experiencing normal feelings of grief along with pt preference of starting medications was had with the patient and she would like to defer at this time but will make an appointment if she feels that she would like to start an SSRI/anxiolytics -Repeat referral information to the acute psychiatric help was given to the patient -Patient was told that she is able to call the clinic at any time for her primary care provider to call or make more frequent visits if that will help her with the mourning process  Pt discussed with Dr. Dalphine Handing

## 2013-03-02 NOTE — Progress Notes (Signed)
Case discussed with Dr. Sadek at the time of the visit.  We reviewed the resident's history and exam and pertinent patient test results.  I agree with the assessment, diagnosis, and plan of care documented in the resident's note. 

## 2013-03-31 ENCOUNTER — Telehealth: Payer: Self-pay | Admitting: *Deleted

## 2013-03-31 NOTE — Telephone Encounter (Signed)
Hospice nurse and pt call, speaking together. Pt's sig other is in stage of dying. Pt has in her possession vistaril and lorazepam. Hospice nurse ask if i can tell pt or get ok from pt's pcp for pt to take these meds. i ask who prescribed them for pt, hospice nurse answers that they are the sig others meds and she just wants an ok since pt is on levothyroxine. i informed nurse and pt that pt would need to be seen and obtain suitable meds from her pcp or another clinic md. The hospice nurse became very irate and said "you are not a compassionate person, she needs this" i answered "iam sorry but that is against the law and pt would need to be seen or speak to her pcp" she restated you are not compassionate and hung the phone up.

## 2013-07-14 ENCOUNTER — Other Ambulatory Visit: Payer: Self-pay | Admitting: Internal Medicine

## 2013-07-24 ENCOUNTER — Other Ambulatory Visit: Payer: Self-pay | Admitting: Internal Medicine

## 2013-09-16 ENCOUNTER — Other Ambulatory Visit: Payer: Self-pay | Admitting: Internal Medicine

## 2013-10-27 ENCOUNTER — Other Ambulatory Visit: Payer: Self-pay | Admitting: Internal Medicine

## 2013-11-24 ENCOUNTER — Other Ambulatory Visit: Payer: Self-pay | Admitting: *Deleted

## 2013-12-09 ENCOUNTER — Encounter: Payer: Self-pay | Admitting: Internal Medicine

## 2013-12-09 ENCOUNTER — Ambulatory Visit (INDEPENDENT_AMBULATORY_CARE_PROVIDER_SITE_OTHER): Payer: Self-pay | Admitting: Internal Medicine

## 2013-12-09 VITALS — BP 140/80 | HR 72 | Temp 98.7°F | Wt 258.1 lb

## 2013-12-09 DIAGNOSIS — E785 Hyperlipidemia, unspecified: Secondary | ICD-10-CM

## 2013-12-09 DIAGNOSIS — I10 Essential (primary) hypertension: Secondary | ICD-10-CM

## 2013-12-09 DIAGNOSIS — E039 Hypothyroidism, unspecified: Secondary | ICD-10-CM

## 2013-12-09 DIAGNOSIS — R7303 Prediabetes: Secondary | ICD-10-CM

## 2013-12-09 DIAGNOSIS — R7309 Other abnormal glucose: Secondary | ICD-10-CM

## 2013-12-09 MED ORDER — LEVOTHYROXINE SODIUM 100 MCG PO TABS: 100.0000 ug | ORAL_TABLET | Freq: Every day | ORAL | Status: DC

## 2013-12-09 NOTE — Progress Notes (Signed)
   Subjective:    Patient ID: Joanna Gilbert, female    DOB: 1950/10/13, 63 y.o.   MRN: [id number]  HPI Comments: Joanna Gilbert is a 63 year old woman with a history of hypertension and hypothyroidism who presents to clinic today for a refill of her Synthroid.  She hasn't been to the clinic for awhile because she has been sorting out her insurance system.  She reports running out of her Synthroid three weeks ago.  She ran out of Synthroid a couple of years ago, and she reports having memory problems, her plantar fasciitis got worse, and she gained weight over the course of a year.  She currently reports increased hunger since she stopped taking the Synthroid.  She has also gained some weight (approximately 15 pounds since last recorded weight in 10/14).  She reports some numbness in her fingers and toes, constant diaphoresis, fatigue, skin diaphoresis and constipation.  She does have some anxiety as well that she relates to having lost her husband last year, but she thinks it is improving.     Review of Systems  Constitutional: Positive for diaphoresis, activity change, appetite change and fatigue. Negative for fever and chills.  HENT: Positive for sore throat. Negative for congestion and rhinorrhea.   Eyes: Positive for pain (Right ear.).  Respiratory: Positive for wheezing. Negative for chest tightness and shortness of breath.   Cardiovascular: Positive for palpitations (Due to anxiety.). Negative for chest pain.  Gastrointestinal: Positive for diarrhea and constipation. Negative for nausea, vomiting and abdominal pain.  Endocrine: Positive for heat intolerance, polydipsia, polyphagia and polyuria. Negative for cold intolerance.  Genitourinary: Positive for frequency. Negative for hematuria and difficulty urinating.  Musculoskeletal: Positive for arthralgias and myalgias.  Skin: Negative for rash.  Neurological: Positive for light-headedness, numbness and headaches (Rare migraines in the  past.). Negative for syncope.  Psychiatric/Behavioral: Positive for dysphoric mood (Husband passed away last year.) and agitation.       Objective:   Physical Exam  Constitutional: She is oriented to person, place, and time. She appears well-developed and well-nourished. No distress.  HENT:  Head: Normocephalic and atraumatic.  Mouth/Throat: Oropharynx is clear and moist. No oropharyngeal exudate.  Acanthosis nigricans on posterior neck.  Eyes: Conjunctivae are normal. Pupils are equal, round, and reactive to light. Right eye exhibits no discharge. Left eye exhibits no discharge. No scleral icterus.  Arcus senilis.  Neck: Normal range of motion. Neck supple. No thyromegaly present.  Cardiovascular: Normal rate, regular rhythm and normal heart sounds.   Pulmonary/Chest: Effort normal and breath sounds normal. No respiratory distress. She has no wheezes. She has no rales.  Abdominal: Soft. Bowel sounds are normal. She exhibits no distension. There is no tenderness.  Musculoskeletal: Normal range of motion. She exhibits no edema.  Lymphadenopathy:    She has no cervical adenopathy.  Neurological: She is alert and oriented to person, place, and time. No cranial nerve deficit.  Skin: Skin is warm and dry. No rash noted. She is not diaphoretic. No erythema. No pallor.  Multiple skin tags.  Psychiatric: She has a normal mood and affect.          Assessment & Plan:  Please see problem-based assessment and plan.

## 2013-12-09 NOTE — Assessment & Plan Note (Addendum)
Patient has had hypothyroidism for several years and has been well-controlled on Synthroid 100 mcg daily.  Since running out of medication three weeks ago, she has developed classic hypothyroid symptoms, including fatigue, weight gain, dry skin, and constipation.  It appears that a lack of insurance contributed to her running out of medication, and she is going to try to talk to Butch Penny today to discuss next steps. -Resume Synthroid 100 mcg daily, prescription sent to pharmacy. -Return to clinic in 6-8 weeks for TSH level.

## 2013-12-09 NOTE — Assessment & Plan Note (Addendum)
History of heart disease in family and previous LDL of 141 in 2010, most recently 48.  May benefit from lipid lowering therapy. -Lipid panel in 6 months.

## 2013-12-09 NOTE — Progress Notes (Signed)
I saw and evaluated the patient.  I personally confirmed the key portions of the history and exam documented by Dr. Moding and I reviewed pertinent patient test results.  The assessment, diagnosis, and plan were formulated together and I agree with the documentation in the resident's note. 

## 2013-12-09 NOTE — Patient Instructions (Signed)
Thank you for coming to clinic today Joanna Gilbert.  General instructions: -I sent a prescription for Synthroid to your pharmacy.  Please resume taking 100 mcg every day before breakfast.  This should help your energy level and weight. -Please make a follow up appointment to return to clinic in 6 weeks, so we can check your blood work to make sure you are on the right dose of Synthroid. -Some of your symptoms are concerning for the development of diabetes. -We will need to run some labs in the next couple of months, but we will wait until your next visit.  Hopefully, you can get your insurance set up by then.  Please bring your medicines with you each time you come.   Medicines may be  Eye drops  Herbal   Vitamins  Pills  Seeing these help [place] take care of you.

## 2013-12-09 NOTE — Assessment & Plan Note (Signed)
Controlled today.  Previously took anti-hypertensives, but she has been able to control her blood pressure with diet. -Continue diet and exercise management of blood pressure.

## 2013-12-09 NOTE — Assessment & Plan Note (Addendum)
Patient has had some dizziness, polyuria, polydipsia, numbness, and acanthosis nigricans.  Her last A1C was 6 over a year ago, but she will need close follow up and monitoring to determine if she need to be on medications for diabetes in the future.  I will hold off on labs today as she tries to get her insurance sorted out and because she will need to return for TSH in 6 weeks. -A1C and BMP in 6 weeks.

## 2013-12-11 ENCOUNTER — Encounter: Payer: Self-pay | Admitting: Internal Medicine

## 2014-02-08 ENCOUNTER — Other Ambulatory Visit: Payer: Self-pay | Admitting: *Deleted

## 2014-02-08 DIAGNOSIS — E039 Hypothyroidism, unspecified: Secondary | ICD-10-CM

## 2014-02-09 MED ORDER — LEVOTHYROXINE SODIUM 100 MCG PO TABS: 100.0000 ug | ORAL_TABLET | Freq: Every day | ORAL | Status: DC

## 2014-03-05 ENCOUNTER — Ambulatory Visit (INDEPENDENT_AMBULATORY_CARE_PROVIDER_SITE_OTHER): Payer: Self-pay | Admitting: Internal Medicine

## 2014-03-05 ENCOUNTER — Encounter: Payer: Self-pay | Admitting: Internal Medicine

## 2014-03-05 VITALS — BP 154/58 | HR 68 | Temp 98.3°F | Ht 63.0 in | Wt 257.3 lb

## 2014-03-05 DIAGNOSIS — R5383 Other fatigue: Secondary | ICD-10-CM | POA: Insufficient documentation

## 2014-03-05 DIAGNOSIS — I10 Essential (primary) hypertension: Secondary | ICD-10-CM

## 2014-03-05 DIAGNOSIS — E039 Hypothyroidism, unspecified: Secondary | ICD-10-CM | POA: Insufficient documentation

## 2014-03-05 MED ORDER — LEVOTHYROXINE SODIUM 100 MCG PO TABS: 100.0000 ug | ORAL_TABLET | Freq: Every day | ORAL | Status: DC

## 2014-03-05 NOTE — Assessment & Plan Note (Signed)
BP Readings from Last 3 Encounters:  03/05/14 154/58  12/09/13 140/80  02/27/13 144/86    Lab Results  Component Value Date   NA 139 09/09/2012   K 3.9 09/09/2012   CREATININE 1.11* 09/09/2012    Assessment: Blood pressure control:   Progress toward BP goal:    Comments: the patient reports feeling anxious for today's visit and is against starting medication today  Plan: Medications:  Will need to consider starting medication the patient had previously been on HCTZ 25 mg Educational resources provided: brochure Self management tools provided:   Other plans: will followup in one month for blood pressure recheck

## 2014-03-05 NOTE — Patient Instructions (Signed)
General Instructions:   Please bring your medicines with you each time you come to clinic.  Medicines may include prescription medications, over-the-counter medications, herbal remedies, eye drops, vitamins, or other pills.   Progress Toward Treatment Goals:  Treatment Goal 09/09/2012  Blood pressure at goal    Self Care Goals & Plans:  Self Care Goal 03/05/2014  Manage my medications take my medicines as prescribed; bring my medications to every visit; refill my medications on time  Monitor my health -  Eat healthy foods drink diet soda or water instead of juice or soda; eat more vegetables; eat foods that are low in salt; eat baked foods instead of fried foods; eat fruit for snacks and desserts  Be physically active -    No flowsheet data found.   Care Management & Community Referrals:  No flowsheet data found.

## 2014-03-05 NOTE — Assessment & Plan Note (Addendum)
This is likely multifactorial. The patient was prediabetes and diabetes runs in her family. There is also component of depression and lack of poor sleep quality. The patient also has uncontrolled hypothyroidism. The patient has stable weight.  Wt Readings from Last 3 Encounters:  03/05/14 257 lb 4.8 oz (116.711 kg)  12/09/13 258 lb 1.6 oz (117.073 kg)  02/27/13 242 lb 6.4 oz (109.952 kg)    -TSH test -Education on good sleep hygiene. -Hemoglobin A1c - Continued depression screening the patient is refusing medication this time and CBT therapy although both of these were offered today at this visit -pt refused flu vaccine today

## 2014-03-05 NOTE — Assessment & Plan Note (Signed)
Continues to have medication compliance issues. -Recheck TSH -Continue Synthroid 100 mcg daily until this TSH reading is back to make any titrations

## 2014-03-05 NOTE — Progress Notes (Signed)
Patient ID: Joanna Gilbert, female   DOB: May 02, 1951, 63 y.o.   MRN: [id number]   Subjective:   Patient ID: Joanna Gilbert female   DOB: 18-Oct-1950 63 y.o.   MRN: [id number]  HPI: Joanna Gilbert is a 63 y.o. woman with a past medical history as listed below here for followup on her thyroid disease.  The patient states that ever since her last visit she has been having some intermittent problems taking her prescription and states that she runs out of her meds anywhere between one week or 2 weeks every month. Therefore she has not been consistently taking her thyroid medication since that time. She continues to have some cold intolerance, extreme fatigue, and these new sensations of craving meat and "hunger spells." She has also been having a pain between her eyes that she thinks coincides with these hunger cravings and changes in her vision for about the past year. She does not have any central vision loss and maybe only slight peripheral vision loss. 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  She also talks about recently losing her husband and still having some problems sleeping. She had called the nurse hotline several times to ask to use her husband's Atarax after he passed away. She reports that she did not take that medication and returned all of his medications to the pharmacy. She states that sometimes she feels very anxious about taking care of her daughter's and grandchildren after her husband's passing and has trouble sleeping. She sometimes can have some sweats or "jitters" with the fatigue if she hasn't eaten anything. She is still able to take care of them and take care of herself. She has not noticed any weight loss, inability to perform her ADLs, or neglect of herself.  She has not taken any of her blood pressure medications today.   Past Medical History  Diagnosis Date  . Hypertension   . Hypothyroidism   . Fibroid uterus  05/06/2006    Nov 2011: total abdominal hysterectomy with b/l salpingoopherectomy by Dr. Mora Bellman for symptomatic fibroids.  Qualifier: Diagnosis of  By: [name] MD, [name]    . Osteoarthritis 02/21/2011    MR b/l Hips (2009): Mild to moderate bilateral hip osteoarthritis, more notable on the right, with likely associated small area of anterior acetabular labral tearing.  MR R knee (2009): Dominant finding is marked interval progression in lateral compartment degenerative disease with new tearing in the posterior horn of the lateral meniscus. Persistent perimeniscal cyst formation related to lateral meniscal tearing as describe above MR Lumbar spine (2009) : 1. Dominant finding is advanced facet degenerative change L4-5 with small facet joint effusions and bony edema about the joints. This could be a source of back pain.  2. Negative for notable central canal and foraminal stenosis in patient with a somewhat congenitally narrowed central canal.    . Depression 02/21/2011  . Hyperlipidemia   . Prediabetes   . Plantar fasciitis, bilateral   . History of colonic polyps   . Diverticulosis    Current Outpatient Prescriptions  Medication Sig Dispense Refill  . ibuprofen (ADVIL,MOTRIN) 400 MG tablet Take 1-2 tablets as need for pain at bedtime  90 tablet  3  . levothyroxine (SYNTHROID, LEVOTHROID) 100 MCG tablet Take 1 tablet (100 mcg total) by mouth daily before breakfast.  30 tablet  1  . loratadine (CLARITIN) 10 MG tablet Take 10 mg by mouth daily as  needed for allergies.       No current facility-administered medications for this visit.   Family History  Problem Relation Age of Onset  . Alcohol abuse Mother   . Heart disease Mother 30    required CABG  . Hypothyroidism Father   . Arthritis Sister   . Hypertension Sister    History   Social History  . Marital Status: Married    Spouse Name: N/A    Number of Children: 2  . Years of Education: college   Occupational History  .  unemployed     used to work as a Teacher, early years/pre   Social History Main Topics  . Smoking status: Never Smoker   . Smokeless tobacco: Never Used  . Alcohol Use: Yes  . Drug Use: No  . Sexual Activity: No   Other Topics Concern  . None   Social History Narrative   From [place], has lived in [place] since 1993 and lives with her husband. Can read and write fluently in [place].   Review of Systems: Pertinent items are noted in HPI. Objective:  Physical Exam: Filed Vitals:   03/05/14 1352  BP: 154/58  Pulse: 68  Temp: 98.3 F (36.8 C)  TempSrc: Oral  Height: 5\' 3"  (1.6 m)  Weight: 257 lb 4.8 oz (116.711 kg)  SpO2: 99%   General: sitting in chair, NAD  HEENT: PERRL, EOMI, no scleral icterus, gross normal peripheral vision and normal visual field testing, thyroid diffusely enlarged no lumps or isolated cysts noted, no hoarseness to speech Cardiac: distant HS, RRR, no rubs, murmurs or gallops Pulm: clear to auscultation bilaterally, no crackles, wheezes or rhonchi, moving normal volumes of air Abd: soft, nontender, nondistended, BS present Ext: warm and well perfused, no pedal edema Neuro: alert and oriented X3, cranial nerves II-XII grossly intact  Assessment & Plan:  Please see problem oriented charting  Pt discussed with Dr. Lynnae January

## 2014-03-06 LAB — T4, FREE: Free T4: 1.01 ng/dL (ref 0.80–1.80)

## 2014-03-06 LAB — TSH: TSH: 2.345 u[IU]/mL (ref 0.350–4.500)

## 2014-03-08 ENCOUNTER — Ambulatory Visit: Payer: Self-pay

## 2014-03-08 NOTE — Progress Notes (Signed)
Internal Medicine Clinic Attending  Case discussed with Dr. Sadek soon after the resident saw the patient.  We reviewed the resident's history and exam and pertinent patient test results.  I agree with the assessment, diagnosis, and plan of care documented in the resident's note. 

## 2014-03-09 ENCOUNTER — Ambulatory Visit: Payer: Self-pay

## 2014-03-19 ENCOUNTER — Encounter: Payer: Self-pay | Admitting: Internal Medicine

## 2014-03-19 ENCOUNTER — Ambulatory Visit (INDEPENDENT_AMBULATORY_CARE_PROVIDER_SITE_OTHER): Payer: Self-pay | Admitting: Internal Medicine

## 2014-03-19 VITALS — BP 153/71 | HR 89 | Temp 98.1°F | Ht 63.0 in | Wt 258.0 lb

## 2014-03-19 DIAGNOSIS — E039 Hypothyroidism, unspecified: Secondary | ICD-10-CM

## 2014-03-19 DIAGNOSIS — F418 Other specified anxiety disorders: Secondary | ICD-10-CM

## 2014-03-19 DIAGNOSIS — I10 Essential (primary) hypertension: Secondary | ICD-10-CM

## 2014-03-19 DIAGNOSIS — H9201 Otalgia, right ear: Secondary | ICD-10-CM

## 2014-03-19 MED ORDER — HYDROCHLOROTHIAZIDE 25 MG PO TABS
25.0000 mg | ORAL_TABLET | Freq: Every day | ORAL | Status: DC
Start: 2014-03-19 — End: 2015-08-03

## 2014-03-19 NOTE — Assessment & Plan Note (Signed)
Pt will be seeking CBT therapy at [facility] in the setting of her recent husband's death. Pt has no SI or HI today and is still able to perform her ADLs.

## 2014-03-19 NOTE — Progress Notes (Signed)
Subjective:   Patient ID: Joanna Gilbert female   DOB: Dec 01, 1950 63 y.o.   MRN: [id number]  HPI: [name] is a 63 y.o. woman pmh as listed below presents for HTN, and hypothyroidism follow up.   She complains of still having a lot of anxiety in her life and that must be the etiology to her hypertension. She denies any chest pain, shortness of breath, dizziness, or blurry vision. Tremor is being on some antihypertensive medications in the past but had been able to lower it through diet and exercise.  Terms of her hypothyroidism her TSH and free T4 within normal limits and therefore no changes were made to her medications and these results were shared with the patient today.  Terms of her anxiety she has tried coming into contact with hospice given her husband's recent death and feels that she is open to CBT therapy at this time o'clock up again. She has no suicidal ideation or homicidal ideation at this time she is able to perform all her ADLs.  She complains of some ongoing right ear pain combined with some hearing loss as reported by her family (They have noticed and complained that she has been increasing the volume on her television that has become worse recently). She does at times have some sensations of vertigo she says that these feelings come and go and nothing really seems to deviate the pain and nothing really seems to make it worse. It is never associated with nausea/vomiting or HA.    Past Medical History  Diagnosis Date  . Hypertension   . Hypothyroidism   . Fibroid uterus 05/06/2006    Nov 2011: total abdominal hysterectomy with b/l salpingoopherectomy by Dr. Mora Bellman for symptomatic fibroids.  Qualifier: Diagnosis of  By: [name] MD, [name]    . Osteoarthritis 02/21/2011    MR b/l Hips (2009): Mild to moderate bilateral hip osteoarthritis, more notable on the right, with likely associated small area of anterior acetabular labral tearing.  MR R knee (2009):  Dominant finding is marked interval progression in lateral compartment degenerative disease with new tearing in the posterior horn of the lateral meniscus. Persistent perimeniscal cyst formation related to lateral meniscal tearing as describe above MR Lumbar spine (2009) : 1. Dominant finding is advanced facet degenerative change L4-5 with small facet joint effusions and bony edema about the joints. This could be a source of back pain.  2. Negative for notable central canal and foraminal stenosis in patient with a somewhat congenitally narrowed central canal.    . Depression 02/21/2011  . Hyperlipidemia   . Prediabetes   . Plantar fasciitis, bilateral   . History of colonic polyps   . Diverticulosis    Current Outpatient Prescriptions  Medication Sig Dispense Refill  . hydrochlorothiazide (HYDRODIURIL) 25 MG tablet Take 1 tablet (25 mg total) by mouth daily.  30 tablet  12  . ibuprofen (ADVIL,MOTRIN) 400 MG tablet Take 1-2 tablets as need for pain at bedtime  90 tablet  3  . levothyroxine (SYNTHROID, LEVOTHROID) 100 MCG tablet Take 1 tablet (100 mcg total) by mouth daily before breakfast.  30 tablet  1  . loratadine (CLARITIN) 10 MG tablet Take 10 mg by mouth daily as needed for allergies.       No current facility-administered medications for this visit.   Family History  Problem Relation Age of Onset  . Alcohol abuse Mother   . Heart disease Mother 31    required CABG  .  Hypothyroidism Father   . Arthritis Sister   . Hypertension Sister    History   Social History  . Marital Status: Married    Spouse Name: N/A    Number of Children: 2  . Years of Education: college   Occupational History  . unemployed     used to work as a Teacher, early years/pre   Social History Main Topics  . Smoking status: Never Smoker   . Smokeless tobacco: Never Used  . Alcohol Use: Yes  . Drug Use: No  . Sexual Activity: No   Other Topics Concern  . None   Social History Narrative   From [place], has  lived in [place] since 1993 and lives with her husband. Can read and write fluently in [place].   Review of Systems:Pertinent items are noted in HPI.  Objective:  Physical Exam: Filed Vitals:   03/19/14 1441  BP: 153/71  Pulse: 89  Temp: 98.1 F (36.7 C)  TempSrc: Oral  Height: 5\' 3"  (1.6 m)  Weight: 258 lb (117.028 kg)  SpO2: 98%   General: sitting in chair, NAD HEENT: PERRL, EOMI, TM pearly gray, no cerumen, no scleral icterus Cardiac: RRR, no rubs, murmurs or gallops Pulm: clear to auscultation bilaterally, moving normal volumes of air Abd: soft, nontender, nondistended, BS present Ext: warm and well perfused, no pedal edema Neuro: alert and oriented X3, cranial nerves II-XII grossly intact  Assessment & Plan:  Please see problem oriented charting  Pt discussed with Dr. Lynnae January

## 2014-03-19 NOTE — Assessment & Plan Note (Signed)
This seems to be an ongoing problem for the patient. No clear evidence on repeated exam of infection, anatomical etiology, or foreign body. Now pt experiencing hearing loss.  -ENT referral for audiology and evaluation

## 2014-03-19 NOTE — Assessment & Plan Note (Signed)
BP Readings from Last 3 Encounters:  03/19/14 153/71  03/05/14 154/58  12/09/13 140/80    Lab Results  Component Value Date   NA 139 09/09/2012   K 3.9 09/09/2012   CREATININE 1.11* 09/09/2012    Assessment: Blood pressure control:   Progress toward BP goal:    Comments: continues to have elevations  Plan: Medications:  start HCTZ 25mg  qd Educational resources provided:   Self management tools provided:   Other plans: recheck BP in [redacted] wks along with BMET, lifestyle modifications along with seeking CBT were encouraged and discussed.

## 2014-03-19 NOTE — Assessment & Plan Note (Signed)
Lab Results  Component Value Date   TSH 2.345 03/05/2014   Was within normal limits will continue synthroid at current dose.

## 2014-03-19 NOTE — Patient Instructions (Signed)
General Instructions:   Please bring your medicines with you each time you come to clinic.  Medicines may include prescription medications, over-the-counter medications, herbal remedies, eye drops, vitamins, or other pills.   Progress Toward Treatment Goals:  Treatment Goal 09/09/2012  Blood pressure at goal    Self Care Goals & Plans:  Self Care Goal 03/19/2014  Manage my medications take my medicines as prescribed; bring my medications to every visit; refill my medications on time  Monitor my health -  Eat healthy foods eat more vegetables; eat foods that are low in salt; eat baked foods instead of fried foods  Be physically active -    No flowsheet data found.   Care Management & Community Referrals:  No flowsheet data found.

## 2014-03-26 NOTE — Progress Notes (Signed)
Internal Medicine Clinic Attending  Case discussed with Dr. Sadek soon after the resident saw the patient.  We reviewed the resident's history and exam and pertinent patient test results.  I agree with the assessment, diagnosis, and plan of care documented in the resident's note. 

## 2014-04-30 ENCOUNTER — Encounter: Payer: Self-pay | Admitting: Internal Medicine

## 2014-04-30 ENCOUNTER — Ambulatory Visit (INDEPENDENT_AMBULATORY_CARE_PROVIDER_SITE_OTHER): Payer: Self-pay | Admitting: Internal Medicine

## 2014-04-30 ENCOUNTER — Other Ambulatory Visit: Payer: Self-pay

## 2014-04-30 VITALS — BP 155/64 | HR 85 | Temp 98.8°F | Ht 63.0 in | Wt 266.6 lb

## 2014-04-30 DIAGNOSIS — I10 Essential (primary) hypertension: Secondary | ICD-10-CM

## 2014-04-30 DIAGNOSIS — R0609 Other forms of dyspnea: Secondary | ICD-10-CM

## 2014-04-30 LAB — BASIC METABOLIC PANEL
BUN: 13 mg/dL (ref 6–23)
CALCIUM: 10.3 mg/dL (ref 8.4–10.5)
CO2: 28 meq/L (ref 19–32)
CREATININE: 0.71 mg/dL (ref 0.50–1.10)
Chloride: 99 mEq/L (ref 96–112)
GLUCOSE: 144 mg/dL — AB (ref 70–99)
Potassium: 4.1 mEq/L (ref 3.5–5.3)
SODIUM: 141 meq/L (ref 135–145)

## 2014-04-30 NOTE — Patient Instructions (Signed)
General Instructions:   Please bring your medicines with you each time you come to clinic.  Medicines may include prescription medications, over-the-counter medications, herbal remedies, eye drops, vitamins, or other pills.   Progress Toward Treatment Goals:  Treatment Goal 09/09/2012  Blood pressure at goal    Self Care Goals & Plans:  Self Care Goal 04/30/2014  Manage my medications take my medicines as prescribed; bring my medications to every visit; refill my medications on time  Monitor my health -  Eat healthy foods drink diet soda or water instead of juice or soda; eat more vegetables; eat foods that are low in salt; eat baked foods instead of fried foods; eat fruit for snacks and desserts  Be physically active -    No flowsheet data found.   Care Management & Community Referrals:  No flowsheet data found.

## 2014-04-30 NOTE — Assessment & Plan Note (Signed)
BP Readings from Last 3 Encounters:  04/30/14 155/64  03/19/14 153/71  03/05/14 154/58    Lab Results  Component Value Date   NA 139 09/09/2012   K 3.9 09/09/2012   CREATININE 1.11* 09/09/2012    Assessment: Blood pressure control:  continues to be slightly elevated Progress toward BP goal:    Comments: [name] feels strongly that she gets more nervous when she reaches the clinic and that might be why some of her blood pressure readings are higher. She has not taken any of her blood pressures at home.  Plan: Medications:  Continue hydrochlorothiazide 25 mg daily Educational resources provided: brochure (denies) Self management tools provided: home blood pressure logbook Other plans: The patient was given at home blood pressure logbook, the patient is reluctant to start a new agent today she is maximized on HCTZ and feels that she would like to continue some lifestyle modification in conjunction with this medication. Therefore education materials was printed and given to the patient. A repeat beam at was done today. Follow-up in one month for reassessment and need to add another agent. EKG was performed in clinic that showed normal sinus rhythm with nonspecific ST changes there are none to compare but no Q waves and no ST elevations.

## 2014-04-30 NOTE — Progress Notes (Signed)
Subjective:   Patient ID: Joanna Gilbert female   DOB: 11-Dec-1950 63 y.o.   MRN: [id number]  HPI: [name] is a 63 y.o. woman with a past medical history as listed below who presents for hypertension follow-up.  Current Outpatient Prescriptions  Medication Sig Dispense Refill  . hydrochlorothiazide (HYDRODIURIL) 25 MG tablet Take 1 tablet (25 mg total) by mouth daily. 30 tablet 12  . ibuprofen (ADVIL,MOTRIN) 400 MG tablet Take 1-2 tablets as need for pain at bedtime 90 tablet 3  . levothyroxine (SYNTHROID, LEVOTHROID) 100 MCG tablet Take 1 tablet (100 mcg total) by mouth daily before breakfast. 30 tablet 1  . loratadine (CLARITIN) 10 MG tablet Take 10 mg by mouth daily as needed for allergies.     No current facility-administered medications for this visit.    Hypertension ROS: taking medications as instructed, no medication side effects noted, no TIA's, no chest pain on exertion, notes new dyspnea on exertion that has occured over last month and no swelling of ankles.    Past Medical History  Diagnosis Date  . Hypertension   . Hypothyroidism   . Fibroid uterus 05/06/2006    Nov 2011: total abdominal hysterectomy with b/l salpingoopherectomy by Dr. Mora Bellman for symptomatic fibroids.  Qualifier: Diagnosis of  By: [name] MD, [name]    . Osteoarthritis 02/21/2011    MR b/l Hips (2009): Mild to moderate bilateral hip osteoarthritis, more notable on the right, with likely associated small area of anterior acetabular labral tearing.  MR R knee (2009): Dominant finding is marked interval progression in lateral compartment degenerative disease with new tearing in the posterior horn of the lateral meniscus. Persistent perimeniscal cyst formation related to lateral meniscal tearing as describe above MR Lumbar spine (2009) : 1. Dominant finding is advanced facet degenerative change L4-5 with small facet joint effusions and bony edema about the joints. This could be a source of back  pain.  2. Negative for notable central canal and foraminal stenosis in patient with a somewhat congenitally narrowed central canal.    . Depression 02/21/2011  . Hyperlipidemia   . Prediabetes   . Plantar fasciitis, bilateral   . History of colonic polyps   . Diverticulosis    Current Outpatient Prescriptions  Medication Sig Dispense Refill  . hydrochlorothiazide (HYDRODIURIL) 25 MG tablet Take 1 tablet (25 mg total) by mouth daily. 30 tablet 12  . ibuprofen (ADVIL,MOTRIN) 400 MG tablet Take 1-2 tablets as need for pain at bedtime 90 tablet 3  . levothyroxine (SYNTHROID, LEVOTHROID) 100 MCG tablet Take 1 tablet (100 mcg total) by mouth daily before breakfast. 30 tablet 1  . loratadine (CLARITIN) 10 MG tablet Take 10 mg by mouth daily as needed for allergies.     No current facility-administered medications for this visit.   Family History  Problem Relation Age of Onset  . Alcohol abuse Mother   . Heart disease Mother 70    required CABG  . Hypothyroidism Father   . Arthritis Sister   . Hypertension Sister    History   Social History  . Marital Status: Married    Spouse Name: N/A    Number of Children: 2  . Years of Education: college   Occupational History  . unemployed     used to work as a Teacher, early years/pre   Social History Main Topics  . Smoking status: Never Smoker   . Smokeless tobacco: Never Used  . Alcohol Use: 0.0 oz/week  0 Not specified per week  . Drug Use: No  . Sexual Activity: No   Other Topics Concern  . None   Social History Narrative   From [place], has lived in [place] since 1993 and lives with her husband. Can read and write fluently in [place].   Review of Systems: Pertinent items are noted in HPI. Objective:  Physical Exam: Filed Vitals:   04/30/14 1620 04/30/14 1621  BP: 155/64   Pulse: 85   Temp:  98.8 F (37.1 C)  TempSrc:  Oral  Height: 5\' 3"  (1.6 m)   Weight: 266 lb 9.6 oz (120.929 kg)   SpO2: 99%    General: sitting in  chair, NAD HEENT: PERRL, EOMI, no scleral icterus Cardiac: RRR, no rubs, murmurs or gallops Pulm: clear to auscultation bilaterally, moving normal volumes of air Abd: soft, nontender, nondistended, BS present Ext: warm and well perfused, no pedal edema Neuro: alert and oriented X3, cranial nerves II-XII grossly intact  Assessment & Plan:  Please see problem oriented charting  Pt discussed with Dr. Dareen Piano

## 2014-05-03 NOTE — Progress Notes (Signed)
INTERNAL MEDICINE TEACHING ATTENDING ADDENDUM - Kathi Dohn, MD: I reviewed and discussed at the time of visit with the resident Dr. Sadek, the patient's medical history, physical examination, diagnosis and results of pertinent tests and treatment and I agree with the patient's care as documented.  

## 2014-06-11 ENCOUNTER — Ambulatory Visit: Payer: No Typology Code available for payment source | Attending: Internal Medicine

## 2014-06-11 DIAGNOSIS — M79672 Pain in left foot: Secondary | ICD-10-CM

## 2014-06-11 DIAGNOSIS — M79671 Pain in right foot: Secondary | ICD-10-CM

## 2014-06-11 DIAGNOSIS — M79604 Pain in right leg: Secondary | ICD-10-CM

## 2014-06-11 DIAGNOSIS — R262 Difficulty in walking, not elsewhere classified: Secondary | ICD-10-CM | POA: Insufficient documentation

## 2014-06-11 DIAGNOSIS — R279 Unspecified lack of coordination: Secondary | ICD-10-CM | POA: Insufficient documentation

## 2014-06-11 DIAGNOSIS — M79605 Pain in left leg: Secondary | ICD-10-CM | POA: Insufficient documentation

## 2014-06-11 NOTE — Patient Instructions (Signed)
Hip Flexor Stretch   Lying on back near edge of bed, bend one leg, foot flat. Hang other leg over edge, relaxed,bend knee until a gentle stretch is felt. Hold 30 seconds Repeat 3 times. Do 2 sessions per day. Advanced Exercise: Bend knee back keeping thigh in contact with bed.  http://gt2.exer.us/347   Copyright  VHI. All rights reserved.    Copyright  VHI. All rights reserved.  Gluteal Stretch   Lie supine, one foot flat on bed, other, other leg crossed with ankle over knee. Gently push knee away from your body until a stretch is felt. Perform 3x for 30 seconds.  HIP: Hamstrings - Short Sitting   Scoot to edge of chair. Rest leg on floor. Keep knee straight. Lift chest and lean forward.  Hold 30 seconds. Perform 3x morning and night.Copyright  VHI. All rights reserved.   Copyright  VHI. All rights reserved.

## 2014-06-11 NOTE — Therapy (Signed)
[facility] [address] [place], [place], [address] Phone: [phone]   Fax:  [phone]  Physical Therapy Evaluation  Patient Details  Name: Joanna Gilbert MRN: [id number] Date of Birth: 02-23-1951 Referring Provider:  Clinton Gallant, MD  Encounter Date: 06/11/2014      PT End of Session - 06/11/14 1641    Visit Number 1   Number of Visits 17   Date for PT Re-Evaluation 08/10/14   Authorization Type GCCN expires 09/08/14   PT Start Time 1404   PT Stop Time 1450   PT Time Calculation (min) 46 min      Past Medical History  Diagnosis Date  . Hypertension   . Hypothyroidism   . Fibroid uterus 05/06/2006    Nov 2011: total abdominal hysterectomy with b/l salpingoopherectomy by Dr. Mora Bellman for symptomatic fibroids.  Qualifier: Diagnosis of  By: [name] MD, [name]    . Osteoarthritis 02/21/2011    MR b/l Hips (2009): Mild to moderate bilateral hip osteoarthritis, more notable on the right, with likely associated small area of anterior acetabular labral tearing.  MR R knee (2009): Dominant finding is marked interval progression in lateral compartment degenerative disease with new tearing in the posterior horn of the lateral meniscus. Persistent perimeniscal cyst formation related to lateral meniscal tearing as describe above MR Lumbar spine (2009) : 1. Dominant finding is advanced facet degenerative change L4-5 with small facet joint effusions and bony edema about the joints. This could be a source of back pain.  2. Negative for notable central canal and foraminal stenosis in patient with a somewhat congenitally narrowed central canal.    . Depression 02/21/2011  . Hyperlipidemia   . Prediabetes   . Plantar fasciitis, bilateral   . History of colonic polyps   . Diverticulosis     Past Surgical History  Procedure Laterality Date  . Total abdominal hysterectomy w/ bilateral salpingoophorectomy  03/2010    because of  symptomatic fibroids, Dr. Mora Bellman  . Knee surgery Right 2007, 2009    Had cyst removed  . Gastric restriction surgery  1980  . Cholecystectomy      There were no vitals taken for this visit.  Visit Diagnosis:  Bilateral leg and foot pain  Lack of coordination  Difficulty walking down stairs      Subjective Assessment - 06/11/14 1413    Symptoms Pt reports that she has had chronic back pain, hip pain, and plantar fasciitis. Ever since her knee surgeries in 2007 and 2009 she has had difficulty with knee instability. Also reports balance impairment  with sensation that she is drifting toward the right.   Pertinent History Arthritis in both hips, R hip small labral tear, R knee lateral meniscus tear, L4/5 degenerative changes   Patient Stated Goals decreasing pain and improving balance   Currently in Pain? No/denies          [facility] PT Assessment - 06/11/14 0001    Assessment   Medical Diagnosis plantar fasciitis   Onset Date --  2006   Prior Therapy --  none   Precautions   Precautions Fall  monitor blood pressure as needed   Balance Screen   Has the patient fallen in the past 6 months No   Has the patient had a decrease in activity level because of a fear of falling?  Yes   Is the patient reluctant to leave their home because of a fear of falling?  Yes  especially if it is [place] residence   Living Arrangements Other (Comment)  adult grandson   Available Help at Discharge Family   Type of [place] to enter   Entrance Stairs-Number of Steps 2   [place] One level   Prior Function   Level of Independence Independent with basic ADLs;Independent with homemaking with ambulation;Independent with transfers;Independent with gait   Observation/Other Assessments   Focus on Therapeutic Outcomes (FOTO)  Functional status= 54   Other Surveys  Select   Activities of  Balance Confidence Scale (ABC Scale)  24%   Strength   Overall Strength Deficits   Overall Strength Comments bilateral quads/hamstrings/dorsiflexors/plantarflex all WNL  R hip flexors 3/5   Flexibility   Soft Tissue Assessment /Muscle Lenght yes  tight gluteus maximus   Quadriceps limited to 90 degrees with stretch end feel   ITB postive for tension     Pt reports difficulty walking down the stairs. Reports uncontrolled descend. Unable to assess due to limited time. Will assess next visit.  Therex: Taught and provided HEP                     PT Education - 06/11/14 1641    Education provided Yes   Education Details HEP for stretching   Person(s) Educated Patient   Methods Explanation;Demonstration;Handout   Comprehension Verbalized understanding;Returned demonstration;Need further instruction          PT Short Term Goals - 06/11/14 1645    PT SHORT TERM GOAL #1   Title Demonstrate correct performance of HEP Target 07/09/14   PT SHORT TERM GOAL #2   Title Complete Berg Balance test and write appropriate STG and LTG:_________________   PT SHORT TERM GOAL #3   Title Complete Pain disability index (on paper as it was not included on FOTO)  and write appropriate STG and LTG _________________   PT SHORT TERM GOAL #4   Title Report increased ability to rest at night when in sidelying due to decreased hip (likely IT band) pain. Target 07/09/14           PT Long Term Goals - 06/11/14 1648    PT LONG TERM GOAL #1   Title Verbalize understading of fall prevention education Target 08/10/14   PT LONG TERM GOAL #2   Title Berg goal:_________________Target 08/10/14   PT LONG TERM GOAL #3   Title Pain disability index goal___________________Target 08/10/14   PT LONG TERM GOAL #4   Title Report increased ease with negotiating stairs for improved home access. Target 08/10/14   PT LONG TERM GOAL #5   Title Increase FOTO ABC goal to 35% for increased balance confidence.  Target 08/10/14               Plan - 06/11/14 1643    Clinical Impression Statement Pt has back pain, bilateral hip and knee pain, knee instability, impaired balance, and will benefit from PT services for stretching, strengthening and balance training as well as recommendations for foot ear to improve these impairments.   Rehab Potential Good   PT Frequency 2x / week   PT Duration 8 weeks   PT Treatment/Interventions ADLs/Self Care Home Management;Moist Heat;Manual techniques;Balance training;Gait training;Therapeutic exercise;Therapeutic activities;Neuromuscular re-education;Stair training;Patient/family education  possible orthotics   PT Next Visit Plan Assess Merrilee Jansky, have pt complete Pain Disability Questionaire and write goals. Provide sidelying IT band stretch  and balance HEP         Problem List Patient Active Problem List   Diagnosis Date Noted  . Otalgia of right ear 03/19/2014  . Other fatigue 03/05/2014  . Prediabetes 09/09/2012  . Hyperlipidemia   . Depression with anxiety 02/21/2011  . Osteoarthritis 02/21/2011  . Hypothyroidism 05/06/2006  . Essential hypertension 05/06/2006  . KNEE PAIN 05/06/2006    Delrae Sawyers D 06/11/2014, 4:55 PM  [facility] [address] [place], [place], [address] Phone: [phone]   Fax:  [phone]

## 2014-06-16 ENCOUNTER — Encounter: Payer: Self-pay | Admitting: Physical Therapy

## 2014-06-16 ENCOUNTER — Ambulatory Visit: Payer: No Typology Code available for payment source | Admitting: Physical Therapy

## 2014-06-16 DIAGNOSIS — M79672 Pain in left foot: Secondary | ICD-10-CM

## 2014-06-16 DIAGNOSIS — M79604 Pain in right leg: Secondary | ICD-10-CM

## 2014-06-16 DIAGNOSIS — R262 Difficulty in walking, not elsewhere classified: Secondary | ICD-10-CM

## 2014-06-16 DIAGNOSIS — M79605 Pain in left leg: Secondary | ICD-10-CM

## 2014-06-16 DIAGNOSIS — M79671 Pain in right foot: Secondary | ICD-10-CM

## 2014-06-16 DIAGNOSIS — R279 Unspecified lack of coordination: Secondary | ICD-10-CM

## 2014-06-16 NOTE — Patient Instructions (Signed)
Single Leg - Eyes Open   Holding support, lift right leg while maintaining balance over left leg. Progress to removing hands from support surface for longer periods of time. Hold__10__ seconds. Repeat with standing on left leg and lifting right leg. Repeat __3_ times each leg. Do _1-2___ sessions per day.  Copyright  VHI. All rights reserved.  "I love a Art therapist for balance: high knee marching forward and then backwards. 3 laps each way. 1-2 times a day http://gt2.exer.us/345   Copyright  VHI. All rights reserved.  Walking on Heels   At counter for support as needed: heel walking forward and then backwards. 3 laps each way. 1-2 times a day.  Copyright  VHI. All rights reserved.  Walking on Toes   Using counter as needed for support: walk on toes forward and then backwards. 3 laps each way. 1-2 times a day.  Copyright  VHI. All rights reserved.

## 2014-06-16 NOTE — Therapy (Signed)
[facility] [address] [place], [place], [address] Phone: [phone]   Fax:  [phone]  Physical Therapy Treatment  Patient Details  Name: Joanna Gilbert MRN: [id number] Date of Birth: 11-Mar-1951 Referring Provider:  Clinton Gallant, MD  Encounter Date: 06/16/2014      PT End of Session - 06/16/14 1324    Visit Number 2   Number of Visits 17   Date for PT Re-Evaluation 08/10/14   Authorization Type GCCN expires 09/08/14   PT Start Time 1318   PT Stop Time 1402   PT Time Calculation (min) 44 min   Equipment Utilized During Treatment Gait belt   Activity Tolerance Patient tolerated treatment well   Behavior During Therapy [facility] for tasks assessed/performed      Past Medical History  Diagnosis Date  . Hypertension   . Hypothyroidism   . Fibroid uterus 05/06/2006    Nov 2011: total abdominal hysterectomy with b/l salpingoopherectomy by Dr. Mora Bellman for symptomatic fibroids.  Qualifier: Diagnosis of  By: [name] MD, [name]    . Osteoarthritis 02/21/2011    MR b/l Hips (2009): Mild to moderate bilateral hip osteoarthritis, more notable on the right, with likely associated small area of anterior acetabular labral tearing.  MR R knee (2009): Dominant finding is marked interval progression in lateral compartment degenerative disease with new tearing in the posterior horn of the lateral meniscus. Persistent perimeniscal cyst formation related to lateral meniscal tearing as describe above MR Lumbar spine (2009) : 1. Dominant finding is advanced facet degenerative change L4-5 with small facet joint effusions and bony edema about the joints. This could be a source of back pain.  2. Negative for notable central canal and foraminal stenosis in patient with a somewhat congenitally narrowed central canal.    . Depression 02/21/2011  . Hyperlipidemia   . Prediabetes   . Plantar fasciitis, bilateral   . History of colonic polyps   .  Diverticulosis     Past Surgical History  Procedure Laterality Date  . Total abdominal hysterectomy w/ bilateral salpingoophorectomy  03/2010    because of symptomatic fibroids, Dr. Mora Bellman  . Knee surgery Right 2007, 2009    Had cyst removed  . Gastric restriction surgery  1980  . Cholecystectomy      There were no vitals taken for this visit.  Visit Diagnosis:  Difficulty walking down stairs  Lack of coordination  Bilateral leg and foot pain      Subjective Assessment - 06/16/14 1322    Symptoms No new complaints. No new falls to report. Not able to do the supine piriformis stretch as it causes her right knee to "lock up".   Currently in Pain? Yes   Pain Score 4    Pain Location Foot  plantar fascia of both feet.   Pain Orientation Right;Left  right > left   Pain Descriptors / Indicators Aching   Pain Type Chronic pain   Pain Frequency Intermittent   Aggravating Factors  walking on hard surfaces   Pain Relieving Factors shoes with inserts helped in the past           [facility] Adult PT Treatment/Exercise - 06/16/14 1325    Ambulation/Gait   Stairs Yes   Stairs Assistance 4: Min guard;5: Supervision   Stair Management Technique Step to pattern;Alternating pattern;One rail Left   Number of Stairs 4  x3 reps   Berg Balance Test   Sit to Stand Able to stand  without using hands and stabilize independently   Standing Unsupported Able to stand safely 2 minutes   Sitting with Back Unsupported but Feet Supported on Floor or Stool Able to sit safely and securely 2 minutes   Stand to Sit Sits safely with minimal use of hands   Transfers Able to transfer safely, minor use of hands   Standing Unsupported with Eyes Closed Able to stand 10 seconds with supervision   Standing Ubsupported with Feet Together Able to place feet together independently and stand for 1 minute with supervision   From Standing, Reach Forward with Outstretched Arm Can reach forward >12 cm safely  (5")  8 inches   From Standing Position, Pick up Object from Floor Able to pick up shoe safely and easily   From Standing Position, Turn to Look Behind Over each Shoulder Looks behind from both sides and weight shifts well   Turn 360 Degrees Able to turn 360 degrees safely in 4 seconds or less  right 1.97, left 2.19   Standing Unsupported, Alternately Place Feet on Step/Stool Able to stand independently and safely and complete 8 steps in 20 seconds  9.94 sec   Standing Unsupported, One Foot in Front Able to plae foot ahead of the other independently and hold 30 seconds   Standing on One Leg Able to lift leg independently and hold equal to or more than 3 seconds  left stance only; right lifts unable to hold.   Total Score 50      Treatment Continued: Exercise - step downs at bottom step x 10 each leg with 1 UE support, step ups x 10 each leg with 1 UE support.  Neuro Re-ed: - at counter top: single leg stance 10 sec x 3 reps each leg; high knee marching, toe walk and heel walk forward/backwards x 3 laps each with min guard assist and cues on posture and ex form. Issued as HEP.        PT Education - 06/16/14 1359    Education provided Yes   Education Details HEP for balance   Person(s) Educated Patient   Methods Explanation;Demonstration;Handout   Comprehension Verbalized understanding;Returned demonstration;Need further instruction          PT Short Term Goals - 06/11/14 1645    PT SHORT TERM GOAL #1   Title Demonstrate correct performance of HEP Target 07/09/14   PT SHORT TERM GOAL #2   Title Complete Berg Balance test and write appropriate STG and LTG:_________________   PT SHORT TERM GOAL #3   Title Complete Pain disability index (on paper as it was not included on FOTO)  and write appropriate STG and LTG _________________   PT SHORT TERM GOAL #4   Title Report increased ability to rest at night when in sidelying due to decreased hip (likely IT band) pain. Target 07/09/14            PT Long Term Goals - 06/11/14 1648    PT LONG TERM GOAL #1   Title Verbalize understading of fall prevention education Target 08/10/14   PT LONG TERM GOAL #2   Title Berg goal:_________________Target 08/10/14   PT LONG TERM GOAL #3   Title Pain disability index goal___________________Target 08/10/14   PT LONG TERM GOAL #4   Title Report increased ease with negotiating stairs for improved home access. Target 08/10/14   PT LONG TERM GOAL #5   Title Increase FOTO ABC goal to 35% for increased balance confidence. Target 08/10/14  Plan - 06/16/14 1324    Clinical Impression Statement Oswestry disability for back questionaire issued with score of 42% (severe disability). Also issued HEP for balance. Pt demo'd right knee instability/weakness with descending stairs in reciprocal pattern. Improved with step to pattern and leading with right leg so left leg was performing load of task.  Pt making steady progress toward goals.   Rehab Potential Good   PT Frequency 2x / week   PT Duration 8 weeks   PT Treatment/Interventions ADLs/Self Care Home Management;Moist Heat;Manual techniques;Balance training;Gait training;Therapeutic exercise;Therapeutic activities;Neuromuscular re-education;Stair training;Patient/family education  possible orthotics   PT Next Visit Plan Continue with strengthening, balance and flexibility activites.        Problem List Patient Active Problem List   Diagnosis Date Noted  . Otalgia of right ear 03/19/2014  . Other fatigue 03/05/2014  . Prediabetes 09/09/2012  . Hyperlipidemia   . Depression with anxiety 02/21/2011  . Osteoarthritis 02/21/2011  . Hypothyroidism 05/06/2006  . Essential hypertension 05/06/2006  . KNEE PAIN 05/06/2006    [name] 06/17/2014, 9:04 AM  [name], PTA, [facility] [address] [place], [place] [address] [phone] 06/17/2014, 9:04 AM

## 2014-06-17 ENCOUNTER — Encounter: Payer: Self-pay | Admitting: Physical Therapy

## 2014-06-17 ENCOUNTER — Other Ambulatory Visit: Payer: Self-pay | Admitting: Internal Medicine

## 2014-06-17 ENCOUNTER — Ambulatory Visit: Payer: No Typology Code available for payment source | Admitting: Physical Therapy

## 2014-06-17 DIAGNOSIS — M25561 Pain in right knee: Secondary | ICD-10-CM

## 2014-06-17 DIAGNOSIS — R269 Unspecified abnormalities of gait and mobility: Secondary | ICD-10-CM

## 2014-06-17 NOTE — Therapy (Signed)
[facility] [address] [place], [place], [address] Phone: [phone]   Fax:  [phone]  Physical Therapy Treatment  Patient Details  Name: Joanna Gilbert MRN: [id number] Date of Birth: [birth year] Referring Provider:  Clinton Gallant, MD  Encounter Date: 06/17/2014      PT End of Session - 06/17/14 1857    Visit Number 3   Number of Visits 17   Date for PT Re-Evaluation 08/10/14   Authorization Type GCCN expires 09/08/14   PT Start Time 1315   PT Stop Time 1400   PT Time Calculation (min) 45 min      Past Medical History  Diagnosis Date  . Hypertension   . Hypothyroidism   . Fibroid uterus 05/06/2006    Nov 2011: total abdominal hysterectomy with b/l salpingoopherectomy by Dr. Mora Bellman for symptomatic fibroids.  Qualifier: Diagnosis of  By: [name] MD, [name]    . Osteoarthritis 02/21/2011    MR b/l Hips (2009): Mild to moderate bilateral hip osteoarthritis, more notable on the right, with likely associated small area of anterior acetabular labral tearing.  MR R knee (2009): Dominant finding is marked interval progression in lateral compartment degenerative disease with new tearing in the posterior horn of the lateral meniscus. Persistent perimeniscal cyst formation related to lateral meniscal tearing as describe above MR Lumbar spine (2009) : 1. Dominant finding is advanced facet degenerative change L4-5 with small facet joint effusions and bony edema about the joints. This could be a source of back pain.  2. Negative for notable central canal and foraminal stenosis in patient with a somewhat congenitally narrowed central canal.    . Depression 02/21/2011  . Hyperlipidemia   . Prediabetes   . Plantar fasciitis, bilateral   . History of colonic polyps   . Diverticulosis     Past Surgical History  Procedure Laterality Date  . Total abdominal hysterectomy w/ bilateral salpingoophorectomy  03/2010    because of  symptomatic fibroids, Dr. Mora Bellman  . Knee surgery Right 2007, 2009    Had cyst removed  . Gastric restriction surgery  1980  . Cholecystectomy      There were no vitals taken for this visit.  Visit Diagnosis:  Right knee pain  Abnormality of gait      Subjective Assessment - 06/17/14 1852    Symptoms Pt. reports having right knee pain today - not having any major balance problems; states the pain limits her mobility   Currently in Pain? Yes   Pain Score 3    Pain Location Knee   Pain Orientation Right   Pain Descriptors / Indicators Discomfort   Pain Type Chronic pain          [facility] PT Assessment - 06/17/14 0001    Observation/Other Assessments   Oswestry Disability Index  42%     06-17-14 Treatment:  Gait:  Pt. Gait trained 120' x 3 reps with small heel wedge placed in left shoe to accomodate for leg length discrepancy Cues given for step length and upright posture; pt. Reported improvement and feeling more even with use of heel wedge Step training with use of 2 hand rails 4 steps step over step ascending and descending backwards due to c/o right knee  Pain with coming down steps forward; pt. perfomed 2nd rep with descending steps sideways with use of right rail with  SBA  TherEx:  Leg press seat 11 50# 2 sets of 10 reps with bil. LE's  Sit to stand without UE support x 5 reps;  Heel raises x 10 reps;  R LAQ seated with red theraband x 10 reps; seated R knee Flexion with red theraband x 10 reps; step ups RLE 10 reps to 6" step for quad strengthening SciFit level 1.3 x 8" with UE's and LE's          [facility] Adult PT Treatment/Exercise - 06/16/14 1325    Ambulation/Gait   Stairs Yes   Stairs Assistance 4: Min guard;5: Supervision   Stair Management Technique Step to pattern;Alternating pattern;One rail Left   Number of Stairs 4  x3 reps   Berg Balance Test   Sit to Stand Able to stand without using hands and stabilize independently   Standing Unsupported  Able to stand safely 2 minutes   Sitting with Back Unsupported but Feet Supported on Floor or Stool Able to sit safely and securely 2 minutes   Stand to Sit Sits safely with minimal use of hands   Transfers Able to transfer safely, minor use of hands   Standing Unsupported with Eyes Closed Able to stand 10 seconds with supervision   Standing Ubsupported with Feet Together Able to place feet together independently and stand for 1 minute with supervision   From Standing, Reach Forward with Outstretched Arm Can reach forward >12 cm safely (5")  8 inches   From Standing Position, Pick up Object from Floor Able to pick up shoe safely and easily   From Standing Position, Turn to Look Behind Over each Shoulder Looks behind from both sides and weight shifts well   Turn 360 Degrees Able to turn 360 degrees safely in 4 seconds or less  right 1.97, left 2.19   Standing Unsupported, Alternately Place Feet on Step/Stool Able to stand independently and safely and complete 8 steps in 20 seconds  9.94 sec   Standing Unsupported, One Foot in Front Able to plae foot ahead of the other independently and hold 30 seconds   Standing on One Leg Able to lift leg independently and hold equal to or more than 3 seconds  left stance only; right lifts unable to hold.   Total Score 50                PT Education - 06/17/14 1856    Education provided Yes   Education Details seated R LAQ and knee flexion with red theraband - pt. was given 1 yard of theraband   Person(s) Educated Patient   Methods Explanation;Demonstration;Handout   Comprehension Verbalized understanding;Returned demonstration          PT Short Term Goals - 06/11/14 1645    PT SHORT TERM GOAL #1   Title Demonstrate correct performance of HEP Target 07/09/14   PT SHORT TERM GOAL #2   Title Complete Berg Balance test and write appropriate STG and LTG:_________________   PT SHORT TERM GOAL #3   Title Complete Pain disability index (on paper  as it was not included on FOTO)  and write appropriate STG and LTG _________________   PT SHORT TERM GOAL #4   Title Report increased ability to rest at night when in sidelying due to decreased hip (likely IT band) pain. Target 07/09/14           PT Long Term Goals - 06/11/14 1648    PT LONG TERM GOAL #1   Title Verbalize understading of fall prevention education Target 08/10/14   PT LONG TERM GOAL #2   Title Berg goal:_________________Target  08/10/14   PT LONG TERM GOAL #3   Title Pain disability index goal___________________Target 08/10/14   PT LONG TERM GOAL #4   Title Report increased ease with negotiating stairs for improved home access. Target 08/10/14   PT LONG TERM GOAL #5   Title Increase FOTO ABC goal to 35% for increased balance confidence. Target 08/10/14               Plan - 06/17/14 1858    Clinical Impression Statement Pt. noted to have a leg length discrepancy with RLE slightly longer than LLE; pt. stands with L knee hyperextended and right knee flexed - states she has stood this way since her knee surgery; small heel wedge placed in left shoe and pt. stated she felt much better walking - "feels more even"   Rehab Potential Good   PT Frequency 2x / week   PT Duration 8 weeks   PT Treatment/Interventions ADLs/Self Care Home Management;Moist Heat;Manual techniques;Balance training;Gait training;Therapeutic exercise;Therapeutic activities;Neuromuscular re-education;Stair training;Patient/family education   PT Next Visit Plan Give pics of LAQ with theraband seated; check updated HEP of knee flexion and ext. with red theraband; cont LE strengthening; complete STG's   Consulted and Agree with Plan of Care Patient        Problem List Patient Active Problem List   Diagnosis Date Noted  . Otalgia of right ear 03/19/2014  . Other fatigue 03/05/2014  . Prediabetes 09/09/2012  . Hyperlipidemia   . Depression with anxiety 02/21/2011  . Osteoarthritis 02/21/2011  .  Hypothyroidism 05/06/2006  . Essential hypertension 05/06/2006  . KNEE PAIN 05/06/2006    Alda Lea, PT 06/17/2014, 7:05 PM  [facility] [address] [place], [place], [address] Phone: [phone]   Fax:  [phone]

## 2014-06-17 NOTE — Patient Instructions (Addendum)
(  Home) Knee: Extension - SittiCopyright  VHI. All rights reserved.  (Clinic) Knee: Flexion / Hamstring Curl - Prone   Seated right knee flexion with red theraband - 10 reps Seated right LAQ with red theraband - 10 reps   Copyright  VHI. All rights reserved.

## 2014-06-21 ENCOUNTER — Encounter: Payer: No Typology Code available for payment source | Admitting: Physical Therapy

## 2014-06-23 ENCOUNTER — Ambulatory Visit: Payer: No Typology Code available for payment source | Admitting: Physical Therapy

## 2014-06-23 DIAGNOSIS — M79672 Pain in left foot: Secondary | ICD-10-CM

## 2014-06-23 DIAGNOSIS — M79671 Pain in right foot: Secondary | ICD-10-CM

## 2014-06-23 DIAGNOSIS — M79605 Pain in left leg: Secondary | ICD-10-CM

## 2014-06-23 DIAGNOSIS — R279 Unspecified lack of coordination: Secondary | ICD-10-CM

## 2014-06-23 DIAGNOSIS — M25561 Pain in right knee: Secondary | ICD-10-CM

## 2014-06-23 DIAGNOSIS — R262 Difficulty in walking, not elsewhere classified: Secondary | ICD-10-CM

## 2014-06-23 DIAGNOSIS — M79604 Pain in right leg: Secondary | ICD-10-CM

## 2014-06-23 DIAGNOSIS — R269 Unspecified abnormalities of gait and mobility: Secondary | ICD-10-CM

## 2014-06-23 NOTE — Therapy (Signed)
[facility] [address] [place], [place], [address] Phone: [phone]   Fax:  [phone]  Physical Therapy Treatment  Patient Details  Name: Joanna Gilbert MRN: [id number] Date of Birth: Sep 19, 1950 Referring Provider:  Clinton Gallant, MD  Encounter Date: 06/23/2014      PT End of Session - 06/23/14 1406    Visit Number 4   Number of Visits 17   Date for PT Re-Evaluation 08/10/14   Authorization Type GCCN expires 09/08/14   PT Start Time 1402   PT Stop Time 1442   PT Time Calculation (min) 40 min   Activity Tolerance Patient tolerated treatment well   Behavior During Therapy [facility] for tasks assessed/performed      Past Medical History  Diagnosis Date  . Hypertension   . Hypothyroidism   . Fibroid uterus 05/06/2006    Nov 2011: total abdominal hysterectomy with b/l salpingoopherectomy by Dr. Mora Bellman for symptomatic fibroids.  Qualifier: Diagnosis of  By: [name] MD, [name]    . Osteoarthritis 02/21/2011    MR b/l Hips (2009): Mild to moderate bilateral hip osteoarthritis, more notable on the right, with likely associated small area of anterior acetabular labral tearing.  MR R knee (2009): Dominant finding is marked interval progression in lateral compartment degenerative disease with new tearing in the posterior horn of the lateral meniscus. Persistent perimeniscal cyst formation related to lateral meniscal tearing as describe above MR Lumbar spine (2009) : 1. Dominant finding is advanced facet degenerative change L4-5 with small facet joint effusions and bony edema about the joints. This could be a source of back pain.  2. Negative for notable central canal and foraminal stenosis in patient with a somewhat congenitally narrowed central canal.    . Depression 02/21/2011  . Hyperlipidemia   . Prediabetes   . Plantar fasciitis, bilateral   . History of colonic polyps   . Diverticulosis     Past Surgical History   Procedure Laterality Date  . Total abdominal hysterectomy w/ bilateral salpingoophorectomy  03/2010    because of symptomatic fibroids, Dr. Mora Bellman  . Knee surgery Right 2007, 2009    Had cyst removed  . Gastric restriction surgery  1980  . Cholecystectomy      There were no vitals taken for this visit.  Visit Diagnosis:  Right knee pain  Abnormality of gait  Difficulty walking down stairs  Lack of coordination  Bilateral leg and foot pain      Subjective Assessment - 06/23/14 1404    Symptoms  No new complaints. Still using heel lift and feeling it's helping. No falls.   Currently in Pain? Yes   Pain Score 3    Pain Location Knee   Pain Orientation Right   Pain Descriptors / Indicators Aching   Pain Type Chronic pain   Pain Onset More than a month ago   Pain Frequency Intermittent   Aggravating Factors  waling on hard surfaces   Pain Relieving Factors different shoes     Treatment: Manual therapy: - gentle grade 1 mobs to talocrural joint and forefoot of right foot. - passive DF stretch with knee extension and foot in neutral position. - soft tissue mobs to plantar fascia with emphasis at heel area.  Exercise: - seated with red theraband right long arc quads and hamstring curls x 10 reps each. - Leg press 60# 5 sec hold x 10 reps with assist/cues to prevent knee hyperextension. - Scifit x 4 extremities  level 2.5 x 8 minutes with goal RPM >/=40 for strengthening and activity tolerance.         PT Short Term Goals - 06/11/14 1645    PT SHORT TERM GOAL #1   Title Demonstrate correct performance of HEP Target 07/09/14   PT SHORT TERM GOAL #2   Title Complete Berg Balance test and write appropriate STG and LTG:_________________   PT SHORT TERM GOAL #3   Title Complete Pain disability index (on paper as it was not included on FOTO)  and write appropriate STG and LTG _________________   PT SHORT TERM GOAL #4   Title Report increased ability to rest at  night when in sidelying due to decreased hip (likely IT band) pain. Target 07/09/14           PT Long Term Goals - 06/11/14 1648    PT LONG TERM GOAL #1   Title Verbalize understading of fall prevention education Target 08/10/14   PT LONG TERM GOAL #2   Title Berg goal:_________________Target 08/10/14   PT LONG TERM GOAL #3   Title Pain disability index goal___________________Target 08/10/14   PT LONG TERM GOAL #4   Title Report increased ease with negotiating stairs for improved home access. Target 08/10/14   PT LONG TERM GOAL #5   Title Increase FOTO ABC goal to 35% for increased balance confidence. Target 08/10/14           Plan - 06/23/14 1407    Clinical Impression Statement Pt reported decreased foot pain after manual theray and no increased in knee pain with exercises. Pt may need new custom orthotic for right foot plantar fascitis as her old one does not come out of the shoes she had when she got it several years ago and those shoes are worn out. Will continue to monitor this need.                                   Rehab Potential Good   PT Frequency 2x / week   PT Duration 8 weeks   PT Treatment/Interventions ADLs/Self Care Home Management;Moist Heat;Manual techniques;Balance training;Gait training;Therapeutic exercise;Therapeutic activities;Neuromuscular re-education;Stair training;Patient/family education  possible orthotics   PT Next Visit Plan Continue to work on lower extremetiy strengthening   Consulted and Agree with Plan of Care Patient        Problem List Patient Active Problem List   Diagnosis Date Noted  . Otalgia of right ear 03/19/2014  . Other fatigue 03/05/2014  . Prediabetes 09/09/2012  . Hyperlipidemia   . Depression with anxiety 02/21/2011  . Osteoarthritis 02/21/2011  . Hypothyroidism 05/06/2006  . Essential hypertension 05/06/2006  . KNEE PAIN 05/06/2006    [name] 06/23/2014, 3:17 PM  [name], PTA, [facility] [address] [place], [place] [address] [phone] 06/23/2014, 3:17 PM

## 2014-06-24 ENCOUNTER — Ambulatory Visit: Payer: No Typology Code available for payment source | Admitting: Physical Therapy

## 2014-06-24 ENCOUNTER — Encounter: Payer: Self-pay | Admitting: Physical Therapy

## 2014-06-24 DIAGNOSIS — M79605 Pain in left leg: Secondary | ICD-10-CM

## 2014-06-24 DIAGNOSIS — R279 Unspecified lack of coordination: Secondary | ICD-10-CM

## 2014-06-24 DIAGNOSIS — M79672 Pain in left foot: Secondary | ICD-10-CM

## 2014-06-24 DIAGNOSIS — R262 Difficulty in walking, not elsewhere classified: Secondary | ICD-10-CM

## 2014-06-24 DIAGNOSIS — R269 Unspecified abnormalities of gait and mobility: Secondary | ICD-10-CM

## 2014-06-24 DIAGNOSIS — M79604 Pain in right leg: Secondary | ICD-10-CM

## 2014-06-24 DIAGNOSIS — M25561 Pain in right knee: Secondary | ICD-10-CM

## 2014-06-24 DIAGNOSIS — M79671 Pain in right foot: Secondary | ICD-10-CM

## 2014-06-24 NOTE — Therapy (Signed)
[facility] [address] [place], [place], [address] Phone: [phone]   Fax:  [phone]  Physical Therapy Treatment  Patient Details  Name: Joanna Gilbert MRN: [id number] Date of Birth: 1951/02/16 Referring Provider:  Clinton Gallant, MD  Encounter Date: 06/24/2014      PT End of Session - 06/24/14 1525    Visit Number 5   Number of Visits 17   Date for PT Re-Evaluation 08/10/14   Authorization Type GCCN expires 09/08/14   PT Start Time 1445   PT Stop Time 1527   PT Time Calculation (min) 42 min   Activity Tolerance Patient tolerated treatment well   Behavior During Therapy [facility] for tasks assessed/performed      Past Medical History  Diagnosis Date  . Hypertension   . Hypothyroidism   . Fibroid uterus 05/06/2006    Nov 2011: total abdominal hysterectomy with b/l salpingoopherectomy by Dr. Mora Bellman for symptomatic fibroids.  Qualifier: Diagnosis of  By: [name] MD, [name]    . Osteoarthritis 02/21/2011    MR b/l Hips (2009): Mild to moderate bilateral hip osteoarthritis, more notable on the right, with likely associated small area of anterior acetabular labral tearing.  MR R knee (2009): Dominant finding is marked interval progression in lateral compartment degenerative disease with new tearing in the posterior horn of the lateral meniscus. Persistent perimeniscal cyst formation related to lateral meniscal tearing as describe above MR Lumbar spine (2009) : 1. Dominant finding is advanced facet degenerative change L4-5 with small facet joint effusions and bony edema about the joints. This could be a source of back pain.  2. Negative for notable central canal and foraminal stenosis in patient with a somewhat congenitally narrowed central canal.    . Depression 02/21/2011  . Hyperlipidemia   . Prediabetes   . Plantar fasciitis, bilateral   . History of colonic polyps   . Diverticulosis     Past Surgical History   Procedure Laterality Date  . Total abdominal hysterectomy w/ bilateral salpingoophorectomy  03/2010    because of symptomatic fibroids, Dr. Mora Bellman  . Knee surgery Right 2007, 2009    Had cyst removed  . Gastric restriction surgery  1980  . Cholecystectomy      There were no vitals taken for this visit.  Visit Diagnosis:  Right knee pain  Abnormality of gait  Difficulty walking down stairs  Lack of coordination  Bilateral leg and foot pain      Subjective Assessment - 06/24/14 1449    Symptoms No falls. Reports increased lower back to tailbone/hip pain after doing standing exercises at home. Feet feel better todayl.   Currently in Pain? Yes   Pain Score 3    Pain Location Knee  hip   Pain Orientation Right   Pain Descriptors / Indicators Tender;Aching   Pain Type Chronic pain   Pain Onset More than a month ago   Pain Frequency Intermittent   Aggravating Factors  exercises, walking on hard surfaces   Pain Relieving Factors rest     Treatment: Exercises - hooklying on mat: bridge 5 sec hold x 10; with red theraband: bridge with alternating marches x 10 each leg, bridge with hip abduction/ER x 10 reps. Straight leg raises 5 sec hold x 10 reps each leg.  - sidelying: hip abduction x 10 reps and clam shell x 10 reps to both sides.  All the above issued as new HEP  Scifit x 4 extremities level  2.5 x 10 minutes with goal rpm >/= 55 for strengthening and activity tolerance.         PT Short Term Goals - 06/11/14 1645    PT SHORT TERM GOAL #1   Title Demonstrate correct performance of HEP Target 07/09/14   PT SHORT TERM GOAL #2   Title Complete Berg Balance test and write appropriate STG and LTG:_________________   PT SHORT TERM GOAL #3   Title Complete Pain disability index (on paper as it was not included on FOTO)  and write appropriate STG and LTG _________________   PT SHORT TERM GOAL #4   Title Report increased ability to rest at night when in  sidelying due to decreased hip (likely IT band) pain. Target 07/09/14           PT Long Term Goals - 06/11/14 1648    PT LONG TERM GOAL #1   Title Verbalize understading of fall prevention education Target 08/10/14   PT LONG TERM GOAL #2   Title Berg goal:_________________Target 08/10/14   PT LONG TERM GOAL #3   Title Pain disability index goal___________________Target 08/10/14   PT LONG TERM GOAL #4   Title Report increased ease with negotiating stairs for improved home access. [employer] 08/10/14   PT LONG TERM GOAL #5   Title Increase FOTO ABC goal to 35% for increased balance confidence. Target 08/10/14           Plan - 06/24/14 1527    Clinical Impression Statement Pt with no foot pain today, only knee and hip pain caused by her standing HEP. Reported she always hurts in her back and hip after performing them. Held all current HEP exercises except single leg standing with light UE support and knee exercises with red theraband  (long arc quad and hamstring curls). Added new mat exercises today for hip, back/core and lower leg strengthening. Pt without complaints after performing them in session today.                              Rehab Potential Good   PT Frequency 2x / week   PT Duration 8 weeks   PT Treatment/Interventions ADLs/Self Care Home Management;Moist Heat;Manual techniques;Balance training;Gait training;Therapeutic exercise;Therapeutic activities;Neuromuscular re-education;Stair training;Patient/family education  possible orthotics   PT Next Visit Plan Continue to work on lower extremetiy strengthening. ? orthotic order for insert to assist with plantar fascitis pain.   Consulted and Agree with Plan of Care Patient      Problem List Patient Active Problem List   Diagnosis Date Noted  . Otalgia of right ear 03/19/2014  . Other fatigue 03/05/2014  . Prediabetes 09/09/2012  . Hyperlipidemia   . Depression with anxiety 02/21/2011  . Osteoarthritis 02/21/2011  .  Hypothyroidism 05/06/2006  . Essential hypertension 05/06/2006  . KNEE PAIN 05/06/2006    [name] 06/24/2014, 3:30 PM  [name], PTA, [facility] [address] [place], [place] [address] [phone] 06/24/2014, 3:30 PM

## 2014-06-24 NOTE — Patient Instructions (Signed)
Heel Squeeze (Prone)   Abdomen supported, bend knees and gently squeeze heels together. Hold _5___ seconds. Repeat __10__ times per set. Do __1__ sets per session. Do _1-2___ sessions per day.  http://orth.exer.us/1080   Copyright  VHI. All rights reserved.  Bridging   Slowly raise buttocks from floor, keeping stomach tight. Hold 5 seconds. Repeat __10__ times per set. Do __1__ sets per session. Do __1-2__ sessions per day.  http://orth.exer.us/1096   Copyright  VHI. All rights reserved.  Bent Leg Lift (Hook-Lying)   In bridge as above: Tighten stomach and slowly raise right leg __1-2__ inches from floor. Keep trunk rigid. Hold __3__ seconds. Alternate legs with red band around legs above knees. Repeat __10__ times each leg.  Do _1-2___ sessions per day.  http://orth.exer.us/1090   Copyright  VHI. All rights reserved.  Strengthening: Hip Abductor - Resisted   In bridge position with band looped around both legs above knees, push thighs apart. Repeat __10__ times per set. Do __1__ sets per session. Do _1-2___ sessions per day.  http://orth.exer.us/688   Copyright  VHI. All rights reserved.  Strengthening: Straight Leg Raise (Phase 1)   Tighten muscles on front of right thigh, then lift leg _2-3 ___ inches from surface, keeping knee locked. Hold for 5 seconds.  Repeat 10____ times per leg. Do __1-2__ sessions per day.  http://orth.exer.us/614   Copyright  VHI. All rights reserved.  Strengthening: Hip Abduction (Side-Lying)   Tighten muscles on front of left thigh, then lift leg __2-3__ inches from surface, keeping knee locked.  Hold for 5 seconds. Repeat__10_ times each leg.  Do __1-2 sessions per day.  http://orth.exer.us/622   Copyright  VHI. All rights reserved.  Abduction: Clam (Eccentric) - Side-Lying   Lie on side with knees bent. Lift top knee, keeping feet together. Keep trunk steady. Hold for 3-5 seconds. __10 reps each side.  Copyright  VHI.  All rights reserved.

## 2014-06-28 ENCOUNTER — Ambulatory Visit: Payer: No Typology Code available for payment source

## 2014-06-29 ENCOUNTER — Encounter: Payer: Self-pay | Admitting: Internal Medicine

## 2014-07-02 ENCOUNTER — Encounter: Payer: Self-pay | Admitting: Physical Therapy

## 2014-07-02 ENCOUNTER — Ambulatory Visit: Payer: No Typology Code available for payment source | Attending: Internal Medicine | Admitting: Physical Therapy

## 2014-07-02 DIAGNOSIS — R269 Unspecified abnormalities of gait and mobility: Secondary | ICD-10-CM

## 2014-07-02 DIAGNOSIS — M79671 Pain in right foot: Secondary | ICD-10-CM

## 2014-07-02 DIAGNOSIS — M79604 Pain in right leg: Secondary | ICD-10-CM | POA: Insufficient documentation

## 2014-07-02 DIAGNOSIS — R279 Unspecified lack of coordination: Secondary | ICD-10-CM | POA: Insufficient documentation

## 2014-07-02 DIAGNOSIS — M79605 Pain in left leg: Secondary | ICD-10-CM | POA: Insufficient documentation

## 2014-07-02 DIAGNOSIS — M25561 Pain in right knee: Secondary | ICD-10-CM

## 2014-07-02 DIAGNOSIS — M79672 Pain in left foot: Secondary | ICD-10-CM

## 2014-07-02 DIAGNOSIS — R262 Difficulty in walking, not elsewhere classified: Secondary | ICD-10-CM | POA: Insufficient documentation

## 2014-07-02 NOTE — Patient Instructions (Signed)
Calf Stretch   Stand with hands supported on wall, elbows slightly bent, front knee bent, back knee straight, feet parallel and both heels on floor. Lean into wall by pushing hips forward until a stretch is felt in calf muscle. Hold __20__ seconds. Repeat with leg positions switched. 3 times each leg.  Copyright  VHI. All rights reserved.  Chair Sitting   Sit at edge of seat, spine straight, one leg extended. Put a hand on each thigh and bend forward from the hip, keeping spine straight. Allow hand on extended leg to reach toward toes. Support upper body with other arm. Hold _20__ seconds. Repeat _3__ times each leg. Do _1-2__ sessions per day.  Copyright  VHI. All rights reserved.  Healthy Back Stretch - Standing   Seated: Twist at waist as far as possible to the right, reaching behind you with the right arm. Hold for 5 seconds. Repeat to left side with left arm reaching behind you. Do not move/lift hips from mat.   Copyright  VHI. All rights reserved.

## 2014-07-02 NOTE — Therapy (Signed)
[facility] [address] [place], [place], [address] Phone: [phone]   Fax:  [phone]  Physical Therapy Treatment  Patient Details  Name: Joanna Gilbert MRN: [id number] Date of Birth: [birth year] Referring Provider:  Clinton Gallant, MD  Encounter Date: 07/02/2014      PT End of Session - 07/02/14 1324    Visit Number 6   Number of Visits 17   Date for PT Re-Evaluation 08/10/14   Authorization Type GCCN expires 09/08/14   PT Start Time 1316   PT Stop Time 1357   PT Time Calculation (min) 41 min   Activity Tolerance Patient tolerated treatment well   Behavior During Therapy [facility] for tasks assessed/performed      Past Medical History  Diagnosis Date  . Hypertension   . Hypothyroidism   . Fibroid uterus 05/06/2006    Nov 2011: total abdominal hysterectomy with b/l salpingoopherectomy by Dr. Mora Bellman for symptomatic fibroids.  Qualifier: Diagnosis of  By: [name] MD, [name]    . Osteoarthritis 02/21/2011    MR b/l Hips (2009): Mild to moderate bilateral hip osteoarthritis, more notable on the right, with likely associated small area of anterior acetabular labral tearing.  MR R knee (2009): Dominant finding is marked interval progression in lateral compartment degenerative disease with new tearing in the posterior horn of the lateral meniscus. Persistent perimeniscal cyst formation related to lateral meniscal tearing as describe above MR Lumbar spine (2009) : 1. Dominant finding is advanced facet degenerative change L4-5 with small facet joint effusions and bony edema about the joints. This could be a source of back pain.  2. Negative for notable central canal and foraminal stenosis in patient with a somewhat congenitally narrowed central canal.    . Depression 02/21/2011  . Hyperlipidemia   . Prediabetes   . Plantar fasciitis, bilateral   . History of colonic polyps   . Diverticulosis     Past Surgical History   Procedure Laterality Date  . Total abdominal hysterectomy w/ bilateral salpingoophorectomy  03/2010    because of symptomatic fibroids, Dr. Mora Bellman  . Knee surgery Right 2007, 2009    Had cyst removed  . Gastric restriction surgery  1980  . Cholecystectomy      There were no vitals taken for this visit.  Visit Diagnosis:  Abnormality of gait  Right knee pain  Difficulty walking down stairs  Lack of coordination  Bilateral leg and foot pain      Subjective Assessment - 07/02/14 1319    Symptoms No falls. Had excerbation of bil feet pain (right > left) monday into tuesday. Better today. Reports having more hip issues than feet issues, that started after the addition of the insert and the HEP. Pt not sure but thinks it's more the exercises as she only hurts after doing them and not with walking/standing in general.     Currently in Pain? Yes   Pain Score 2    Pain Location Foot   Pain Orientation Right;Left   Pain Descriptors / Indicators Aching;Tender;Sore   Pain Type Chronic pain   Pain Onset More than a month ago   Pain Frequency Intermittent   Aggravating Factors  exercises, walking on hard surfaces   Pain Relieving Factors rest     Treatment: Exercise - supine on mat: bridge 5 sec hold x 10 reps; bridge with clamshell with red band x 10 reps. - side lying clamshell red band 5 sec hold x 10 reps. -  prone heel presses 5 sec hold x 10 reps - standing at counter: with red band around ankles: alternating hip abduction, extension and flexion with knee extension x 10 reps each bil legs.  stretches (added to HEP):  - wall calf stretch 20 sec x 3 each leg - seated hamstring stretch 20 sec x 3 each leg - seated upper trunk rotation stretch 5 sec x 3 each way.        PT Short Term Goals - 06/11/14 1645    PT SHORT TERM GOAL #1   Title Demonstrate correct performance of HEP Target 07/09/14   PT SHORT TERM GOAL #2   Title Complete Berg Balance test and write  appropriate STG and LTG:_________________   PT SHORT TERM GOAL #3   Title Complete Pain disability index (on paper as it was not included on FOTO)  and write appropriate STG and LTG _________________   PT SHORT TERM GOAL #4   Title Report increased ability to rest at night when in sidelying due to decreased hip (likely IT band) pain. Target 07/09/14           PT Long Term Goals - 06/11/14 1648    PT LONG TERM GOAL #1   Title Verbalize understading of fall prevention education Target 08/10/14   PT LONG TERM GOAL #2   Title Berg goal:_________________Target 08/10/14   PT LONG TERM GOAL #3   Title Pain disability index goal___________________Target 08/10/14   PT LONG TERM GOAL #4   Title Report increased ease with negotiating stairs for improved home access. [employer] 08/10/14   PT LONG TERM GOAL #5   Title Increase FOTO ABC goal to 35% for increased balance confidence. Target 08/10/14          Plan - 07/02/14 1324    Clinical Impression Statement Focused on hip strengthening and stability today. Also issued stretches for HEP. Pt without compliants of foot pain after session and mild increase in hip pain, 3/10.   Rehab Potential Good   PT Frequency 2x / week   PT Duration 8 weeks   PT Treatment/Interventions ADLs/Self Care Home Management;Moist Heat;Manual techniques;Balance training;Gait training;Therapeutic exercise;Therapeutic activities;Neuromuscular re-education;Stair training;Patient/family education  possible orthotics   PT Next Visit Plan Continue to work on lower extremetiy strengthening. ? orthotic order for insert to assist with plantar fascitis pain/hip pain   Consulted and Agree with Plan of Care Patient        Problem List Patient Active Problem List   Diagnosis Date Noted  . Otalgia of right ear 03/19/2014  . Other fatigue 03/05/2014  . Prediabetes 09/09/2012  . Hyperlipidemia   . Depression with anxiety 02/21/2011  . Osteoarthritis 02/21/2011  . Hypothyroidism  05/06/2006  . Essential hypertension 05/06/2006  . KNEE PAIN 05/06/2006    [name] 07/02/2014, 5:57 PM  [name], PTA, [facility] [address] [place], [place] [address] [phone] 07/02/2014, 5:57 PM

## 2014-07-05 ENCOUNTER — Ambulatory Visit: Payer: No Typology Code available for payment source

## 2014-07-05 DIAGNOSIS — M79671 Pain in right foot: Secondary | ICD-10-CM

## 2014-07-05 DIAGNOSIS — M25561 Pain in right knee: Secondary | ICD-10-CM

## 2014-07-05 DIAGNOSIS — M79672 Pain in left foot: Secondary | ICD-10-CM

## 2014-07-05 DIAGNOSIS — M79605 Pain in left leg: Secondary | ICD-10-CM

## 2014-07-05 DIAGNOSIS — M79604 Pain in right leg: Secondary | ICD-10-CM

## 2014-07-05 DIAGNOSIS — R262 Difficulty in walking, not elsewhere classified: Secondary | ICD-10-CM

## 2014-07-05 DIAGNOSIS — R269 Unspecified abnormalities of gait and mobility: Secondary | ICD-10-CM

## 2014-07-05 DIAGNOSIS — R279 Unspecified lack of coordination: Secondary | ICD-10-CM

## 2014-07-05 NOTE — Therapy (Signed)
[facility] [address] [place], [place], [address] Phone: [phone]   Fax:  [phone]  Physical Therapy Treatment  Patient Details  Name: Joanna Gilbert MRN: [id number] Date of Birth: Nov 25, 1950 Referring Provider:  Clinton Gallant, MD  Encounter Date: 07/05/2014      PT End of Session - 07/05/14 1646    Visit Number 7   Number of Visits 17   Date for PT Re-Evaluation 08/10/14   Authorization Type GCCN expires 09/08/14   PT Start Time 1410   PT Stop Time 1450   PT Time Calculation (min) 40 min      Past Medical History  Diagnosis Date  . Hypertension   . Hypothyroidism   . Fibroid uterus 05/06/2006    Nov 2011: total abdominal hysterectomy with b/l salpingoopherectomy by Dr. Mora Bellman for symptomatic fibroids.  Qualifier: Diagnosis of  By: [name] MD, [name]    . Osteoarthritis 02/21/2011    MR b/l Hips (2009): Mild to moderate bilateral hip osteoarthritis, more notable on the right, with likely associated small area of anterior acetabular labral tearing.  MR R knee (2009): Dominant finding is marked interval progression in lateral compartment degenerative disease with new tearing in the posterior horn of the lateral meniscus. Persistent perimeniscal cyst formation related to lateral meniscal tearing as describe above MR Lumbar spine (2009) : 1. Dominant finding is advanced facet degenerative change L4-5 with small facet joint effusions and bony edema about the joints. This could be a source of back pain.  2. Negative for notable central canal and foraminal stenosis in patient with a somewhat congenitally narrowed central canal.    . Depression 02/21/2011  . Hyperlipidemia   . Prediabetes   . Plantar fasciitis, bilateral   . History of colonic polyps   . Diverticulosis     Past Surgical History  Procedure Laterality Date  . Total abdominal hysterectomy w/ bilateral salpingoophorectomy  03/2010    because of  symptomatic fibroids, Dr. Mora Bellman  . Knee surgery Right 2007, 2009    Had cyst removed  . Gastric restriction surgery  1980  . Cholecystectomy      There were no vitals taken for this visit.  Visit Diagnosis:  Abnormality of gait  Right knee pain  Difficulty walking down stairs  Lack of coordination  Bilateral leg and foot pain      Subjective Assessment - 07/05/14 1412    Symptoms No falls. She reports her pain is worse if she walks barefoot. She says she's trying to remember to wear the shoes more.   Currently in Pain? Yes   Pain Score 3    Pain Location Foot   Pain Orientation Right     Gait training: Stairs training with 6" and 4" steps and BUE support on rails. Pt ascends with ease, but has increased pain and poor control with descending stairs due to pelvic drop (weak gluteus medius and poor eccentric quad control and poor ankle stability). Used mirror with training to educate pt on impairments.  Self Care Discussed therapy recommendations to wear footwear whether she is inside or outside as she needs support for her feet to decrease the plantar faciitis pain. Also discussed pt's need for proper support of her feet and recommended that pt bring her orthotic shoes (which she reports are very old to determine benefit of wearing these vs need for new orthotics).  Gait assessment with and without shoes notable pronation bilateral and bilateral pelvic drop  Therex: Taught pt a HEP to address IT band pain. See pt instructions for details. Pt performed the exercises in therapy today with good return demo.                      PT Short Term Goals - 06/11/14 1645    PT SHORT TERM GOAL #1   Title Demonstrate correct performance of HEP Target 07/09/14   PT SHORT TERM GOAL #2   Title Complete Berg Balance test and write appropriate STG and LTG:_________________   PT SHORT TERM GOAL #3   Title Complete Pain disability index (on paper as it was not  included on FOTO)  and write appropriate STG and LTG _________________   PT SHORT TERM GOAL #4   Title Report increased ability to rest at night when in sidelying due to decreased hip (likely IT band) pain. [employer] 07/09/14           PT Long Term Goals - 06/11/14 1648    PT LONG TERM GOAL #1   Title Verbalize understading of fall prevention education [employer] 08/10/14   PT LONG TERM GOAL #2   Title Berg goal:_________________Target 08/10/14   PT LONG TERM GOAL #3   Title Pain disability index goal___________________Target 08/10/14   PT LONG TERM GOAL #4   Title Report increased ease with negotiating stairs for improved home access. [employer] 08/10/14   PT LONG TERM GOAL #5   Title Increase FOTO ABC goal to 35% for increased balance confidence. Target 08/10/14               Plan - 07/05/14 1646    Clinical Impression Statement Pt not performing gluteus medius strengthening correctly. Reviewed clamshells and gave these to pt again today with HEP to address IT band pain. Pt's ankle and hip instability contribute to her plantar faciitis and IT band pain.   PT Next Visit Plan Check goals: Review IT band HEP. Pt to bring orthotics/footwear for trial. Make appropriate recommendaiton.        Problem List Patient Active Problem List   Diagnosis Date Noted  . Otalgia of right ear 03/19/2014  . Other fatigue 03/05/2014  . Prediabetes 09/09/2012  . Hyperlipidemia   . Depression with anxiety 02/21/2011  . Osteoarthritis 02/21/2011  . Hypothyroidism 05/06/2006  . Essential hypertension 05/06/2006  . KNEE PAIN 05/06/2006    Delrae Sawyers D 07/05/2014, 4:50 PM  [facility] [address] [place], [place], [address] Phone: [phone]   Fax:  [phone]

## 2014-07-05 NOTE — Patient Instructions (Signed)
Clam Shell 45 Degrees   Lying on your side with a 1/4th forward roll with hips and knees bent 45. Lift knee 3". Be sure pelvis does not roll backward. Do not arch back. Do 3 sets of 10 reps, each leg, every other day.  http://ss.exer.us/75   Copyright  VHI. All rights reserved.    IT Band: Wall Lean With Crossed Leg   Stand with right hand on wall (or hold onto a chair). Cross right leg behind other leg. Stretch right hip toward wall with other arm supporting trunk (or holding chair). Hold 30 seconds. Relax. Repeat 3 times each leg. Do 2 sets per  Day. (morning and night) Repeat on other side.  Copyright  VHI. All rights reserved.   Hip Flexor Stretch   Lying on back near edge of bed, bend one leg, foot flat. Hang other leg over edge, relaxed, bend knee until a gentle stretch is felt. Keep foot supported with a stack of books or the floor. Hold 3 minutes.  Repeat on other leg. Do 2 sessions per day (morning and night). Advanced Exercise: Bend knee back keeping thigh in contact with bed.  http://gt2.exer.us/347   Copyright  VHI. All rights reserved.  Gluteal Stretch--don't use a ball    Lie supine, foot flat on bed and, other ankle crossed over knee. Push your knee away from your body until a gentle stretch is felt. Peform 3x30 seconds on each leg morning and night. Copyright  VHI. All rights reserved.

## 2014-07-07 ENCOUNTER — Ambulatory Visit: Payer: No Typology Code available for payment source

## 2014-07-07 DIAGNOSIS — M79605 Pain in left leg: Secondary | ICD-10-CM

## 2014-07-07 DIAGNOSIS — M79604 Pain in right leg: Secondary | ICD-10-CM

## 2014-07-07 DIAGNOSIS — M25561 Pain in right knee: Secondary | ICD-10-CM

## 2014-07-07 DIAGNOSIS — R269 Unspecified abnormalities of gait and mobility: Secondary | ICD-10-CM

## 2014-07-07 DIAGNOSIS — R262 Difficulty in walking, not elsewhere classified: Secondary | ICD-10-CM

## 2014-07-07 DIAGNOSIS — M79672 Pain in left foot: Secondary | ICD-10-CM

## 2014-07-07 DIAGNOSIS — M79671 Pain in right foot: Secondary | ICD-10-CM

## 2014-07-07 DIAGNOSIS — R279 Unspecified lack of coordination: Secondary | ICD-10-CM

## 2014-07-07 NOTE — Therapy (Signed)
[facility] [address] [place], [place], [address] Phone: [phone]   Fax:  [phone]  Physical Therapy Treatment  Patient Details  Name: Joanna Gilbert MRN: [id number] Date of Birth: [birth year] Referring Provider:  Clinton Gallant, MD  Encounter Date: 07/07/2014      PT End of Session - 07/07/14 1556    Visit Number 8   Number of Visits 17   Date for PT Re-Evaluation 08/10/14   Authorization Type GCCN expires 09/08/14   PT Start Time 1318   PT Stop Time 1400   PT Time Calculation (min) 42 min      Past Medical History  Diagnosis Date  . Hypertension   . Hypothyroidism   . Fibroid uterus 05/06/2006    Nov 2011: total abdominal hysterectomy with b/l salpingoopherectomy by Dr. Mora Bellman for symptomatic fibroids.  Qualifier: Diagnosis of  By: [name] MD, [name]    . Osteoarthritis 02/21/2011    MR b/l Hips (2009): Mild to moderate bilateral hip osteoarthritis, more notable on the right, with likely associated small area of anterior acetabular labral tearing.  MR R knee (2009): Dominant finding is marked interval progression in lateral compartment degenerative disease with new tearing in the posterior horn of the lateral meniscus. Persistent perimeniscal cyst formation related to lateral meniscal tearing as describe above MR Lumbar spine (2009) : 1. Dominant finding is advanced facet degenerative change L4-5 with small facet joint effusions and bony edema about the joints. This could be a source of back pain.  2. Negative for notable central canal and foraminal stenosis in patient with a somewhat congenitally narrowed central canal.    . Depression 02/21/2011  . Hyperlipidemia   . Prediabetes   . Plantar fasciitis, bilateral   . History of colonic polyps   . Diverticulosis     Past Surgical History  Procedure Laterality Date  . Total abdominal hysterectomy w/ bilateral salpingoophorectomy  03/2010    because of  symptomatic fibroids, Dr. Mora Bellman  . Knee surgery Right 2007, 2009    Had cyst removed  . Gastric restriction surgery  1980  . Cholecystectomy      There were no vitals taken for this visit.  Visit Diagnosis:  Abnormality of gait  Right knee pain  Difficulty walking down stairs  Lack of coordination  Bilateral leg and foot pain      Subjective Assessment - 07/07/14 1321    Symptoms Pain is doing a bit better.    Currently in Pain? Yes   Pain Score 2    Pain Location Foot   Pain Orientation Left   Pain Descriptors / Indicators Aching          [facility] PT Assessment - 07/07/14 0001    Observation/Other Assessments   Oswestry Disability Index  38%   Standardized Balance Assessment   Standardized Balance Assessment Berg Balance Test   Berg Balance Test   Sit to Stand Able to stand without using hands and stabilize independently   Standing Unsupported Able to stand safely 2 minutes   Sitting with Back Unsupported but Feet Supported on Floor or Stool Able to sit safely and securely 2 minutes   Stand to Sit Sits safely with minimal use of hands   Transfers Able to transfer safely, minor use of hands   Standing Unsupported with Eyes Closed Able to stand 10 seconds safely   Standing Ubsupported with Feet Together Able to place feet together independently and stand for 1  minute with supervision   From Standing, Reach Forward with Outstretched Arm Can reach confidently >25 cm (10")   From Standing Position, Pick up Object from [place] to pick up shoe safely and easily   From Standing Position, Turn to Look Behind Over each Shoulder Looks behind from both sides and weight shifts well   Turn 360 Degrees Able to turn 360 degrees safely in 4 seconds or less   Standing Unsupported, Alternately Place Feet on Step/Stool Able to stand independently and safely and complete 8 steps in 20 seconds   Standing Unsupported, One Foot in [place] to plae foot ahead of the other  independently and hold 30 seconds   Standing on One Leg Tries to lift leg/unable to hold 3 seconds but remains standing independently   Total Score 51      Reviewed HEP from prior session with pt performing each exercise as indicated with verbal cues needed: clamshells 3x10 each leg, standing IT band stretch 3x30 seconds, supine hip flexor stretch, supine gluteus stretch.  Also performed standing calf stretch each leg 3x30 seconds each leg  *extra time required to attain correct positions for each exercise and to add handwritten tips into pt's typed HEP for improved performance.                    PT Short Term Goals - 07/07/14 1324    PT SHORT TERM GOAL #1   Title Demonstrate correct performance of HEP [employer] 07/09/14   Status Not Met   PT SHORT TERM GOAL #2   Title Pt will increase Berg Balance Test to 53/56 for improved balance.   Status Not Met  51/56   PT SHORT TERM GOAL #3   Title Pt to decrease oswestry pain questionaire to 34% disabiled.    Status Not Met  score decreased to 38% (from 42%) on 07/07/14   PT SHORT TERM GOAL #4   Title Report increased ability to rest at night when in sidelying due to decreased hip (likely IT band) pain. Target 07/09/14   Status Partially Met  pt reports the pain is decreasing but it is still difficult to lay on the side and rest at night; not as bad as it was at the beginning 07/07/14           PT Long Term Goals - 07/07/14 1603    PT LONG TERM GOAL #1   Title Verbalize understading of fall prevention education Target 08/10/14   PT LONG TERM GOAL #2   Title Increase Berg balance test score to 54/56. Target 08/10/14   PT LONG TERM GOAL #3   Title Decrease Oswestry score to 24% for decreased disability due to pain. Target 08/10/14   PT LONG TERM GOAL #4   Title Report increased ease with negotiating stairs for improved home access. [employer] 08/10/14   PT LONG TERM GOAL #5   Title Increase FOTO ABC goal to 35% for increased  balance confidence. Target 08/10/14               Plan - 07/07/14 1556    Clinical Impression Statement Pt still not correctly performing gluteus medius strengthening. Will incorportate this into her PT sessions. She is reporting decreased pain since start of therapy. Continue to progress toward goals.   PT Next Visit Plan finish checking STGs, gluteus medius strengthening. Pt to bring her orthotics/footwear to therapy for appropriate recommendation to be made.        Problem List  Patient Active Problem List   Diagnosis Date Noted  . Otalgia of right ear 03/19/2014  . Other fatigue 03/05/2014  . Prediabetes 09/09/2012  . Hyperlipidemia   . Depression with anxiety 02/21/2011  . Osteoarthritis 02/21/2011  . Hypothyroidism 05/06/2006  . Essential hypertension 05/06/2006  . KNEE PAIN 05/06/2006    Delrae Sawyers, PT,DPT,NCS 07/07/2014 4:08 PM Phone [phone] FAX [phone]         [facility] [address] [place], [place], [address] Phone: [phone]   Fax:  [phone]

## 2014-07-14 ENCOUNTER — Ambulatory Visit: Payer: No Typology Code available for payment source | Admitting: Physical Therapy

## 2014-07-14 DIAGNOSIS — M79604 Pain in right leg: Secondary | ICD-10-CM

## 2014-07-14 DIAGNOSIS — M79605 Pain in left leg: Secondary | ICD-10-CM

## 2014-07-14 DIAGNOSIS — M25561 Pain in right knee: Secondary | ICD-10-CM

## 2014-07-14 DIAGNOSIS — M79672 Pain in left foot: Secondary | ICD-10-CM

## 2014-07-14 DIAGNOSIS — M79671 Pain in right foot: Secondary | ICD-10-CM

## 2014-07-14 NOTE — Therapy (Signed)
[facility] [address] [place], [place], [address] Phone: [phone]   Fax:  [phone]  Physical Therapy Treatment  Patient Details  Name: Joanna Gilbert MRN: [id number] Date of Birth: [birth year] Referring Provider:  Clinton Gallant, MD  Encounter Date: 07/14/2014      PT End of Session - 07/14/14 1538    Visit Number 9   Number of Visits 17   Date for PT Re-Evaluation 08/10/14   Authorization Type GCCN expires 09/08/14   PT Start Time 1533   Activity Tolerance Patient tolerated treatment well   Behavior During Therapy [facility] for tasks assessed/performed      Past Medical History  Diagnosis Date  . Hypertension   . Hypothyroidism   . Fibroid uterus 05/06/2006    Nov 2011: total abdominal hysterectomy with b/l salpingoopherectomy by Dr. Mora Bellman for symptomatic fibroids.  Qualifier: Diagnosis of  By: [name] MD, [name]    . Osteoarthritis 02/21/2011    MR b/l Hips (2009): Mild to moderate bilateral hip osteoarthritis, more notable on the right, with likely associated small area of anterior acetabular labral tearing.  MR R knee (2009): Dominant finding is marked interval progression in lateral compartment degenerative disease with new tearing in the posterior horn of the lateral meniscus. Persistent perimeniscal cyst formation related to lateral meniscal tearing as describe above MR Lumbar spine (2009) : 1. Dominant finding is advanced facet degenerative change L4-5 with small facet joint effusions and bony edema about the joints. This could be a source of back pain.  2. Negative for notable central canal and foraminal stenosis in patient with a somewhat congenitally narrowed central canal.    . Depression 02/21/2011  . Hyperlipidemia   . Prediabetes   . Plantar fasciitis, bilateral   . History of colonic polyps   . Diverticulosis     Past Surgical History  Procedure Laterality Date  . Total abdominal hysterectomy  w/ bilateral salpingoophorectomy  03/2010    because of symptomatic fibroids, Dr. Mora Bellman  . Knee surgery Right 2007, 2009    Had cyst removed  . Gastric restriction surgery  1980  . Cholecystectomy      There were no vitals taken for this visit.  Visit Diagnosis:    Right knee pain  Bilateral leg and foot pain       Subjective Assessment - 07/14/14 1537    Symptoms Hip pain is better, now has pain more central in her back and right knee. No falls.    Pain Score 5    Pain Location Back  right knee   Pain Orientation Mid;Lower;Right   Pain Descriptors / Indicators Sore   Pain Type Chronic pain   Pain Onset More than a month ago   Pain Frequency Intermittent   Aggravating Factors  walking on hard surfaces, exercises   Pain Relieving Factors rest      Treatment: Exercise on mat - posterior pelvic tilt 5 sec x 10 reps - bridge 5 sec x 10 reps - side lying clam shell red band 5 sec hold x 10 reps - supine hip abdct/ER with red band 5 sec hold x 10 reps - prone heel squeezes 5 sec x 10 reps - prone glut kickbacks x 10 bil - prone hip extension with knee extension 5 sec hold x 10 reps bil - quadruped leg extension/kick outs 10 x each leg with 5 sec hold - quadruped glut kick backs x 10 each leg - quadruped "fire  hydrant" x 10 each leg - supine double knee to chest stretch 20 sec x 3 reps - supine lower trunk rotation left/right 5 sec hold x 10 reps                       PT Short Term Goals - 07/07/14 1324    PT SHORT TERM GOAL #1   Title Demonstrate correct performance of HEP Target 07/09/14   Status Not Met   PT SHORT TERM GOAL #2   Title Pt will increase Berg Balance Test to 53/56 for improved balance.   Status Not Met  51/56   PT SHORT TERM GOAL #3   Title Pt to decrease oswestry pain questionaire to 34% disabiled.    Status Not Met  score decreased to 38% (from 42%) on 07/07/14   PT SHORT TERM GOAL #4   Title Report increased ability  to rest at night when in sidelying due to decreased hip (likely IT band) pain. Target 07/09/14   Status Partially Met  pt reports the pain is decreasing but it is still difficult to lay on the side and rest at night; not as bad as it was at the beginning 07/07/14           PT Long Term Goals - 07/07/14 1603    PT LONG TERM GOAL #1   Title Verbalize understading of fall prevention education [employer] 08/10/14   PT LONG TERM GOAL #2   Title Increase Berg balance test score to 54/56. Target 08/10/14   PT LONG TERM GOAL #3   Title Decrease Oswestry score to 24% for decreased disability due to pain. Target 08/10/14   PT LONG TERM GOAL #4   Title Report increased ease with negotiating stairs for improved home access. [employer] 08/10/14   PT LONG TERM GOAL #5   Title Increase FOTO ABC goal to 35% for increased balance confidence. Target 08/10/14               Plan - 07/14/14 1540    Clinical Impression : Continues to need cues on how to correctly perform exercises. Progressing toward goals.   Rehab Potential Good   PT Frequency 2x / week   PT Duration 8 weeks   PT Treatment/Interventions ADLs/Self Care Home Management;Moist Heat;Manual techniques;Balance training;Gait training;Therapeutic exercise;Therapeutic activities;Neuromuscular re-education;Stair training;Patient/family education  possible orthotics   PT Next Visit Plan Continue to work on lower extremetiy strengthening. ? orthotic order for insert to assist with plantar fascitis pain/hip pain   Consulted and Agree with Plan of Care Patient        Problem List Patient Active Problem List   Diagnosis Date Noted  . Otalgia of right ear 03/19/2014  . Other fatigue 03/05/2014  . Prediabetes 09/09/2012  . Hyperlipidemia   . Depression with anxiety 02/21/2011  . Osteoarthritis 02/21/2011  . Hypothyroidism 05/06/2006  . Essential hypertension 05/06/2006  . KNEE PAIN 05/06/2006    [name] 07/14/2014, 3:40 PM  [name],  PTA, [facility] [address] [place], [place] [address] [phone] 07/14/2014, 3:40 PM

## 2014-07-15 ENCOUNTER — Telehealth: Payer: Self-pay | Admitting: Internal Medicine

## 2014-07-15 NOTE — Telephone Encounter (Signed)
Call to patient to confirm appointment for 07/16/14 at 1:15 lmtcb

## 2014-07-16 ENCOUNTER — Encounter: Payer: Self-pay | Admitting: Internal Medicine

## 2014-07-16 ENCOUNTER — Ambulatory Visit (INDEPENDENT_AMBULATORY_CARE_PROVIDER_SITE_OTHER): Payer: No Typology Code available for payment source | Admitting: Internal Medicine

## 2014-07-16 ENCOUNTER — Ambulatory Visit: Payer: No Typology Code available for payment source | Admitting: Physical Therapy

## 2014-07-16 VITALS — BP 132/59 | HR 83 | Temp 98.6°F | Ht 63.0 in | Wt 261.1 lb

## 2014-07-16 DIAGNOSIS — R279 Unspecified lack of coordination: Secondary | ICD-10-CM

## 2014-07-16 DIAGNOSIS — M79671 Pain in right foot: Secondary | ICD-10-CM

## 2014-07-16 DIAGNOSIS — M79672 Pain in left foot: Secondary | ICD-10-CM

## 2014-07-16 DIAGNOSIS — M79604 Pain in right leg: Secondary | ICD-10-CM

## 2014-07-16 DIAGNOSIS — M25561 Pain in right knee: Secondary | ICD-10-CM

## 2014-07-16 DIAGNOSIS — I10 Essential (primary) hypertension: Secondary | ICD-10-CM

## 2014-07-16 DIAGNOSIS — M79605 Pain in left leg: Secondary | ICD-10-CM

## 2014-07-16 NOTE — Patient Instructions (Signed)
General Instructions:   Please try to bring all your medicines next time. This will help [place] keep you safe from mistakes.   Progress Toward Treatment Goals:  Treatment Goal 09/09/2012  Blood pressure at goal    Self Care Goals & Plans:  Self Care Goal 07/16/2014  Manage my medications take my medicines as prescribed; bring my medications to every visit; refill my medications on time  Monitor my health -  Eat healthy foods drink diet soda or water instead of juice or soda; eat more vegetables; eat foods that are low in salt; eat baked foods instead of fried foods; eat fruit for snacks and desserts  Be physically active -    No flowsheet data found.   Care Management & Community Referrals:  No flowsheet data found.

## 2014-07-16 NOTE — Progress Notes (Signed)
Subjective:   Patient ID: Joanna Gilbert female   DOB: 27-Oct-1950 64 y.o.   MRN: [id number]  HPI: [name] is a 64 y.o. woman pmh as listed below presents for HTN recheck.   Hypertension ROS: taking medications as instructed, no medication side effects noted, no TIA's, no chest pain on exertion, no dyspnea on exertion and no swelling of ankles.   New concerns: she is doing well with PT and rehab for her plantar fasciitis.   Past Medical History  Diagnosis Date  . Hypertension   . Hypothyroidism   . Fibroid uterus 05/06/2006    Nov 2011: total abdominal hysterectomy with b/l salpingoopherectomy by Dr. Mora Bellman for symptomatic fibroids.  Qualifier: Diagnosis of  By: [name] MD, [name]    . Osteoarthritis 02/21/2011    MR b/l Hips (2009): Mild to moderate bilateral hip osteoarthritis, more notable on the right, with likely associated small area of anterior acetabular labral tearing.  MR R knee (2009): Dominant finding is marked interval progression in lateral compartment degenerative disease with new tearing in the posterior horn of the lateral meniscus. Persistent perimeniscal cyst formation related to lateral meniscal tearing as describe above MR Lumbar spine (2009) : 1. Dominant finding is advanced facet degenerative change L4-5 with small facet joint effusions and bony edema about the joints. This could be a source of back pain.  2. Negative for notable central canal and foraminal stenosis in patient with a somewhat congenitally narrowed central canal.    . Depression 02/21/2011  . Hyperlipidemia   . Prediabetes   . Plantar fasciitis, bilateral   . History of colonic polyps   . Diverticulosis    Current Outpatient Prescriptions  Medication Sig Dispense Refill  . hydrochlorothiazide (HYDRODIURIL) 25 MG tablet Take 1 tablet (25 mg total) by mouth daily. 30 tablet 12  . ibuprofen (ADVIL,MOTRIN) 400 MG tablet Take 1-2 tablets as need for pain at bedtime 90 tablet 3  .  levothyroxine (SYNTHROID, LEVOTHROID) 100 MCG tablet Take 1 tablet (100 mcg total) by mouth daily before breakfast. 30 tablet 1  . levothyroxine (SYNTHROID, LEVOTHROID) 100 MCG tablet TAKE ONE TABLET BY MOUTH ONCE DAILY BEFORE  BREAKFAST 30 tablet 0  . loratadine (CLARITIN) 10 MG tablet Take 10 mg by mouth daily as needed for allergies.     No current facility-administered medications for this visit.   Family History  Problem Relation Age of Onset  . Alcohol abuse Mother   . Heart disease Mother 48    required CABG  . Hypothyroidism Father   . Arthritis Sister   . Hypertension Sister    History   Social History  . Marital Status: Married    Spouse Name: N/A  . Number of Children: 2  . Years of Education: college   Occupational History  . unemployed     used to work as a Teacher, early years/pre   Social History Main Topics  . Smoking status: Never Smoker   . Smokeless tobacco: Never Used  . Alcohol Use: 0.0 oz/week    0 Standard drinks or equivalent per week  . Drug Use: No  . Sexual Activity: No   Other Topics Concern  . None   Social History Narrative   From [place], has lived in [place] since 1993 and lives with her husband. Can read and write fluently in [place].   Review of Systems: Pertinent items are noted in HPI. Objective:  Physical Exam: Filed Vitals:   07/16/14 1326  BP: 132/59  Pulse: 83  Temp: 98.6 F (37 C)  TempSrc: Oral  Height: 5\' 3"  (1.6 m)  Weight: 261 lb 1.6 oz (118.434 kg)  SpO2: 100%   General: sitting in chair, NAD  HEENT: PERRL, EOMI, no scleral icterus Cardiac: RRR, no rubs, murmurs or gallops Pulm: clear to auscultation bilaterally, moving normal volumes of air Abd: soft, nontender, nondistended, BS present Ext: warm and well perfused, no pedal edema Neuro: alert and oriented X3, cranial nerves II-XII grossly intact  Assessment & Plan:  Please see problem oriented charting  Pt discussed with Dr. Dareen Piano

## 2014-07-16 NOTE — Therapy (Signed)
[facility] [address] [place], [place], [address] Phone: [phone]   Fax:  [phone]  Physical Therapy Treatment  Patient Details  Name: Joanna Gilbert MRN: [id number] Date of Birth: [birth year] Referring Provider:  Clinton Gallant, MD  Encounter Date: 07/16/2014      PT End of Session - 07/16/14 1107    Visit Number 10   Number of Visits 17   Date for PT Re-Evaluation 08/10/14   Authorization Type GCCN expires 09/08/14   PT Start Time 1102   PT Stop Time 1140   PT Time Calculation (min) 38 min   Equipment Utilized During Treatment Gait belt   Activity Tolerance Patient tolerated treatment well   Behavior During Therapy [facility] for tasks assessed/performed      Past Medical History  Diagnosis Date  . Hypertension   . Hypothyroidism   . Fibroid uterus 05/06/2006    Nov 2011: total abdominal hysterectomy with b/l salpingoopherectomy by Dr. Mora Bellman for symptomatic fibroids.  Qualifier: Diagnosis of  By: [name] MD, [name]    . Osteoarthritis 02/21/2011    MR b/l Hips (2009): Mild to moderate bilateral hip osteoarthritis, more notable on the right, with likely associated small area of anterior acetabular labral tearing.  MR R knee (2009): Dominant finding is marked interval progression in lateral compartment degenerative disease with new tearing in the posterior horn of the lateral meniscus. Persistent perimeniscal cyst formation related to lateral meniscal tearing as describe above MR Lumbar spine (2009) : 1. Dominant finding is advanced facet degenerative change L4-5 with small facet joint effusions and bony edema about the joints. This could be a source of back pain.  2. Negative for notable central canal and foraminal stenosis in patient with a somewhat congenitally narrowed central canal.    . Depression 02/21/2011  . Hyperlipidemia   . Prediabetes   . Plantar fasciitis, bilateral   . History of colonic polyps   .  Diverticulosis     Past Surgical History  Procedure Laterality Date  . Total abdominal hysterectomy w/ bilateral salpingoophorectomy  03/2010    because of symptomatic fibroids, Dr. Mora Bellman  . Knee surgery Right 2007, 2009    Had cyst removed  . Gastric restriction surgery  1980  . Cholecystectomy      There were no vitals taken for this visit.  Visit Diagnosis:  Lack of coordination  Right knee pain  Bilateral leg and foot pain      Subjective Assessment - 07/16/14 1106    Symptoms No falls. No hip pain, does have right knee pain today.    Currently in Pain? Yes   Pain Score 3    Pain Orientation Right   Pain Descriptors / Indicators Sore   Pain Type Chronic pain   Pain Onset More than a month ago   Pain Frequency Intermittent   Aggravating Factors  walking on hard surfaces, exercises   Pain Relieving Factors rest      Neuro Re-ed - at counter: high knee marches, toe walk, heel walk and tandem walk, all forward/backward, x 3 laps each way with intermittent UE support needed for balance  - on folded blue mat with tall cones: single leg stance activities: alternating forward taps, cross taps, forward double taps, cross double taps and flip over/up x 10 each bil legs with up to min assist for balance.  Exercise - walking forward lunges 40 feet x 2 laps - in squat position side stepping left/right  along 40 foot path x 1 lap each - in squat position with wide base of support forward walk "cowboy walk" 40 feet x 2 laps - in squat position diagonal forward stepping "duck walk" 40 feet x 2 laps - stool scoots forward/backward 40 feet x 2 laps each way        PT Short Term Goals - 07/07/14 1324    PT SHORT TERM GOAL #1   Title Demonstrate correct performance of HEP [employer] 07/09/14   Status Not Met   PT SHORT TERM GOAL #2   Title Pt will increase Berg Balance Test to 53/56 for improved balance.   Status Not Met  51/56   PT SHORT TERM GOAL #3   Title Pt to  decrease oswestry pain questionaire to 34% disabiled.    Status Not Met  score decreased to 38% (from 42%) on 07/07/14   PT SHORT TERM GOAL #4   Title Report increased ability to rest at night when in sidelying due to decreased hip (likely IT band) pain. Target 07/09/14   Status Partially Met  pt reports the pain is decreasing but it is still difficult to lay on the side and rest at night; not as bad as it was at the beginning 07/07/14           PT Long Term Goals - 07/07/14 1603    PT LONG TERM GOAL #1   Title Verbalize understading of fall prevention education [employer] 08/10/14   PT LONG TERM GOAL #2   Title Increase Berg balance test score to 54/56. Target 08/10/14   PT LONG TERM GOAL #3   Title Decrease Oswestry score to 24% for decreased disability due to pain. Target 08/10/14   PT LONG TERM GOAL #4   Title Report increased ease with negotiating stairs for improved home access. [employer] 08/10/14   PT LONG TERM GOAL #5   Title Increase FOTO ABC goal to 35% for increased balance confidence. Target 08/10/14           Plan - 07/16/14 1107    Clinical Impression Statement Focused more on balance activities today without any issues reported by patient. Progressing toward goals. Order for orthotic sent to MD today. Once orthotic order recieved will begin discharge process per PT plan of care (discussed with Theressa Millard, PT, DPT today).     Rehab Potential Good   PT Frequency 2x / week   PT Duration 8 weeks   PT Treatment/Interventions ADLs/Self Care Home Management;Moist Heat;Manual techniques;Balance training;Gait training;Therapeutic exercise;Therapeutic activities;Neuromuscular re-education;Stair training;Patient/family education  possible orthotics   PT Next Visit Plan Continue to work on lower extremetiy strengthening. ? orthotic order for insert to assist with plantar fascitis pain/hip pain   Consulted and Agree with Plan of Care Patient        Problem List Patient Active  Problem List   Diagnosis Date Noted  . Otalgia of right ear 03/19/2014  . Other fatigue 03/05/2014  . Prediabetes 09/09/2012  . Hyperlipidemia   . Depression with anxiety 02/21/2011  . Osteoarthritis 02/21/2011  . Hypothyroidism 05/06/2006  . Essential hypertension 05/06/2006  . KNEE PAIN 05/06/2006    [name] 07/16/2014, 7:16 PM  [name], PTA, [facility] [address] [place], [place] [address] [phone] 07/16/2014, 7:16 PM

## 2014-07-16 NOTE — Assessment & Plan Note (Signed)
BP Readings from Last 3 Encounters:  07/16/14 132/59  04/30/14 155/64  03/19/14 153/71    Lab Results  Component Value Date   NA 141 04/30/2014   K 4.1 04/30/2014   CREATININE 0.71 04/30/2014    Assessment: Blood pressure control:   Progress toward BP goal:    Comments: well controlled at goal  Plan: Medications:  continue current medications of HCTZ 25mg   Educational resources provided: brochure (denies) Self management tools provided:   Other plans: f/u in 3 months

## 2014-07-19 NOTE — Progress Notes (Signed)
INTERNAL MEDICINE TEACHING ATTENDING ADDENDUM - Bartolo Montanye, MD: I reviewed and discussed at the time of visit with the resident Dr. Sadek, the patient's medical history, physical examination, diagnosis and results of pertinent tests and treatment and I agree with the patient's care as documented.  

## 2014-07-20 ENCOUNTER — Ambulatory Visit: Payer: No Typology Code available for payment source | Admitting: Physical Therapy

## 2014-07-20 ENCOUNTER — Encounter: Payer: Self-pay | Admitting: Physical Therapy

## 2014-07-20 DIAGNOSIS — R279 Unspecified lack of coordination: Secondary | ICD-10-CM

## 2014-07-20 DIAGNOSIS — M79604 Pain in right leg: Secondary | ICD-10-CM

## 2014-07-20 DIAGNOSIS — M25561 Pain in right knee: Secondary | ICD-10-CM

## 2014-07-20 DIAGNOSIS — M79671 Pain in right foot: Secondary | ICD-10-CM

## 2014-07-20 DIAGNOSIS — M79605 Pain in left leg: Secondary | ICD-10-CM

## 2014-07-20 DIAGNOSIS — M79672 Pain in left foot: Secondary | ICD-10-CM

## 2014-07-20 NOTE — Therapy (Signed)
[facility] [address] [place], [place], [address] Phone: [phone]   Fax:  [phone]  Physical Therapy Treatment  Patient Details  Name: Joanna Gilbert MRN: [id number] Date of Birth: [birth year] Referring Provider:  Clinton Gallant, MD  Encounter Date: 07/20/2014      PT End of Session - 07/20/14 0807    Visit Number 11   Number of Visits 17   Date for PT Re-Evaluation 08/10/14   Authorization Type GCCN expires 09/08/14   PT Start Time 0805   PT Stop Time 0844   PT Time Calculation (min) 39 min   Equipment Utilized During Treatment Gait belt   Activity Tolerance Patient tolerated treatment well   Behavior During Therapy [facility] for tasks assessed/performed      Past Medical History  Diagnosis Date  . Hypertension   . Hypothyroidism   . Fibroid uterus 05/06/2006    Nov 2011: total abdominal hysterectomy with b/l salpingoopherectomy by Dr. Mora Bellman for symptomatic fibroids.  Qualifier: Diagnosis of  By: [name] MD, [name]    . Osteoarthritis 02/21/2011    MR b/l Hips (2009): Mild to moderate bilateral hip osteoarthritis, more notable on the right, with likely associated small area of anterior acetabular labral tearing.  MR R knee (2009): Dominant finding is marked interval progression in lateral compartment degenerative disease with new tearing in the posterior horn of the lateral meniscus. Persistent perimeniscal cyst formation related to lateral meniscal tearing as describe above MR Lumbar spine (2009) : 1. Dominant finding is advanced facet degenerative change L4-5 with small facet joint effusions and bony edema about the joints. This could be a source of back pain.  2. Negative for notable central canal and foraminal stenosis in patient with a somewhat congenitally narrowed central canal.    . Depression 02/21/2011  . Hyperlipidemia   . Prediabetes   . Plantar fasciitis, bilateral   . History of colonic polyps   .  Diverticulosis     Past Surgical History  Procedure Laterality Date  . Total abdominal hysterectomy w/ bilateral salpingoophorectomy  03/2010    because of symptomatic fibroids, Dr. Mora Bellman  . Knee surgery Right 2007, 2009    Had cyst removed  . Gastric restriction surgery  1980  . Cholecystectomy      There were no vitals taken for this visit.  Visit Diagnosis:  Right knee pain  Bilateral leg and foot pain  Lack of coordination      Subjective Assessment - 07/20/14 0806    Symptoms No falls. Hurting this am "morning's are worse".   Currently in Pain? Yes   Pain Score 4    Pain Location Other (Comment)  generalized   Pain Orientation Right;Left   Pain Descriptors / Indicators Aching;Sore   Pain Type Chronic pain   Pain Onset More than a month ago   Pain Frequency Constant   Aggravating Factors  walking on hard surfaces, exercises   Pain Relieving Factors stretching     Treatment: Exercise - seated plantar fascia stretch via soft bowling pin rolled under foot 3 sets of 10 rolls each foot - calf stretch off bottom step with bil UE support 20 sec hold x 3 reps - Nustep x 4 extremities level 4.0 x 8 minutes with goal >/= 60 steps per minutes for strengthening and activity tolerance    Neuro Re-ed On red/blue mats - high knee marches, tandem walk, toe walk, heel walk. All forward/backward x 3 laps each  with up to min assist for balance  On blue mat with tall cones - alternating forward toe taps, cross toe taps, forward double toe taps, cross double toe taps and flip over/up x 10 reps each bil sides with up to min assist for balance and cues on posture and weight shifting.         PT Short Term Goals - 07/07/14 1324    PT SHORT TERM GOAL #1   Title Demonstrate correct performance of HEP [employer] 07/09/14   Status Not Met   PT SHORT TERM GOAL #2   Title Pt will increase Berg Balance Test to 53/56 for improved balance.   Status Not Met  51/56   PT SHORT  TERM GOAL #3   Title Pt to decrease oswestry pain questionaire to 34% disabiled.    Status Not Met  score decreased to 38% (from 42%) on 07/07/14   PT SHORT TERM GOAL #4   Title Report increased ability to rest at night when in sidelying due to decreased hip (likely IT band) pain. [employer] 07/09/14   Status Partially Met  pt reports the pain is decreasing but it is still difficult to lay on the side and rest at night; not as bad as it was at the beginning 07/07/14           PT Long Term Goals - 07/07/14 1603    PT LONG TERM GOAL #1   Title Verbalize understading of fall prevention education [employer] 08/10/14   PT LONG TERM GOAL #2   Title Increase Berg balance test score to 54/56. [employer] 08/10/14   PT LONG TERM GOAL #3   Title Decrease Oswestry score to 24% for decreased disability due to pain. [employer] 08/10/14   PT LONG TERM GOAL #4   Title Report increased ease with negotiating stairs for improved home access. [employer] 08/10/14   PT LONG TERM GOAL #5   Title Increase FOTO ABC goal to 35% for increased balance confidence. Target 08/10/14          Plan - 07/20/14 0813    Clinical Impression Statement Continued to work on balance and strengthening today without any issues reported by patient. Awaiting order from Md for orthotic consult. Pt given information today on [employer] to go there and learn what style shoe would be best for her feet. Pt plans to go there a some point this week.                                       Rehab Potential Good   PT Frequency 2x / week   PT Duration 8 weeks   PT Treatment/Interventions ADLs/Self Care Home Management;Moist Heat;Manual techniques;Balance training;Gait training;Therapeutic exercise;Therapeutic activities;Neuromuscular re-education;Stair training;Patient/family education  possible orthotics   PT Next Visit Plan Continue to work on lower extremetiy strengthening and balance   Consulted and Agree with Plan of Care Patient        Problem  List Patient Active Problem List   Diagnosis Date Noted  . Otalgia of right ear 03/19/2014  . Other fatigue 03/05/2014  . Prediabetes 09/09/2012  . Hyperlipidemia   . Depression with anxiety 02/21/2011  . Osteoarthritis 02/21/2011  . Hypothyroidism 05/06/2006  . Essential hypertension 05/06/2006  . KNEE PAIN 05/06/2006    [name] 07/20/2014, 8:45 AM  [name], PTA, CLT [facility] [address], [place], [place] [address]  [phone] 07/20/2014, 8:45 AM

## 2014-07-22 ENCOUNTER — Ambulatory Visit: Payer: No Typology Code available for payment source | Admitting: Physical Therapy

## 2014-07-22 ENCOUNTER — Encounter: Payer: Self-pay | Admitting: Physical Therapy

## 2014-07-22 DIAGNOSIS — R279 Unspecified lack of coordination: Secondary | ICD-10-CM

## 2014-07-22 DIAGNOSIS — M79605 Pain in left leg: Secondary | ICD-10-CM

## 2014-07-22 DIAGNOSIS — M79604 Pain in right leg: Secondary | ICD-10-CM

## 2014-07-22 DIAGNOSIS — M25561 Pain in right knee: Secondary | ICD-10-CM

## 2014-07-22 DIAGNOSIS — M79672 Pain in left foot: Secondary | ICD-10-CM

## 2014-07-22 DIAGNOSIS — M79671 Pain in right foot: Secondary | ICD-10-CM

## 2014-07-22 NOTE — Therapy (Signed)
[facility] [address] [place], [place], [address] Phone: [phone]   Fax:  [phone]  Physical Therapy Treatment  Patient Details  Name: Joanna Gilbert MRN: [id number] Date of Birth: [birth year] Referring Provider:  Clinton Gallant, MD  Encounter Date: 07/22/2014      PT End of Session - 07/22/14 1107    Visit Number 12   Number of Visits 17   Date for PT Re-Evaluation 08/10/14   Authorization Type GCCN expires 09/08/14   PT Start Time 1102   PT Stop Time 1140   PT Time Calculation (min) 38 min   Equipment Utilized During Treatment Gait belt   Activity Tolerance Patient tolerated treatment well   Behavior During Therapy [facility] for tasks assessed/performed      Past Medical History  Diagnosis Date  . Hypertension   . Hypothyroidism   . Fibroid uterus 05/06/2006    Nov 2011: total abdominal hysterectomy with b/l salpingoopherectomy by Dr. Mora Bellman for symptomatic fibroids.  Qualifier: Diagnosis of  By: [name] MD, [name]    . Osteoarthritis 02/21/2011    MR b/l Hips (2009): Mild to moderate bilateral hip osteoarthritis, more notable on the right, with likely associated small area of anterior acetabular labral tearing.  MR R knee (2009): Dominant finding is marked interval progression in lateral compartment degenerative disease with new tearing in the posterior horn of the lateral meniscus. Persistent perimeniscal cyst formation related to lateral meniscal tearing as describe above MR Lumbar spine (2009) : 1. Dominant finding is advanced facet degenerative change L4-5 with small facet joint effusions and bony edema about the joints. This could be a source of back pain.  2. Negative for notable central canal and foraminal stenosis in patient with a somewhat congenitally narrowed central canal.    . Depression 02/21/2011  . Hyperlipidemia   . Prediabetes   . Plantar fasciitis, bilateral   . History of colonic polyps   .  Diverticulosis     Past Surgical History  Procedure Laterality Date  . Total abdominal hysterectomy w/ bilateral salpingoophorectomy  03/2010    because of symptomatic fibroids, Dr. Mora Bellman  . Knee surgery Right 2007, 2009    Had cyst removed  . Gastric restriction surgery  1980  . Cholecystectomy      There were no vitals taken for this visit.  Visit Diagnosis:  Right knee pain  Bilateral leg and foot pain  Lack of coordination      Subjective Assessment - 07/22/14 1105    Symptoms No new compliants. No falls.    Pain Score 3    Pain Location Knee  feet   Pain Orientation Right   Pain Descriptors / Indicators Sore;Aching   Pain Type Chronic pain   Pain Onset More than a month ago   Pain Frequency Constant   Aggravating Factors  wallking on hard surfaces, exercises   Pain Relieving Factors stretching     Treatment Exercise - seated plantar fascia stretch with foam bowling pin rolled slowly under foot x 15 each side. Cues to slow down roll and place more pressure on pin for better results. - calf stretch off bottom step with bil UE support 20 sec hold x 3 reps - Scifit x 4 extremities level 4.0 x 8 minutes with goal >/= 50 rpm for strengthening and activity tolerance.  Neuro Re-ed: in parallel bars - on inverted BOSU: anterior/posterior rocks, lateral rocks and mini squats x 10 each with minimal UE  support. - standing with feet across blue foam beam: alternating forward heel taps and backward toe taps x 10 each bil sides with minimal UE support. - on blue foam beam: side stepping left/right and tandem gait forward/backward x 3 laps each/each way with intermittent need of UE support. - single leg stance activity: ball roll forward/backwards, laterally and circles both ways x 10 each bil sides with intermittent UE assist needed and cues on posture, level pelvis and technique.        PT Short Term Goals - 07/07/14 1324    PT SHORT TERM GOAL #1   Title  Demonstrate correct performance of HEP [employer] 07/09/14   Status Not Met   PT SHORT TERM GOAL #2   Title Pt will increase Berg Balance Test to 53/56 for improved balance.   Status Not Met  51/56   PT SHORT TERM GOAL #3   Title Pt to decrease oswestry pain questionaire to 34% disabiled.    Status Not Met  score decreased to 38% (from 42%) on 07/07/14   PT SHORT TERM GOAL #4   Title Report increased ability to rest at night when in sidelying due to decreased hip (likely IT band) pain. [employer] 07/09/14   Status Partially Met  pt reports the pain is decreasing but it is still difficult to lay on the side and rest at night; not as bad as it was at the beginning 07/07/14           PT Long Term Goals - 07/07/14 1603    PT LONG TERM GOAL #1   Title Verbalize understading of fall prevention education [employer] 08/10/14   PT LONG TERM GOAL #2   Title Increase Berg balance test score to 54/56. [employer] 08/10/14   PT LONG TERM GOAL #3   Title Decrease Oswestry score to 24% for decreased disability due to pain. [employer] 08/10/14   PT LONG TERM GOAL #4   Title Report increased ease with negotiating stairs for improved home access. [employer] 08/10/14   PT LONG TERM GOAL #5   Title Increase FOTO ABC goal to 35% for increased balance confidence. Target 08/10/14          Plan - 07/22/14 1107    Clinical Impression Statement Pt making progress toward her goals. Pt given copy of the MD order for her orthotics and numbers for 2 local orthotic companies. Pt to call them and set up appt with one of them.   Rehab Potential Good   PT Frequency 2x / week   PT Duration 8 weeks   PT Treatment/Interventions ADLs/Self Care Home Management;Moist Heat;Manual techniques;Balance training;Gait training;Therapeutic exercise;Therapeutic activities;Neuromuscular re-education;Stair training;Patient/family education  possible orthotics   PT Next Visit Plan Continue to work on lower extremetiy strengthening and balance. Begin checking  LTG's next week.   Consulted and Agree with Plan of Care Patient      Problem List Patient Active Problem List   Diagnosis Date Noted  . Otalgia of right ear 03/19/2014  . Other fatigue 03/05/2014  . Prediabetes 09/09/2012  . Hyperlipidemia   . Depression with anxiety 02/21/2011  . Osteoarthritis 02/21/2011  . Hypothyroidism 05/06/2006  . Essential hypertension 05/06/2006  . KNEE PAIN 05/06/2006    [name] 07/22/2014, 1:00 PM  [name], PTA, [facility] [address] [place], [place] [address] [phone] 07/22/2014, 1:00 PM

## 2014-07-28 ENCOUNTER — Other Ambulatory Visit: Payer: Self-pay | Admitting: Internal Medicine

## 2014-07-28 ENCOUNTER — Ambulatory Visit: Payer: No Typology Code available for payment source | Attending: Internal Medicine | Admitting: Physical Therapy

## 2014-07-28 ENCOUNTER — Encounter: Payer: Self-pay | Admitting: Physical Therapy

## 2014-07-28 DIAGNOSIS — M79604 Pain in right leg: Secondary | ICD-10-CM | POA: Insufficient documentation

## 2014-07-28 DIAGNOSIS — M25561 Pain in right knee: Secondary | ICD-10-CM

## 2014-07-28 DIAGNOSIS — R279 Unspecified lack of coordination: Secondary | ICD-10-CM | POA: Insufficient documentation

## 2014-07-28 DIAGNOSIS — M79605 Pain in left leg: Secondary | ICD-10-CM | POA: Insufficient documentation

## 2014-07-28 DIAGNOSIS — R262 Difficulty in walking, not elsewhere classified: Secondary | ICD-10-CM | POA: Insufficient documentation

## 2014-07-28 NOTE — Therapy (Signed)
[facility] [address] [place], [place], [address] Phone: [phone]   Fax:  [phone]  Physical Therapy Treatment  Patient Details  Name: Joanna Gilbert MRN: [id number] Date of Birth: 10/30/1950 Referring Provider:  Clinton Gallant, MD  Encounter Date: 07/28/2014      PT End of Session - 07/28/14 1324    Visit Number 13   Number of Visits 17   Date for PT Re-Evaluation 08/10/14   Authorization Type GCCN expires 09/08/14   PT Start Time 1315   PT Stop Time 1359   PT Time Calculation (min) 44 min   Equipment Utilized During Treatment Gait belt   Activity Tolerance Patient tolerated treatment well   Behavior During Therapy [facility] for tasks assessed/performed      Past Medical History  Diagnosis Date  . Hypertension   . Hypothyroidism   . Fibroid uterus 05/06/2006    Nov 2011: total abdominal hysterectomy with b/l salpingoopherectomy by Dr. Mora Bellman for symptomatic fibroids.  Qualifier: Diagnosis of  By: [name] MD, [name]    . Osteoarthritis 02/21/2011    MR b/l Hips (2009): Mild to moderate bilateral hip osteoarthritis, more notable on the right, with likely associated small area of anterior acetabular labral tearing.  MR R knee (2009): Dominant finding is marked interval progression in lateral compartment degenerative disease with new tearing in the posterior horn of the lateral meniscus. Persistent perimeniscal cyst formation related to lateral meniscal tearing as describe above MR Lumbar spine (2009) : 1. Dominant finding is advanced facet degenerative change L4-5 with small facet joint effusions and bony edema about the joints. This could be a source of back pain.  2. Negative for notable central canal and foraminal stenosis in patient with a somewhat congenitally narrowed central canal.    . Depression 02/21/2011  . Hyperlipidemia   . Prediabetes   . Plantar fasciitis, bilateral   . History of colonic polyps   .  Diverticulosis     Past Surgical History  Procedure Laterality Date  . Total abdominal hysterectomy w/ bilateral salpingoophorectomy  03/2010    because of symptomatic fibroids, Dr. Mora Bellman  . Knee surgery Right 2007, 2009    Had cyst removed  . Gastric restriction surgery  1980  . Cholecystectomy      There were no vitals taken for this visit.  Visit Diagnosis:  Right knee pain  Lack of coordination      Subjective Assessment - 07/28/14 1320    Symptoms No new complaints. No falls. Did follow up with [employer] and received shoe recommendations. Has not followed up on orthotics as yet, waiting to get new shoes first.   Currently in Pain? Yes   Pain Score 3    Pain Location Knee   Pain Orientation Right;Left  right > left   Pain Descriptors / Indicators Aching   Pain Type Chronic pain   Pain Onset More than a month ago   Pain Frequency Constant   Aggravating Factors  walking on hard surfaces, exercises   Pain Relieving Factors stretching     Treatment: Exercise - plantar fascia stretching with foam bowling pin x 10-13 each side. -  Heel cord stretch of edge of bottom step 30 sec hold x 3 reps - runner stretch at wall 20 sec's x 3 each side. - Scifit x 4 extremities level 4.0 x 8 minutes with goal rpm >/= 50 for strengthening and activity tolerance.   Neuro Re-ed: On red  mats - high knee marches, toe walk, heel walk and tandem walk forward/backwards x 3 laps each/each way with up to min assist for balance and cues on posture. - standing with feet across blue foam beam: alternating forward heel taps and backwards toe taps x 10 each bil sides with up to min assist for balance - blue beam next to counter: forward/backward tandem walk with toe taps to floor with each step x 2 laps, and heel taps to floor with each step x 2 laps.  - inverted BOSU: rocking anterior/posteior and side/side x 10 times each way with limited UE assist and up to min assist for  balance.         PT Short Term Goals - 07/07/14 1324    PT SHORT TERM GOAL #1   Title Demonstrate correct performance of HEP [employer] 07/09/14   Status Not Met   PT SHORT TERM GOAL #2   Title Pt will increase Berg Balance Test to 53/56 for improved balance.   Status Not Met  51/56   PT SHORT TERM GOAL #3   Title Pt to decrease oswestry pain questionaire to 34% disabiled.    Status Not Met  score decreased to 38% (from 42%) on 07/07/14   PT SHORT TERM GOAL #4   Title Report increased ability to rest at night when in sidelying due to decreased hip (likely IT band) pain. Target 07/09/14   Status Partially Met  pt reports the pain is decreasing but it is still difficult to lay on the side and rest at night; not as bad as it was at the beginning 07/07/14           PT Long Term Goals - 07/07/14 1603    PT LONG TERM GOAL #1   Title Verbalize understading of fall prevention education [employer] 08/10/14   PT LONG TERM GOAL #2   Title Increase Berg balance test score to 54/56. [employer] 08/10/14   PT LONG TERM GOAL #3   Title Decrease Oswestry score to 24% for decreased disability due to pain. [employer] 08/10/14   PT LONG TERM GOAL #4   Title Report increased ease with negotiating stairs for improved home access. [employer] 08/10/14   PT LONG TERM GOAL #5   Title Increase FOTO ABC goal to 35% for increased balance confidence. Target 08/10/14           Plan - 07/28/14 1325    Clinical Impression Statement Pt continues to be challenged by balance activites on complaint surfaces. Progressing toward goals.    Rehab Potential Good   PT Frequency 2x / week   PT Duration 8 weeks   PT Treatment/Interventions ADLs/Self Care Home Management;Moist Heat;Manual techniques;Balance training;Gait training;Therapeutic exercise;Therapeutic activities;Neuromuscular re-education;Stair training;Patient/family education  possible orthotics   PT Next Visit Plan Continue to work on lower extremetiy strengthening and  balance. Begin checking LTG's next week.   Consulted and Agree with Plan of Care Patient        Problem List Patient Active Problem List   Diagnosis Date Noted  . Otalgia of right ear 03/19/2014  . Other fatigue 03/05/2014  . Prediabetes 09/09/2012  . Hyperlipidemia   . Depression with anxiety 02/21/2011  . Osteoarthritis 02/21/2011  . Hypothyroidism 05/06/2006  . Essential hypertension 05/06/2006  . KNEE PAIN 05/06/2006    [name] 07/28/2014, 2:00 PM  [name], PTA, [facility] [address] [place], [place] [address] [phone] 07/28/2014, 2:00 PM

## 2014-07-30 ENCOUNTER — Ambulatory Visit: Payer: No Typology Code available for payment source | Admitting: Physical Therapy

## 2014-07-30 ENCOUNTER — Encounter: Payer: Self-pay | Admitting: Physical Therapy

## 2014-07-30 DIAGNOSIS — M79605 Pain in left leg: Secondary | ICD-10-CM

## 2014-07-30 DIAGNOSIS — M25561 Pain in right knee: Secondary | ICD-10-CM

## 2014-07-30 DIAGNOSIS — M79604 Pain in right leg: Secondary | ICD-10-CM

## 2014-07-30 DIAGNOSIS — M79671 Pain in right foot: Secondary | ICD-10-CM

## 2014-07-30 DIAGNOSIS — R279 Unspecified lack of coordination: Secondary | ICD-10-CM

## 2014-07-30 DIAGNOSIS — M79672 Pain in left foot: Secondary | ICD-10-CM

## 2014-07-30 NOTE — Therapy (Signed)
[facility] [address] [place], [place], [address] Phone: [phone]   Fax:  [phone]  Physical Therapy Treatment  Patient Details  Name: Joanna Gilbert MRN: [id number] Date of Birth: 1950-09-16 Referring Provider:  Clinton Gallant, MD  Encounter Date: 07/30/2014      PT End of Session - 07/30/14 1237    Visit Number 14   Number of Visits 17   Date for PT Re-Evaluation 08/10/14   Authorization Type GCCN expires 09/08/14   PT Start Time 1231   PT Stop Time 1313   PT Time Calculation (min) 42 min   Equipment Utilized During Treatment Gait belt   Activity Tolerance Patient tolerated treatment well   Behavior During Therapy [facility] for tasks assessed/performed      Past Medical History  Diagnosis Date  . Hypertension   . Hypothyroidism   . Fibroid uterus 05/06/2006    Nov 2011: total abdominal hysterectomy with b/l salpingoopherectomy by Dr. Mora Bellman for symptomatic fibroids.  Qualifier: Diagnosis of  By: [name] MD, [name]    . Osteoarthritis 02/21/2011    MR b/l Hips (2009): Mild to moderate bilateral hip osteoarthritis, more notable on the right, with likely associated small area of anterior acetabular labral tearing.  MR R knee (2009): Dominant finding is marked interval progression in lateral compartment degenerative disease with new tearing in the posterior horn of the lateral meniscus. Persistent perimeniscal cyst formation related to lateral meniscal tearing as describe above MR Lumbar spine (2009) : 1. Dominant finding is advanced facet degenerative change L4-5 with small facet joint effusions and bony edema about the joints. This could be a source of back pain.  2. Negative for notable central canal and foraminal stenosis in patient with a somewhat congenitally narrowed central canal.    . Depression 02/21/2011  . Hyperlipidemia   . Prediabetes   . Plantar fasciitis, bilateral   . History of colonic polyps   .  Diverticulosis     Past Surgical History  Procedure Laterality Date  . Total abdominal hysterectomy w/ bilateral salpingoophorectomy  03/2010    because of symptomatic fibroids, Dr. Mora Bellman  . Knee surgery Right 2007, 2009    Had cyst removed  . Gastric restriction surgery  1980  . Cholecystectomy      There were no vitals taken for this visit.  Visit Diagnosis:  Right knee pain  Lack of coordination  Bilateral leg and foot pain      Subjective Assessment - 07/30/14 1236    Symptoms No new complaints. Reports her knee feels better today.   Currently in Pain? Yes   Pain Score 5    Pain Location Knee   Pain Orientation Right   Pain Descriptors / Indicators Sore   Pain Type Chronic pain   Pain Onset More than a month ago   Pain Frequency Constant   Aggravating Factors  walking on hard surfaces   Pain Relieving Factors stretching      Exercise: -Plantar fascia stretch with foam bowling pin 10-13x each side -Heel Cord stretch on edge of step 3 x 30 seconds -Runner stretch at wall 3 x 20 seconds each leg -Scifit x 4 extremities level 4.0 x 8 minutes with focus on lower extremities -Hip stability Red theraband loop around ankles.  Cowboy, Diagonals, Side stepping 2 x 20 feet fwd/retro/lateral   Neuro re-ed: -Blue foam beam standing with feet across:  Alternating fwd heel taps and bwd toe taps 3 x  ea bil sides with up to min assist for balance next to counter -Inverted BOSU:  Rocking anterior/posterior and side/side 10 x each way min guard -Inverted BOSU:  Head movements for side to side, up/down and diagonals 10 x each way min guard -Single leg stance with eyes open 5 x 10 seconds on each requiring min assistance for balance control        PT Short Term Goals - 07/07/14 1324    PT SHORT TERM GOAL #1   Title Demonstrate correct performance of HEP [employer] 07/09/14   Status Not Met   PT SHORT TERM GOAL #2   Title Pt will increase Berg Balance Test to 53/56 for  improved balance.   Status Not Met  51/56   PT SHORT TERM GOAL #3   Title Pt to decrease oswestry pain questionaire to 34% disabiled.    Status Not Met  score decreased to 38% (from 42%) on 07/07/14   PT SHORT TERM GOAL #4   Title Report increased ability to rest at night when in sidelying due to decreased hip (likely IT band) pain. Target 07/09/14   Status Partially Met  pt reports the pain is decreasing but it is still difficult to lay on the side and rest at night; not as bad as it was at the beginning 07/07/14           PT Long Term Goals - 07/07/14 1603    PT LONG TERM GOAL #1   Title Verbalize understading of fall prevention education [employer] 08/10/14   PT LONG TERM GOAL #2   Title Increase Berg balance test score to 54/56. Target 08/10/14   PT LONG TERM GOAL #3   Title Decrease Oswestry score to 24% for decreased disability due to pain. Target 08/10/14   PT LONG TERM GOAL #4   Title Report increased ease with negotiating stairs for improved home access. [employer] 08/10/14   PT LONG TERM GOAL #5   Title Increase FOTO ABC goal to 35% for increased balance confidence. Target 08/10/14           Plan - 07/30/14 1238    Clinical Impression Statement Pt required min assistance with single leg stance for balance control.  Pt tolerated new hip stability exercises with expected fatigue, no complications.  Continue stretching and strengthening for decreased pain goals and increased balance activities.     Rehab Potential Good   PT Frequency 2x / week   PT Duration 8 weeks   PT Treatment/Interventions ADLs/Self Care Home Management;Moist Heat;Manual techniques;Balance training;Gait training;Therapeutic exercise;Therapeutic activities;Neuromuscular re-education;Stair training;Patient/family education  possible orthotics   PT Next Visit Plan Continue to work on lower extremetiy strengthening and balance. Begin checking LTG's next week.   Consulted and Agree with Plan of Care Patient         Problem List Patient Active Problem List   Diagnosis Date Noted  . Otalgia of right ear 03/19/2014  . Other fatigue 03/05/2014  . Prediabetes 09/09/2012  . Hyperlipidemia   . Depression with anxiety 02/21/2011  . Osteoarthritis 02/21/2011  . Hypothyroidism 05/06/2006  . Essential hypertension 05/06/2006  . KNEE PAIN 05/06/2006    [name] 07/30/2014, 4:29 PM  This entire session was performed under direct supervision and direction of a licensed therapist/therapist assistant . I have personally read, edited and approve of the note as written.  [name], PTA, [facility] [address] [place], [place] [address] [phone] 07/30/2014, 4:30 PM

## 2014-07-30 NOTE — Therapy (Deleted)
[facility] [address] [place], [place], [address] Phone: [phone]   Fax:  [phone]  Physical Therapy Treatment  Patient Details  Name: Joanna Gilbert MRN: [id number] Date of Birth: 1951-02-04 Referring Provider:  Clinton Gallant, MD  Encounter Date: 07/30/2014      PT End of Session - 07/30/14 1237    Visit Number 14   Number of Visits 17   Date for PT Re-Evaluation 08/10/14   Authorization Type GCCN expires 09/08/14   PT Start Time 1231   Equipment Utilized During Treatment Gait belt   Activity Tolerance Patient tolerated treatment well   Behavior During Therapy [facility] for tasks assessed/performed      Past Medical History  Diagnosis Date  . Hypertension   . Hypothyroidism   . Fibroid uterus 05/06/2006    Nov 2011: total abdominal hysterectomy with b/l salpingoopherectomy by Dr. Mora Bellman for symptomatic fibroids.  Qualifier: Diagnosis of  By: [name] MD, [name]    . Osteoarthritis 02/21/2011    MR b/l Hips (2009): Mild to moderate bilateral hip osteoarthritis, more notable on the right, with likely associated small area of anterior acetabular labral tearing.  MR R knee (2009): Dominant finding is marked interval progression in lateral compartment degenerative disease with new tearing in the posterior horn of the lateral meniscus. Persistent perimeniscal cyst formation related to lateral meniscal tearing as describe above MR Lumbar spine (2009) : 1. Dominant finding is advanced facet degenerative change L4-5 with small facet joint effusions and bony edema about the joints. This could be a source of back pain.  2. Negative for notable central canal and foraminal stenosis in patient with a somewhat congenitally narrowed central canal.    . Depression 02/21/2011  . Hyperlipidemia   . Prediabetes   . Plantar fasciitis, bilateral   . History of colonic polyps   . Diverticulosis     Past Surgical History  Procedure  Laterality Date  . Total abdominal hysterectomy w/ bilateral salpingoophorectomy  03/2010    because of symptomatic fibroids, Dr. Mora Bellman  . Knee surgery Right 2007, 2009    Had cyst removed  . Gastric restriction surgery  1980  . Cholecystectomy      There were no vitals taken for this visit.  Visit Diagnosis:  No diagnosis found.      Subjective Assessment - 07/30/14 1236    Symptoms No new complaints. Reports her knee feels better today.   Currently in Pain? Yes   Pain Score 5    Pain Location Knee   Pain Orientation Right   Pain Descriptors / Indicators Sore   Pain Type Chronic pain   Pain Onset More than a month ago   Pain Frequency Constant   Aggravating Factors  walking on hard surfaces   Pain Relieving Factors stretching                              PT Short Term Goals - 07/07/14 1324    PT SHORT TERM GOAL #1   Title Demonstrate correct performance of HEP Target 07/09/14   Status Not Met   PT SHORT TERM GOAL #2   Title Pt will increase Berg Balance Test to 53/56 for improved balance.   Status Not Met  51/56   PT SHORT TERM GOAL #3   Title Pt to decrease oswestry pain questionaire to 34% disabiled.    Status Not Met  score  decreased to 38% (from 42%) on 07/07/14   PT SHORT TERM GOAL #4   Title Report increased ability to rest at night when in sidelying due to decreased hip (likely IT band) pain. Target 07/09/14   Status Partially Met  pt reports the pain is decreasing but it is still difficult to lay on the side and rest at night; not as bad as it was at the beginning 07/07/14           PT Long Term Goals - 07/07/14 1603    PT LONG TERM GOAL #1   Title Verbalize understading of fall prevention education Target 08/10/14   PT LONG TERM GOAL #2   Title Increase Berg balance test score to 54/56. Target 08/10/14   PT LONG TERM GOAL #3   Title Decrease Oswestry score to 24% for decreased disability due to pain. Target 08/10/14   PT  LONG TERM GOAL #4   Title Report increased ease with negotiating stairs for improved home access. Target 08/10/14   PT LONG TERM GOAL #5   Title Increase FOTO ABC goal to 35% for increased balance confidence. Target 08/10/14               Plan - 07/30/14 1238    Rehab Potential Good   PT Frequency 2x / week   PT Duration 8 weeks   PT Treatment/Interventions ADLs/Self Care Home Management;Moist Heat;Manual techniques;Balance training;Gait training;Therapeutic exercise;Therapeutic activities;Neuromuscular re-education;Stair training;Patient/family education  possible orthotics   PT Next Visit Plan Continue to work on lower extremetiy strengthening and balance. Begin checking LTG's next week.   Consulted and Agree with Plan of Care Patient        Problem List Patient Active Problem List   Diagnosis Date Noted  . Otalgia of right ear 03/19/2014  . Other fatigue 03/05/2014  . Prediabetes 09/09/2012  . Hyperlipidemia   . Depression with anxiety 02/21/2011  . Osteoarthritis 02/21/2011  . Hypothyroidism 05/06/2006  . Essential hypertension 05/06/2006  . KNEE PAIN 05/06/2006    [name] 07/30/2014, 12:38 PM  [name], PTA, [facility] [address] [place], [place] [address] [phone] 07/30/2014, 12:38 PM

## 2014-08-04 ENCOUNTER — Ambulatory Visit: Payer: No Typology Code available for payment source

## 2014-08-04 DIAGNOSIS — R279 Unspecified lack of coordination: Secondary | ICD-10-CM

## 2014-08-04 DIAGNOSIS — R262 Difficulty in walking, not elsewhere classified: Secondary | ICD-10-CM

## 2014-08-04 DIAGNOSIS — M25561 Pain in right knee: Secondary | ICD-10-CM

## 2014-08-04 NOTE — Patient Instructions (Signed)
It is important to avoid accidents which may result in broken bones.  Here are a few ideas on how to make your home safer so you will be less likely to trip or fall.  1. Use nonskid mats or non slip strips in your shower or tub, on your bathroom floor and around sinks.  If you know that you have spilled water, wipe it up! 2. **In the bathroom, it is important to have properly installed grab bars on the walls or on the edge of the tub.  Towel racks are NOT strong enough for you to hold onto or to pull on for support. 3. Stairs and hallways should have enough light.  Add lamps or night lights if you need ore light. 4. It is good to have handrails on both sides of the stairs if possible.  Always fix broken handrails right away. 5. **It is important to see the edges of steps.  Paint the edges of outdoor steps white so you can see them better.  Put colored tape on the edge of inside steps; you may consider using textured paint on the outdoor steps to make the surface rough and less slippery 6. **Throw-rugs are dangerous because they can slide.  Removing the rugs is the best idea, but if they must stay, add adhesive carpet tape to prevent slipping. 7. Do not keep things on stairs or in the halls.  Remove small furniture that blocks the halls as it may cause you to trip.  Keep telephone and electrical cords out of the way where you walk. 8. **Always were sturdy, rubber-soled shoes for good support.  Never wear just socks, especially on the stairs.  Socks may cause you to slip or fall.  Do not wear full-length housecoats as you can easily trip on the bottom. Avoid flip flops. 9. Place the things you use the most on the shelves that are the easiest to reach.  If you use a stepstool, make sure it is in good condition.  If you feel unsteady, DO NOT climb, ask for help. 10. If a health professional advises you to use a cane or walker, do not be ashamed.  These items can keep you from falling and breaking your  bones.

## 2014-08-04 NOTE — Therapy (Signed)
[facility] [address] [place], [place], [address] Phone: [phone]   Fax:  [phone]  Physical Therapy Treatment  Patient Details  Name: Joanna Gilbert MRN: [id number] Date of Birth: 10-Apr-1951 Referring Provider:  Jerrye Noble, MD  Encounter Date: 08/04/2014      PT End of Session - 08/04/14 1237    Visit Number 15   Number of Visits 17   Date for PT Re-Evaluation 08/10/14   Authorization Type GCCN expires 09/08/14   PT Start Time 1150   PT Stop Time 1230   PT Time Calculation (min) 40 min      Past Medical History  Diagnosis Date  . Hypertension   . Hypothyroidism   . Fibroid uterus 05/06/2006    Nov 2011: total abdominal hysterectomy with b/l salpingoopherectomy by Dr. Mora Bellman for symptomatic fibroids.  Qualifier: Diagnosis of  By: [name] MD, [name]    . Osteoarthritis 02/21/2011    MR b/l Hips (2009): Mild to moderate bilateral hip osteoarthritis, more notable on the right, with likely associated small area of anterior acetabular labral tearing.  MR R knee (2009): Dominant finding is marked interval progression in lateral compartment degenerative disease with new tearing in the posterior horn of the lateral meniscus. Persistent perimeniscal cyst formation related to lateral meniscal tearing as describe above MR Lumbar spine (2009) : 1. Dominant finding is advanced facet degenerative change L4-5 with small facet joint effusions and bony edema about the joints. This could be a source of back pain.  2. Negative for notable central canal and foraminal stenosis in patient with a somewhat congenitally narrowed central canal.    . Depression 02/21/2011  . Hyperlipidemia   . Prediabetes   . Plantar fasciitis, bilateral   . History of colonic polyps   . Diverticulosis     Past Surgical History  Procedure Laterality Date  . Total abdominal hysterectomy w/ bilateral salpingoophorectomy  03/2010    because of  symptomatic fibroids, Dr. Mora Bellman  . Knee surgery Right 2007, 2009    Had cyst removed  . Gastric restriction surgery  1980  . Cholecystectomy      There were no vitals taken for this visit.  Visit Diagnosis:  Right knee pain  Lack of coordination  Difficulty walking down stairs      Subjective Assessment - 08/04/14 1157    Symptoms No new complaints. Reports her knee feels better today.   Currently in Pain? No/denies          [facility] PT Assessment - 08/04/14 0001    Standardized Balance Assessment   Standardized Balance Assessment Berg Balance Test                  [facility] Adult PT Treatment/Exercise - 08/04/14 0001    Berg Balance Test   Sit to Stand Able to stand without using hands and stabilize independently   Standing Unsupported Able to stand safely 2 minutes   Sitting with Back Unsupported but Feet Supported on Floor or Stool Able to sit safely and securely 2 minutes   Stand to Sit Sits safely with minimal use of hands   Transfers Able to transfer safely, minor use of hands   Standing Unsupported with Eyes Closed Able to stand 10 seconds safely   Standing Ubsupported with Feet Together Able to place feet together independently and stand 1 minute safely   From Standing, Reach Forward with Outstretched Arm Can reach confidently >25 cm (10")  From Standing Position, Pick up Object from [place] to pick up shoe safely and easily   From Standing Position, Turn to Look Behind Over each Shoulder Looks behind from both sides and weight shifts well   Turn 360 Degrees Able to turn 360 degrees safely in 4 seconds or less   Standing Unsupported, Alternately Place Feet on Step/Stool Able to stand independently and safely and complete 8 steps in 20 seconds   Standing Unsupported, One Foot in [place] to place foot tandem independently and hold 30 seconds   Standing on One Leg Able to lift leg independently and hold 5-10 seconds   Total Score 55      Neuro  Re-ed: Berg Balance Test Also assessed balance/eccentric knee control with negotiating stairs 3x4 stairs with bilateral hand rails. Continues to demonstrate poor eccentric control and safer with step-to pattern.  Therex: -Taught and performed squats 3x10 with education on how to progress the intensity of the squat for improved progress. -Also taught and performed single leg minisquats 2x10 each with single UE support Will add these to HEP next visit.  Self Care: Discussed fall prevention education with handout provided. See pt instructions for details.          PT Education - 08/04/14 1236    Education provided Yes   Education Details fall prevention ed   [employer]) Educated Patient   Methods Explanation;Handout   Comprehension Verbalized understanding          PT Short Term Goals - 07/07/14 1324    PT SHORT TERM GOAL #1   Title Demonstrate correct performance of HEP Target 07/09/14   Status Not Met   PT SHORT TERM GOAL #2   Title Pt will increase Berg Balance Test to 53/56 for improved balance.   Status Not Met  51/56   PT SHORT TERM GOAL #3   Title Pt to decrease oswestry pain questionaire to 34% disabiled.    Status Not Met  score decreased to 38% (from 42%) on 07/07/14   PT SHORT TERM GOAL #4   Title Report increased ability to rest at night when in sidelying due to decreased hip (likely IT band) pain. Target 07/09/14   Status Partially Met  pt reports the pain is decreasing but it is still difficult to lay on the side and rest at night; not as bad as it was at the beginning 07/07/14           PT Long Term Goals - 08/04/14 1159    PT LONG TERM GOAL #1   Title Verbalize understading of fall prevention education Target 08/10/14   Status Achieved   PT LONG TERM GOAL #2   Title Increase Berg balance test score to 54/56. Target 08/10/14   Status Achieved  55/56   PT LONG TERM GOAL #3   Title Decrease Oswestry score to 24% for decreased disability due to pain.  Target 08/10/14   PT LONG TERM GOAL #4   Title Report increased ease with negotiating stairs for improved home access. Target 08/10/14   Status Not Met   PT LONG TERM GOAL #5   Title Increase FOTO ABC goal to 35% for increased balance confidence. Target 08/10/14               Plan - 08/04/14 1238    Clinical Impression Statement Pt met Berg Balance Test goal with score of 55/56, demonstrating significant improvement in standing balance. She continues to have impaired eccentric control of quads  which is most notable with bending to pick up an object from the floor and with descending stairs.  Will update HEP and d/c next visit.   PT Next Visit Plan Add single leg squats and bilatera LE squats to HEP. Practice rising from floor and simulated rising in bathtub. Pt would like to determine if lumbar traction would be beneficial for her. Check remaining FOTO goals and D/C   Consulted and Agree with Plan of Care Patient        Problem List Patient Active Problem List   Diagnosis Date Noted  . Otalgia of right ear 03/19/2014  . Other fatigue 03/05/2014  . Prediabetes 09/09/2012  . Hyperlipidemia   . Depression with anxiety 02/21/2011  . Osteoarthritis 02/21/2011  . Hypothyroidism 05/06/2006  . Essential hypertension 05/06/2006  . KNEE PAIN 05/06/2006    Delrae Sawyers, PT,DPT,NCS 08/04/2014 12:48 PM Phone [phone] FAX [phone]         [facility] [address] [place], [place], [address] Phone: [phone]   Fax:  [phone]

## 2014-08-06 ENCOUNTER — Ambulatory Visit: Payer: No Typology Code available for payment source

## 2014-08-06 DIAGNOSIS — R279 Unspecified lack of coordination: Secondary | ICD-10-CM

## 2014-08-06 DIAGNOSIS — M25561 Pain in right knee: Secondary | ICD-10-CM

## 2014-08-06 DIAGNOSIS — M79604 Pain in right leg: Secondary | ICD-10-CM

## 2014-08-06 DIAGNOSIS — M79672 Pain in left foot: Secondary | ICD-10-CM

## 2014-08-06 DIAGNOSIS — M79671 Pain in right foot: Secondary | ICD-10-CM

## 2014-08-06 DIAGNOSIS — M79605 Pain in left leg: Secondary | ICD-10-CM

## 2014-08-06 NOTE — Therapy (Signed)
[facility] [address] [place], [place], [address] Phone: [phone]   Fax:  [phone]  Physical Therapy Treatment  Patient Details  Name: Joanna Gilbert MRN: [id number] Date of Birth: [birth year] Referring Provider:  Jerrye Noble, MD  Encounter Date: 08/06/2014      PT End of Session - 08/06/14 1333    Visit Number 16   Number of Visits 17   Date for PT Re-Evaluation 08/10/14   Authorization Type GCCN expires 09/08/14   PT Start Time 1235   PT Stop Time 1320   PT Time Calculation (min) 45 min      Past Medical History  Diagnosis Date  . Hypertension   . Hypothyroidism   . Fibroid uterus 05/06/2006    Nov 2011: total abdominal hysterectomy with b/l salpingoopherectomy by Dr. Mora Bellman for symptomatic fibroids.  Qualifier: Diagnosis of  By: [name] MD, [name]    . Osteoarthritis 02/21/2011    MR b/l Hips (2009): Mild to moderate bilateral hip osteoarthritis, more notable on the right, with likely associated small area of anterior acetabular labral tearing.  MR R knee (2009): Dominant finding is marked interval progression in lateral compartment degenerative disease with new tearing in the posterior horn of the lateral meniscus. Persistent perimeniscal cyst formation related to lateral meniscal tearing as describe above MR Lumbar spine (2009) : 1. Dominant finding is advanced facet degenerative change L4-5 with small facet joint effusions and bony edema about the joints. This could be a source of back pain.  2. Negative for notable central canal and foraminal stenosis in patient with a somewhat congenitally narrowed central canal.    . Depression 02/21/2011  . Hyperlipidemia   . Prediabetes   . Plantar fasciitis, bilateral   . History of colonic polyps   . Diverticulosis     Past Surgical History  Procedure Laterality Date  . Total abdominal hysterectomy w/ bilateral salpingoophorectomy  03/2010    because of  symptomatic fibroids, Dr. Mora Bellman  . Knee surgery Right 2007, 2009    Had cyst removed  . Gastric restriction surgery  1980  . Cholecystectomy      There were no vitals filed for this visit.  Visit Diagnosis:  Right knee pain  Bilateral leg and foot pain  Lack of coordination      Subjective Assessment - 08/06/14 1238    Symptoms (p) Must have worked too hard because I'm feeling sore.    Currently in Pain? (p) Yes   Pain Score (p) 6    Pain Location (p) --  r knee and calf and left posterior flank and both feet   Pain Descriptors / Indicators (p) Sore;Aching            [facility] PT Assessment - 08/06/14 0001    Observation/Other Assessments   Focus on Therapeutic Outcomes (FOTO)  Functional status= 53   Other Surveys  Select   Activities of Balance Confidence Scale (ABC Scale)  65.6   Oswestry Disability Index  33%       Self Care: Answered pt's questions regarding purpose of an inversion table and whether or not it would be beneficial to her. Therapist also made recommendations again regarding adding a grab bar to her tub/shower and potentially acquiring a tub transfer bench if getting in/out of the tub becomes more challenging with time. Discussed pt's progress in therapy. Therapist answered pt's questions regarding how to appropriately describe pain. (Pt was unsure of the meaning  of sharp, dull, throbbing, shooting etc.) Also explained the concept of delayed onset muscle soreness, and how this can be expected when building muscle.  Theract: Practiced rising from the floor and practiced a simulated tub transfer with therapist input on how to adjust movements for increased ease and efficiency. Explained to pt how she can practice tall kneeling weight shift and repeated tall kneeling to 1/2 kneeling to improve efficiency of this task.  Therex: 2x10 squats 1x10 minisquats with UE support Handout provided with explanation of purpose of this  exercise.                    PT Education - 08/06/14 1330    Education provided Yes   Education Details answered questions related to purpose of inversion table    Person(s) Educated Patient   Methods Explanation   Comprehension Verbalized understanding          PT Short Term Goals - 07/07/14 1324    PT SHORT TERM GOAL #1   Title Demonstrate correct performance of HEP Target 07/09/14   Status Not Met   PT SHORT TERM GOAL #2   Title Pt will increase Berg Balance Test to 53/56 for improved balance.   Status Not Met  51/56   PT SHORT TERM GOAL #3   Title Pt to decrease oswestry pain questionaire to 34% disabiled.    Status Not Met  score decreased to 38% (from 42%) on 07/07/14   PT SHORT TERM GOAL #4   Title Report increased ability to rest at night when in sidelying due to decreased hip (likely IT band) pain. Target 07/09/14   Status Partially Met  pt reports the pain is decreasing but it is still difficult to lay on the side and rest at night; not as bad as it was at the beginning 07/07/14           PT Long Term Goals - 08/06/14 1329    PT LONG TERM GOAL #1   Title Verbalize understading of fall prevention education Target 08/10/14   Status Achieved   PT LONG TERM GOAL #2   Title Increase Berg balance test score to 54/56. Target 08/10/14   Status Achieved  55/56   PT LONG TERM GOAL #3   Title Decrease Oswestry score to 24% for decreased disability due to pain. Target 08/10/14   Status Not Met  33% on d/c   PT LONG TERM GOAL #4   Title Report increased ease with negotiating stairs for improved home access. Target 08/10/14   Status Not Met   PT LONG TERM GOAL #5   Title Increase FOTO ABC goal to 35% for increased balance confidence. Target 08/10/14   Status Achieved  65.5% confidence               Plan - 08/06/14 1334    Clinical Impression Statement Pt ready for d/c today. Made progress with balance and reports decreased pain since start of  therapy. See goals section for additional details.   PT Next Visit Plan d/c today   Consulted and Agree with Plan of Care Patient        Problem List Patient Active Problem List   Diagnosis Date Noted  . Otalgia of right ear 03/19/2014  . Other fatigue 03/05/2014  . Prediabetes 09/09/2012  . Hyperlipidemia   . Depression with anxiety 02/21/2011  . Osteoarthritis 02/21/2011  . Hypothyroidism 05/06/2006  . Essential hypertension 05/06/2006  . KNEE PAIN 05/06/2006  Delrae Sawyers, PT,DPT,NCS 08/06/2014 1:49 PM Phone [phone] Joylene Igo [phone]         [facility] [address] [place], [place], [address] Phone: [phone]   Fax:  [phone]   PHYSICAL THERAPY DISCHARGE SUMMARY  Visits from Start of Care: 16  Current functional level related to goals / functional outcomes: Pt met 3/5 long term goals. See goals section above.   Remaining deficits: Continues to have bilateral foot pain, and right knee and calf pain.    Education / Equipment: Home exercise program  Plan: Patient agrees to discharge.  Patient goals were partially met. Patient is being discharged due to meeting the stated rehab goals.  ?????

## 2014-08-06 NOTE — Patient Instructions (Signed)
(  Home) Squat: (Assist)   Using supports like chairs or a counter as needed, squat by dropping hips back as if sitting in a chair. Repeat 10 times per set. Do 3 sets per session. Perform 3-5 days per week.  Copyright  VHI. All rights reserved.    Mini Squat: Single Leg   Stand on right foot. Hold onto a chair or counter for support. Reach forward for balance and do a mini squat. Keep knees in line with second toe. Knees do not go past toes. Keep knees apart. Repeat 10 times. Repeat with other leg for set. Do 2-3 sets per session.  http://plyo.exer.us/72   Copyright  VHI. All rights reserved.

## 2014-09-21 ENCOUNTER — Ambulatory Visit: Payer: No Typology Code available for payment source

## 2014-09-23 ENCOUNTER — Other Ambulatory Visit: Payer: Self-pay | Admitting: Internal Medicine

## 2014-09-27 ENCOUNTER — Encounter: Payer: Self-pay | Admitting: Internal Medicine

## 2014-09-27 ENCOUNTER — Ambulatory Visit (INDEPENDENT_AMBULATORY_CARE_PROVIDER_SITE_OTHER): Payer: No Typology Code available for payment source | Admitting: Internal Medicine

## 2014-09-27 VITALS — BP 141/58 | HR 74 | Temp 98.7°F | Ht 63.0 in | Wt 264.1 lb

## 2014-09-27 DIAGNOSIS — Z5900 Homelessness unspecified: Secondary | ICD-10-CM | POA: Insufficient documentation

## 2014-09-27 DIAGNOSIS — R21 Rash and other nonspecific skin eruption: Secondary | ICD-10-CM

## 2014-09-27 DIAGNOSIS — F329 Major depressive disorder, single episode, unspecified: Secondary | ICD-10-CM

## 2014-09-27 DIAGNOSIS — F32A Depression, unspecified: Secondary | ICD-10-CM

## 2014-09-27 MED ORDER — LEVOTHYROXINE SODIUM 100 MCG PO TABS: ORAL_TABLET | ORAL | Status: DC

## 2014-09-27 MED ORDER — TRIAMCINOLONE ACETONIDE 0.5 % EX OINT: 1.0000 "application " | TOPICAL_OINTMENT | Freq: Two times a day (BID) | CUTANEOUS | Status: DC

## 2014-09-27 NOTE — Patient Instructions (Signed)
General Instructions:   Please try to bring all your medicines next time. This will help [place] keep you safe from mistakes.  We will send you some cream for your skin. Try to use a different shampoo that hasn't bothered you before and see if that helps.   I have sent the social worker a note to let her know about the help you need. Feel free to call her tomorrow morning.  Give [facility] a call or see a counselor. If you have trouble with this or reconsider taking some medicine for a short time. Let me know.   Take care dear.  Progress Toward Treatment Goals:  Treatment Goal 09/09/2012  Blood pressure at goal    Self Care Goals & Plans:  Self Care Goal 09/27/2014  Manage my medications take my medicines as prescribed; bring my medications to every visit; refill my medications on time  Monitor my health -  Eat healthy foods drink diet soda or water instead of juice or soda; eat more vegetables; eat foods that are low in salt; eat baked foods instead of fried foods; eat fruit for snacks and desserts  Be physically active -    No flowsheet data found.   Care Management & Community Referrals:  No flowsheet data found.

## 2014-09-27 NOTE — Assessment & Plan Note (Signed)
Patient's home is in foreclosure. Resources and social work contact was given. -Consult to social work

## 2014-09-27 NOTE — Assessment & Plan Note (Signed)
This appears to be more of a contact dermatitis or irritation secondary to the new shampoo that she has been using. It seems to have responded to Claritin and some family members trazodone cream. Much of the rash has resolved today. -Prescription for triamcinolone ointment -Continue Claritin when necessary for pruritis  -Follow-up if fails to improve

## 2014-09-27 NOTE — Progress Notes (Signed)
INTERNAL MEDICINE TEACHING ATTENDING ADDENDUM - Rindi Beechy, MD: I reviewed and discussed at the time of visit with the resident Dr. Sadek, the patient's medical history, physical examination, diagnosis and results of pertinent tests and treatment and I agree with the patient's care as documented.  

## 2014-09-27 NOTE — Assessment & Plan Note (Signed)
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 She never did pursue hospice counseling. Medication use was discussed today the patient would like to pursue CBT therapy before SSRIs. She is not suicidal or homicidal today during this visit. An extensive list of resources was given for counseling. -Follow-up if fail to improve or if patient decides to start medication would begin with SSRI -Recommend CBT therapy

## 2014-09-27 NOTE — Progress Notes (Signed)
Subjective:   Patient ID: Joanna Gilbert female   DOB: 03/16/1951 64 y.o.   MRN: [id number]  HPI: Joanna Gilbert is a 64 y.o. woman pmh as listed below presents for depression and some eczema.   Pt states that she's had a long-standing course of eczema and several of her family members as well as have been giving her some of their creams to help with her outbreak. The patient states that this rash outbreak was slightly different in that it was extremely itchy and mostly concentrated on her face and back. She has been using some new smaller store shampoo that she has never had before and makes a slight association between the outbreak and the use of this new shampoo. The itching was resolved with over-the-counter Claritin she has been using triamcinolone cream from other relatives. She has had no fever, lymphadenopathy, scleral icterus, chest pain, shortness of breath, or other rashes.  The patient is very tearful today and she describes that her home is in foreclosure secondary to her inability to keep up with her expenses. She also starts reflecting on her husband's passing last year over a prolonged illness involving hospice. She has intense feelings of guilt for "hoping he would die and end his suffering." She has been unable to open up to any of her family members regarding her feelings and feels that her family "judges her and claims that she is weak if she asks for help." She states that she has seen mental health in the past but mostly in [place]. She is not interested in pursuing medication at this time. She is not homicidal or suicidal.   Past Medical History  Diagnosis Date  . Hypertension   . Hypothyroidism   . Fibroid uterus 05/06/2006    Nov 2011: total abdominal hysterectomy with b/l salpingoopherectomy by Dr. Mora Gilbert for symptomatic fibroids.  Qualifier: Diagnosis of  By: Joanna Starch MD, Joanna Gilbert    . Osteoarthritis 02/21/2011    MR b/l Hips (2009): Mild to moderate bilateral  hip osteoarthritis, more notable on the right, with likely associated small area of anterior acetabular labral tearing.  MR R knee (2009): Dominant finding is marked interval progression in lateral compartment degenerative disease with new tearing in the posterior horn of the lateral meniscus. Persistent perimeniscal cyst formation related to lateral meniscal tearing as describe above MR Lumbar spine (2009) : 1. Dominant finding is advanced facet degenerative change L4-5 with small facet joint effusions and bony edema about the joints. This could be a source of back pain.  2. Negative for notable central canal and foraminal stenosis in patient with a somewhat congenitally narrowed central canal.    . Depression 02/21/2011  . Hyperlipidemia   . Prediabetes   . Plantar fasciitis, bilateral   . History of colonic polyps   . Diverticulosis    Current Outpatient Prescriptions  Medication Sig Dispense Refill  . hydrochlorothiazide (HYDRODIURIL) 25 MG tablet Take 1 tablet (25 mg total) by mouth daily. 30 tablet 12  . ibuprofen (ADVIL,MOTRIN) 400 MG tablet Take 1-2 tablets as need for pain at bedtime 90 tablet 3  . levothyroxine (SYNTHROID, LEVOTHROID) 100 MCG tablet TAKE ONE TABLET BY MOUTH ONCE DAILY BEFORE  BREAKFAST 30 tablet 0  . loratadine (CLARITIN) 10 MG tablet Take 10 mg by mouth daily as needed for allergies.     No current facility-administered medications for this visit.   Family History  Problem Relation Age of Onset  . Alcohol abuse Mother   .  Heart disease Mother 66    required CABG  . Hypothyroidism Father   . Arthritis Sister   . Hypertension Sister    History   Social History  . Marital Status: Married    Spouse Name: N/A  . Number of Children: 2  . Years of Education: college   Occupational History  . unemployed     used to work as a Teacher, early years/pre   Social History Main Topics  . Smoking status: Never Smoker   . Smokeless tobacco: Never Used  . Alcohol Use: 0.0  oz/week    0 Standard drinks or equivalent per week  . Drug Use: No  . Sexual Activity: No   Other Topics Concern  . None   Social History Narrative   From [place], has lived in [place] since 1993 and lives with her husband. Can read and write fluently in [place].   Review of Systems: Pertinent items are noted in HPI. Objective:  Physical Exam: Filed Vitals:   09/27/14 0954  BP: 141/58  Pulse: 74  Temp: 98.7 F (37.1 C)  TempSrc: Oral  Height: 5\' 3"  (1.6 m)  Weight: 264 lb 1.6 oz (119.795 kg)  SpO2: 100%   General: sitting in chair, NAD, tearful  Cardiac: RRR, no rubs, murmurs or gallops Pulm: clear to auscultation bilaterally, moving normal volumes of air Abd: soft, nontender, nondistended, BS present Ext: warm and well perfused, no pedal edema, some hyperpigmented maculopapular rash on nape of neck and on the sides of face, no open sores or draining, no pus or vesicles   Assessment & Plan:  Please see problem oriented charting  Pt discussed with Dr. Dareen Gilbert

## 2014-10-01 ENCOUNTER — Telehealth: Payer: Self-pay | Admitting: Licensed Clinical Social Worker

## 2014-10-01 NOTE — Telephone Encounter (Signed)
CSW placed called to pt.  CSW left message requesting return call. CSW provided contact hours and phone number. 

## 2014-10-04 NOTE — Telephone Encounter (Signed)
CSW placed called to pt.  CSW left message requesting return call. CSW provided contact hours and phone number. 

## 2014-10-05 NOTE — Telephone Encounter (Signed)
CSW placed called to pt.  CSW left message requesting return call. CSW provided contact hours and phone number.  No response from patient.  Letter mailed, CSW will sign off.

## 2014-10-06 ENCOUNTER — Encounter: Payer: Self-pay | Admitting: *Deleted

## 2014-12-17 ENCOUNTER — Encounter: Payer: Self-pay | Admitting: Gastroenterology

## 2015-03-21 ENCOUNTER — Ambulatory Visit: Payer: No Typology Code available for payment source

## 2015-04-12 ENCOUNTER — Encounter: Payer: Self-pay | Admitting: Student

## 2015-05-13 ENCOUNTER — Ambulatory Visit: Payer: Self-pay | Admitting: Internal Medicine

## 2015-05-13 ENCOUNTER — Encounter: Payer: Self-pay | Admitting: Internal Medicine

## 2015-08-03 ENCOUNTER — Encounter: Payer: Self-pay | Admitting: Internal Medicine

## 2015-08-03 ENCOUNTER — Ambulatory Visit (INDEPENDENT_AMBULATORY_CARE_PROVIDER_SITE_OTHER): Payer: Self-pay | Admitting: Internal Medicine

## 2015-08-03 VITALS — BP 161/71 | HR 68 | Temp 98.3°F | Resp 18 | Ht 63.0 in | Wt 245.2 lb

## 2015-08-03 DIAGNOSIS — Z0000 Encounter for general adult medical examination without abnormal findings: Secondary | ICD-10-CM

## 2015-08-03 DIAGNOSIS — X509XXA Other and unspecified overexertion or strenuous movements or postures, initial encounter: Secondary | ICD-10-CM

## 2015-08-03 DIAGNOSIS — S39012A Strain of muscle, fascia and tendon of lower back, initial encounter: Secondary | ICD-10-CM

## 2015-08-03 DIAGNOSIS — R202 Paresthesia of skin: Secondary | ICD-10-CM

## 2015-08-03 DIAGNOSIS — R5381 Other malaise: Secondary | ICD-10-CM

## 2015-08-03 DIAGNOSIS — Z1211 Encounter for screening for malignant neoplasm of colon: Secondary | ICD-10-CM

## 2015-08-03 DIAGNOSIS — Z860100 Personal history of colon polyps, unspecified: Secondary | ICD-10-CM

## 2015-08-03 DIAGNOSIS — Y92009 Unspecified place in unspecified non-institutional (private) residence as the place of occurrence of the external cause: Secondary | ICD-10-CM

## 2015-08-03 DIAGNOSIS — E038 Other specified hypothyroidism: Secondary | ICD-10-CM

## 2015-08-03 DIAGNOSIS — G8929 Other chronic pain: Secondary | ICD-10-CM | POA: Insufficient documentation

## 2015-08-03 MED ORDER — MELOXICAM 7.5 MG PO TABS: 7.5000 mg | ORAL_TABLET | Freq: Every day | ORAL | Status: DC

## 2015-08-03 NOTE — Assessment & Plan Note (Signed)
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 I've checked an a1c, B12, and TSH today. Other considerations are multiple myeloma. We'll see if she is hypercalcemic or has renal dysfunction on her BMP today and can consider further work-up if necessary.

## 2015-08-03 NOTE — Progress Notes (Signed)
Patient ID: Joanna Gilbert, female   DOB: [birth year], 65 y.o.   MRN: [id number] [facility] Subjective:   Patient ID: Joanna Gilbert female   DOB: [birth year] 65 y.o.   MRN: [id number]  HPI: [name] is a 65 y.o. female with hypertension, hypothyroidism, allergic rhinitis, and atopic dermatitis presenting to clinic for follow-up of hypertension and hypothryoidism and evaluation for back pain and peripheral neuropathy.  Back pain: She felt she injured her back while moving furniture two days ago. The muscle is tender to the touch and ibuprofen has helped; she denies any radicular symptoms. She's been able to walk, denies bowel/bladder incontinence, nor any other red flags.  Peripheral neuropathy: For the last 3 months, she has had painful paresthesias in her hands and feet.  Hypertension: Her blood pressure today was normal on my check at 140/60. She had not been taking her HCTZ because it was too expensive and made her pee too often.  Hypothyroidism: She says she has felt more fatigued in the last few months and would like her TSH checked again.  I have reviewed her medications with her and she is not a smoker.  Review of Systems  Constitutional: Negative for fever, chills, weight loss and malaise/fatigue.  Cardiovascular: Negative for chest pain and palpitations.  Musculoskeletal: Positive for back pain. Negative for joint pain and falls.  Skin: Negative for itching and rash.  Neurological: Positive for tingling. Negative for speech change and focal weakness.    Objective:  Physical Exam: Filed Vitals:   08/03/15 1404  BP: 161/71  Pulse: 68  Temp: 98.3 F (36.8 C)  TempSrc: Oral  Resp: 18  Height: _0  (1.6 m)  Weight: 245 lb 3.2 oz (111.222 kg)  SpO2: 99%   General: resting in bed comfortably, appropriately conversational Cardiac: regular rate and rhythm, no rubs, murmurs or gallops Pulm: breathing well, clear to auscultation  bilaterally MSK: back with full range of motion, paraspinal muscles tender to palpation, negative straight leg raise bilaterally, strength 5/5 throughout  Assessment & Plan:  Case discussed with Dr. Lynnae January  Low back strain She strained her back muscles while moving furniture but does not radiculopathy nor any red flags. I recommended NSAIDs, rest, and ice; I don't think we need to pursue imaging right now.  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 I've checked an a1c, B12, and TSH today. Other considerations are multiple myeloma. We'll see if she is hypercalcemic or has renal dysfunction on her BMP today and can consider further work-up if necessary.  Healthcare maintenance I've screened her for hepatitis C and HIV today per her request.  History of colonic polyps She had benign adenomatous polyps noted on her colonoscopy in 2011 with a recommendation to have a repeat colonoscopy in 2016. I've placed the referral for her today.   Medications Ordered Meds ordered this encounter  Medications  . meloxicam (MOBIC) 7.5 MG tablet    Sig: Take 1 tablet (7.5 mg total) by mouth daily.    Dispense:  10 tablet    Refill:  0   Other Orders Orders Placed This Encounter  Procedures  . BMP8+Anion Gap  . CBC no Diff  . Vitamin B12  . TSH  . HIV antibody (with reflex)  . Hepatitis C antibody  . Hemoglobin A1C    Scheduling Instructions:     Add-on  . Ambulatory referral to Gastroenterology  Referral Priority:  Routine    Referral Type:  Consultation    Referral Reason:  Specialty Services Required    Number of Visits Requested:  1   Follow Up: Return in about 3 months (around 11/03/2015).

## 2015-08-03 NOTE — Patient Instructions (Addendum)
Ms. Bastardo,  It was very nice to meet you today.  I'm sorry to hear about your back pain. Either take Ibuprofen or the Meloxicam I sent to your pharmacy for one week. You can try ice as well.  For the tingling in your hands, I've checked some labs today and I'll call you with the results in the next few days if something is wrong.  We'll try to get you a colonoscopy done as well.  Take care and I'll see you in 3 months for your follow-up, Dr. Melburn Hake

## 2015-08-03 NOTE — Assessment & Plan Note (Signed)
She had benign adenomatous polyps noted on her colonoscopy in 2011 with a recommendation to have a repeat colonoscopy in 2016. I've placed the referral for her today.

## 2015-08-03 NOTE — Assessment & Plan Note (Signed)
She strained her back muscles while moving furniture but does not radiculopathy nor any red flags. I recommended NSAIDs, rest, and ice; I don't think we need to pursue imaging right now.

## 2015-08-03 NOTE — Assessment & Plan Note (Signed)
I've screened her for hepatitis C and HIV today per her request.

## 2015-08-04 ENCOUNTER — Encounter: Payer: Self-pay | Admitting: Internal Medicine

## 2015-08-04 LAB — HEPATITIS C ANTIBODY: Hep C Virus Ab: 0.2 s/co ratio (ref 0.0–0.9)

## 2015-08-04 LAB — CBC
HEMOGLOBIN: 12.8 g/dL (ref 11.1–15.9)
Hematocrit: 40.4 % (ref 34.0–46.6)
MCH: 28.9 pg (ref 26.6–33.0)
MCHC: 31.7 g/dL (ref 31.5–35.7)
MCV: 91 fL (ref 79–97)
Platelets: 324 10*3/uL (ref 150–379)
RBC: 4.43 x10E6/uL (ref 3.77–5.28)
RDW: 13.9 % (ref 12.3–15.4)
WBC: 7.2 10*3/uL (ref 3.4–10.8)

## 2015-08-04 LAB — BMP8+ANION GAP
ANION GAP: 19 mmol/L — AB (ref 10.0–18.0)
BUN/Creatinine Ratio: 14 (ref 11–26)
BUN: 12 mg/dL (ref 8–27)
CALCIUM: 9.6 mg/dL (ref 8.7–10.3)
CO2: 24 mmol/L (ref 18–29)
CREATININE: 0.84 mg/dL (ref 0.57–1.00)
Chloride: 100 mmol/L (ref 96–106)
GFR, EST AFRICAN AMERICAN: 86 mL/min/{1.73_m2} (ref >59)
GFR, EST NON AFRICAN AMERICAN: 75 mL/min/{1.73_m2} (ref >59)
Glucose: 91 mg/dL (ref 65–99)
Potassium: 4.2 mmol/L (ref 3.5–5.2)
Sodium: 143 mmol/L (ref 134–144)

## 2015-08-04 LAB — TSH: TSH: 1.46 u[IU]/mL (ref 0.450–4.500)

## 2015-08-04 LAB — HIV ANTIBODY (ROUTINE TESTING W REFLEX): HIV Screen 4th Generation wRfx: NONREACTIVE

## 2015-08-04 LAB — VITAMIN B12: Vitamin B-12: 565 pg/mL (ref 211–946)

## 2015-08-05 ENCOUNTER — Telehealth: Payer: Self-pay | Admitting: Internal Medicine

## 2015-08-05 NOTE — Progress Notes (Signed)
Internal Medicine Clinic Attending  Case discussed with Dr. Flores at the time of the visit.  We reviewed the resident's history and exam and pertinent patient test results.  I agree with the assessment, diagnosis, and plan of care documented in the resident's note. 

## 2015-08-05 NOTE — Telephone Encounter (Signed)
Pt requesting muscle relaxer med. Please call pt back.

## 2015-08-09 ENCOUNTER — Telehealth: Payer: Self-pay | Admitting: Pulmonary Disease

## 2015-08-09 NOTE — Telephone Encounter (Signed)
Dr Melburn Hake sent meloxicam in 3/8

## 2015-08-09 NOTE — Telephone Encounter (Signed)
Attempted to call back patient after I was paged. No answer. Did not leave voicemail.

## 2015-08-10 ENCOUNTER — Other Ambulatory Visit: Payer: Self-pay | Admitting: Internal Medicine

## 2015-08-10 ENCOUNTER — Telehealth: Payer: Self-pay | Admitting: Internal Medicine

## 2015-08-10 DIAGNOSIS — S39012A Strain of muscle, fascia and tendon of lower back, initial encounter: Secondary | ICD-10-CM

## 2015-08-10 MED ORDER — CYCLOBENZAPRINE HCL 5 MG PO TABS: 5.0000 mg | ORAL_TABLET | Freq: Three times a day (TID) | ORAL | Status: DC | PRN

## 2015-08-10 MED ORDER — MELOXICAM 7.5 MG PO TABS: 7.5000 mg | ORAL_TABLET | Freq: Every day | ORAL | Status: DC

## 2015-08-10 NOTE — Telephone Encounter (Signed)
I've tried calling a few times but her number on our chart is disconnected. I've sent in 1 week's worth of Flexeril for her back strain. If she calls back, please let her know her blood work was completely normal: her blood counts, electrolytes, B12 level, and thyroid levels all looked good, and she does not have hepatitis C nor HIV.  Thanks, Dr. Melburn Hake

## 2015-08-10 NOTE — Telephone Encounter (Signed)
Pt requesting the nurse to call back. 

## 2015-08-10 NOTE — Telephone Encounter (Signed)
i have tried calling with no answer

## 2015-08-10 NOTE — Telephone Encounter (Signed)
Asking for results from blood work. Pt asking for a rx for flexeril

## 2015-08-10 NOTE — Telephone Encounter (Signed)
Spoke to pt, told her she would have been called if there was something abnormal with her labs, explained she had been prescribed mobic not flexeril and that a request had been sent to her physician, she was agreeable

## 2015-08-11 ENCOUNTER — Other Ambulatory Visit (INDEPENDENT_AMBULATORY_CARE_PROVIDER_SITE_OTHER): Payer: Self-pay

## 2015-08-11 DIAGNOSIS — Z1211 Encounter for screening for malignant neoplasm of colon: Secondary | ICD-10-CM

## 2015-08-11 LAB — POC HEMOCCULT BLD/STL (HOME/3-CARD/SCREEN)
FECAL OCCULT BLD: NEGATIVE
FECAL OCCULT BLD: NEGATIVE
Fecal Occult Blood, POC: NEGATIVE

## 2015-08-11 NOTE — Addendum Note (Signed)
Addended by: Truddie Crumble on: 08/11/2015 01:48 PM   Modules accepted: Orders

## 2015-08-12 ENCOUNTER — Ambulatory Visit (AMBULATORY_SURGERY_CENTER): Payer: Self-pay | Admitting: *Deleted

## 2015-08-12 VITALS — Ht 63.0 in | Wt 249.0 lb

## 2015-08-12 DIAGNOSIS — Z86010 Personal history of colonic polyps: Secondary | ICD-10-CM

## 2015-08-12 MED ORDER — NA SULFATE-K SULFATE-MG SULF 17.5-3.13-1.6 GM/177ML PO SOLN: ORAL | Status: DC

## 2015-08-12 NOTE — Progress Notes (Signed)
No allergies to eggs or soy. No problems with anesthesia.  Pt not given Emmi instructions for colonoscopy; no email  No oxygen use  No diet drug use

## 2015-08-12 NOTE — Telephone Encounter (Signed)
No answer at 1 #, other # disconnected

## 2015-08-24 ENCOUNTER — Encounter: Payer: Self-pay | Admitting: Gastroenterology

## 2015-08-24 ENCOUNTER — Ambulatory Visit (AMBULATORY_SURGERY_CENTER): Payer: Self-pay | Admitting: Gastroenterology

## 2015-08-24 VITALS — BP 123/56 | HR 80 | Temp 96.0°F | Resp 14 | Ht 63.0 in | Wt 249.0 lb

## 2015-08-24 DIAGNOSIS — Z86010 Personal history of colonic polyps: Secondary | ICD-10-CM

## 2015-08-24 MED ORDER — SODIUM CHLORIDE 0.9 % IV SOLN: 500.0000 mL | INTRAVENOUS | Status: DC

## 2015-08-24 NOTE — Progress Notes (Signed)
A/ox3, pleased with MAC, report to RN 

## 2015-08-24 NOTE — Op Note (Signed)
Joanna Gilbert Patient Name: Joanna Gilbert Procedure Date: 08/24/2015 3:55 PM MRN: [id number] Endoscopist: Mallie Mussel L. Loletha Carrow , MD Age: 65 Referring MD:  Date of Birth: [birth year] Gender: Female Procedure:                Colonoscopy Indications:              Surveillance: Personal history of adenomatous                            polyps on last colonoscopy > 3 years ago (two subcm                            tubular adenomas in Aug 2011) Medicines:                Monitored Anesthesia Care Procedure:                Pre-Anesthesia Assessment:                           - Prior to the procedure, a History and Physical                            was performed, and patient medications and                            allergies were reviewed. [name] tolerance of                            previous anesthesia was also reviewed. The risks                            and benefits of the procedure and the sedation                            options and risks were discussed with the patient.                            All questions were answered, and informed consent                            was obtained. Prior Anticoagulants: The patient has                            taken no previous anticoagulant or antiplatelet                            agents. ASA Grade Assessment: II - A patient with                            mild systemic disease. After reviewing the risks                            and benefits, the patient was deemed in  satisfactory condition to undergo the procedure.                           After obtaining informed consent, the colonoscope                            was passed under direct vision. Throughout the                            procedure, the patient's blood pressure, pulse, and                            oxygen saturations were monitored continuously. The                            Model CF-HQ190L [id number]) scope was introduced                          through the anus and advanced to the the cecum,                            identified by appendiceal orifice and ileocecal                            valve. The Model PCF-H190L [id number]) scope was                            introduced through the and advanced to the. The                            colonoscopy was somewhat difficult due to a                            tortuous sigmoid colon. Successful completion of                            the procedure was aided by withdrawing the scope                            and replacing with the pediatric colonoscope, which                            passed with ease. The patient tolerated the                            procedure well. The quality of the bowel                            preparation was excellent. The ileocecal valve,                            appendiceal orifice, and rectum were photographed.  The bowel preparation used was Miralax. The quality                            of the bowel preparation was evaluated using the                            BBPS [employer] [place] Bowel Preparation Scale) with scores                            of: Right Colon = 3, Transverse Colon = 3 and Left                            Colon = 2. The total BBPS score equals 8. Scope In: 3:59:37 PM Scope Out: 4:22:37 PM Scope Withdrawal Time: 0 hours 9 minutes 29 seconds  Total Procedure Duration: 0 hours 23 minutes 0 seconds  Findings:      The perianal and digital rectal examinations were normal.      Multiple medium-mouthed diverticula were found in the sigmoid colon.      The exam was otherwise without abnormality on direct and retroflexion       views. Complications:            No immediate complications. Estimated Blood Loss:     Estimated blood loss: none. Impression:               - Diverticulosis in the sigmoid colon.                           - The examination was otherwise normal on direct                             and retroflexion views.                           - No specimens collected. Recommendation:           - Patient has a contact number available for                            emergencies. The signs and symptoms of potential                            delayed complications were discussed with the                            patient. Return to normal activities tomorrow.                            Written discharge instructions were provided to the                            patient.                           - Resume previous diet.                           -  Continue present medications.                           - Repeat colonoscopy in 10 years for surveillance. Procedure Code(s):        --- Professional ---                           [phone], Colonoscopy, flexible; diagnostic, including                            collection of specimen(s) by brushing or washing,                            when performed (separate procedure) CPT copyright 2016 [employer]. All rights reserved. Joanna Gilbert L. Loletha Carrow, MD 08/24/2015 4:31:46 PM This report has been signed electronically. Number of Addenda: 0 Referring MD:      Loleta Chance

## 2015-08-24 NOTE — Patient Instructions (Signed)
Diverticulosis seen, handout given on diverticulosis.  Repeat colonoscopy in 10 years. Call us with any questions or concerns. Thank you!  YOU HAD AN ENDOSCOPIC PROCEDURE TODAY AT [facility]:   Refer to the procedure report that was given to you for any specific questions about what was found during the examination.  If the procedure report does not answer your questions, please call your gastroenterologist to clarify.  If you requested that your care partner not be given the details of your procedure findings, then the procedure report has been included in a sealed envelope for you to review at your convenience later.  YOU SHOULD EXPECT: Some feelings of bloating in the abdomen. Passage of more gas than usual.  Walking can help get rid of the air that was put into your GI tract during the procedure and reduce the bloating. If you had a lower endoscopy (such as a colonoscopy or flexible sigmoidoscopy) you may notice spotting of blood in your stool or on the toilet paper. If you underwent a bowel prep for your procedure, you may not have a normal bowel movement for a few days.  Please Note:  You might notice some irritation and congestion in your nose or some drainage.  This is from the oxygen used during your procedure.  There is no need for concern and it should clear up in a day or so.  SYMPTOMS TO REPORT IMMEDIATELY:   Following lower endoscopy (colonoscopy or flexible sigmoidoscopy):  Excessive amounts of blood in the stool  Significant tenderness or worsening of abdominal pains  Swelling of the abdomen that is new, acute  Fever of 100F or higher   For urgent or emergent issues, a gastroenterologist can be reached at any hour by calling [phone].   DIET: Your first meal following the procedure should be a small meal and then it is ok to progress to your normal diet. Heavy or fried foods are harder to digest and may make you feel nauseous or bloated.  Likewise, meals  heavy in dairy and vegetables can increase bloating.  Drink plenty of fluids but you should avoid alcoholic beverages for 24 hours.  ACTIVITY:  You should plan to take it easy for the rest of today and you should NOT DRIVE or use heavy machinery until tomorrow (because of the sedation medicines used during the test).    FOLLOW UP: Our staff will call the number listed on your records the next business day following your procedure to check on you and address any questions or concerns that you may have regarding the information given to you following your procedure. If we do not reach you, we will leave a message.  However, if you are feeling well and you are not experiencing any problems, there is no need to return our call.  We will assume that you have returned to your regular daily activities without incident.  If any biopsies were taken you will be contacted by phone or by letter within the next 1-3 weeks.  Please call us at [phone] if you have not heard about the biopsies in 3 weeks.    SIGNATURES/CONFIDENTIALITY: You and/or your care partner have signed paperwork which will be entered into your electronic medical record.  These signatures attest to the fact that that the information above on your After Visit Summary has been reviewed and is understood.  Full responsibility of the confidentiality of this discharge information lies with you and/or your care-partner.

## 2015-08-25 ENCOUNTER — Telehealth: Payer: Self-pay | Admitting: Emergency Medicine

## 2015-08-25 NOTE — Telephone Encounter (Signed)
  Follow up Call-  Call back number 08/24/2015  Post procedure Call Back phone  # [phone]  Permission to leave phone message Yes     Patient questions:  Do you have a fever, pain , or abdominal swelling? No. Pain Score  0 *  Have you tolerated food without any problems? Yes.    Have you been able to return to your normal activities? Yes.    Do you have any questions about your discharge instructions: Diet   No. Medications  No. Follow up visit  No.  Do you have questions or concerns about your Care? No.  Actions: * If pain score is 4 or above: No action needed, pain <4.

## 2015-10-28 ENCOUNTER — Other Ambulatory Visit: Payer: Self-pay | Admitting: Internal Medicine

## 2015-11-15 ENCOUNTER — Encounter: Payer: Self-pay | Admitting: *Deleted

## 2015-11-24 ENCOUNTER — Other Ambulatory Visit: Payer: Self-pay | Admitting: Internal Medicine

## 2015-12-01 NOTE — Addendum Note (Signed)
Addended by: Orson Gear on: 12/01/2015 02:47 PM   Modules accepted: Orders

## 2015-12-28 ENCOUNTER — Ambulatory Visit: Payer: Self-pay

## 2016-03-19 ENCOUNTER — Ambulatory Visit: Payer: Self-pay

## 2016-05-14 ENCOUNTER — Encounter: Payer: Self-pay | Admitting: Internal Medicine

## 2016-05-14 ENCOUNTER — Encounter (INDEPENDENT_AMBULATORY_CARE_PROVIDER_SITE_OTHER): Payer: Self-pay

## 2016-05-14 ENCOUNTER — Ambulatory Visit (INDEPENDENT_AMBULATORY_CARE_PROVIDER_SITE_OTHER): Payer: Self-pay | Admitting: Internal Medicine

## 2016-05-14 VITALS — BP 139/55 | HR 66 | Temp 99.4°F | Ht 63.0 in | Wt 244.1 lb

## 2016-05-14 DIAGNOSIS — R202 Paresthesia of skin: Secondary | ICD-10-CM

## 2016-05-14 DIAGNOSIS — Z79899 Other long term (current) drug therapy: Secondary | ICD-10-CM

## 2016-05-14 DIAGNOSIS — E039 Hypothyroidism, unspecified: Secondary | ICD-10-CM

## 2016-05-14 DIAGNOSIS — E038 Other specified hypothyroidism: Secondary | ICD-10-CM

## 2016-05-14 DIAGNOSIS — R7303 Prediabetes: Secondary | ICD-10-CM

## 2016-05-14 DIAGNOSIS — Z0000 Encounter for general adult medical examination without abnormal findings: Secondary | ICD-10-CM

## 2016-05-14 LAB — GLUCOSE, CAPILLARY: GLUCOSE-CAPILLARY: 90 mg/dL (ref 65–99)

## 2016-05-14 LAB — POCT GLYCOSYLATED HEMOGLOBIN (HGB A1C): Hemoglobin A1C: 5.9

## 2016-05-14 MED ORDER — LEVOTHYROXINE SODIUM 100 MCG PO TABS: 100.0000 ug | ORAL_TABLET | Freq: Every day | ORAL | 6 refills | Status: DC

## 2016-05-14 NOTE — Assessment & Plan Note (Signed)
Last A1C in 2015 was 6.0. Rechecked today and was 5.9. Encouraged diet and lifestyle modification.

## 2016-05-14 NOTE — Patient Instructions (Signed)
A refill of your synthroid has been sent to your pharmacy. I will call you with the results of your A1C, the diabetes blood test. Please follow up in 6 months or sooner if you have issues. If you have any questions or concerns, call our clinic at [phone] or after hours call [phone] and ask for the internal medicine resident on call. Thank you!

## 2016-05-14 NOTE — Assessment & Plan Note (Signed)
Patient is complaining of intermittent numbness and tingling in her left hand that often bothers her at night. The distribution is not consistent with carpal tunnel syndrome and her exam was negative for phalen's and tinel's sign. She also reports that it sometimes radiates down from her shoulder. I feel this is most likely due to degenerative changes in her cervical spine causing radiculopathy rather than carpal tunnel. A1C was check today and was 5.9 ruling out diabetic neuropathy. Will follow up at next visit. If she continues to complain of these paresthesias we can consider cervical spine imaging vs nerve conduction studies to rule out radiculopathy or carpal tunnel, respectively.  -- Continue to monitor  -- Will consider cervical spine imaging at next visit if the paresthesia persist

## 2016-05-14 NOTE — Progress Notes (Signed)
   CC: Hypothyroidism f/u   HPI:  [name] is a 65 y.o. F here for follow up. She reports compliance with her synthroid and denies any fatigue, weight gain, racing heart/palpitations or irritability. She feels well aside from an intermittent numbness in her left hand. The numbness often comes at night while she is sleeping. It involves all fingers except the thumb. She does endorse some weakness in that hand and difficulty gripping. At times the paresthesias radiate down from her neck, and sometimes just involves her fingers. She does not work and denies any activities like typing that would put pressure on her median nerve. She is requesting me to fill out a handicap renewal form for her handicap pass today.     Past Medical History:  Diagnosis Date  . Anxiety   . Depression 02/21/2011  . Diverticulosis   . Fibroid uterus 05/06/2006   Nov 2011: total abdominal hysterectomy with b/l salpingoopherectomy by Dr. Mora Bellman for symptomatic fibroids.  Qualifier: Diagnosis of  By: [name] MD, [name]    . History of colonic polyps   . Hyperlipidemia   . Hypertension   . Hypothyroidism   . Osteoarthritis 02/21/2011   MR b/l Hips (2009): Mild to moderate bilateral hip osteoarthritis, more notable on the right, with likely associated small area of anterior acetabular labral tearing.  MR R knee (2009): Dominant finding is marked interval progression in lateral compartment degenerative disease with new tearing in the posterior horn of the lateral meniscus. Persistent perimeniscal cyst formation related to lateral meniscal tearing as describe above MR Lumbar spine (2009) : 1. Dominant finding is advanced facet degenerative change L4-5 with small facet joint effusions and bony edema about the joints. This could be a source of back pain.  2. Negative for notable central canal and foraminal stenosis in patient with a somewhat congenitally narrowed central canal.    . Plantar fasciitis, bilateral   .  Prediabetes   . Seasonal allergies     Review of Systems:  All pertinents listed in HPI, otherwise negative.   Physical Exam:  Vitals:   05/14/16 1332  BP: (!) 139/55  Pulse: 66  Temp: 99.4 F (37.4 C)  TempSrc: Oral  SpO2: 100%  Weight: 244 lb 1.6 oz (110.7 kg)  Height: 5\' 3"  (1.6 m)   Physical Exam Constitutional: Obese, NAD, appears comfortable Cardiovascular: RRR, no murmurs, rubs, or gallops.  Pulmonary/Chest: CTAB, no wheezes, rales, or rhonchi.  Abdominal: Soft, non tender, non distended. +BS.  Extremities: Left wrist negative for Phalen and Tinel's sign  Neurological: A&Ox3, CN II - XII grossly intact.  Skin: No rashes or erythema  Psychiatric: Normal mood and affect  Assessment & Plan:   See Encounters Tab for problem based charting.  Patient seen with Dr. Dareen Piano

## 2016-05-14 NOTE — Assessment & Plan Note (Addendum)
Last THS check 9 months ago was 1.46. She reports compliance with her synthroid 100 mcg daily. Her TSH has remained stable on this dose for many years.  -- Continue Synthroid 100 mcg, refills provided -- Recheck TSH at next visit

## 2016-05-14 NOTE — Assessment & Plan Note (Signed)
A1C today is 5.9. Educated patient on diet and lifestyle modifications. Will continue to monitor.

## 2016-05-15 NOTE — Progress Notes (Signed)
Internal Medicine Clinic Attending  I saw and evaluated the patient.  I personally confirmed the key portions of the history and exam documented by Dr. Guilloud and I reviewed pertinent patient test results.  The assessment, diagnosis, and plan were formulated together and I agree with the documentation in the resident's note.  

## 2016-07-18 ENCOUNTER — Telehealth: Payer: Self-pay | Admitting: Internal Medicine

## 2016-07-18 NOTE — Telephone Encounter (Signed)
APT. REMINDER CALL, NO ANSWER, NO VOICEMAIL °

## 2016-07-19 ENCOUNTER — Ambulatory Visit (INDEPENDENT_AMBULATORY_CARE_PROVIDER_SITE_OTHER): Payer: Self-pay | Admitting: Internal Medicine

## 2016-07-19 ENCOUNTER — Encounter: Payer: Self-pay | Admitting: Internal Medicine

## 2016-07-19 ENCOUNTER — Encounter (INDEPENDENT_AMBULATORY_CARE_PROVIDER_SITE_OTHER): Payer: Self-pay

## 2016-07-19 DIAGNOSIS — M7552 Bursitis of left shoulder: Secondary | ICD-10-CM

## 2016-07-19 DIAGNOSIS — M67911 Unspecified disorder of synovium and tendon, right shoulder: Secondary | ICD-10-CM | POA: Insufficient documentation

## 2016-07-19 DIAGNOSIS — M25412 Effusion, left shoulder: Secondary | ICD-10-CM

## 2016-07-19 DIAGNOSIS — M67912 Unspecified disorder of synovium and tendon, left shoulder: Secondary | ICD-10-CM | POA: Insufficient documentation

## 2016-07-19 DIAGNOSIS — Z9181 History of falling: Secondary | ICD-10-CM

## 2016-07-19 DIAGNOSIS — R202 Paresthesia of skin: Secondary | ICD-10-CM

## 2016-07-19 LAB — SYNOVIAL CELL COUNT + DIFF, W/ CRYSTALS
Crystals, Fluid: NONE SEEN
Eosinophils-Synovial: 0 % (ref 0–1)
Lymphocytes-Synovial Fld: 44 % — ABNORMAL HIGH (ref 0–20)
Monocyte-Macrophage-Synovial Fluid: 52 % (ref 50–90)
Neutrophil, Synovial: 3 % (ref 0–25)
WBC, Synovial: 197 /mm3 (ref 0–200)

## 2016-07-19 NOTE — Patient Instructions (Signed)
It was a pleasure to meet you today Joanna Gilbert,   For your shoulder pain,  You can continue using the ibuprofen if needed, just be sure not to use more than what is recommended on the back of your bottle.  Schedule a follow up appointment for about 3-4 weeks when Dr. Evette Doffing is here  For your wrist pain, try picking up a wrist splint

## 2016-07-19 NOTE — Progress Notes (Addendum)
CC: shoulder pain   HPI: [name] is a 66 y.o. with past medical history as outlined below who presents to clinic for follow up of left shoulder pain. She has had an aching anterior left shoulder pain which began in January and has gradually worsened. The pain is worse at night and whenever she lays down on the shoulder or puts pressure on it. She has had limited use of the shoulder feels like she cant lift it enough to perform ADLs. Use of the shoulder also worsens the pain. She did suffer a fall when she was mopping where she slipped and fell backwards and caught herself with her hands outstretched backward, she has a hard time remembering whether the pain began before or after the fall. Heat and ibuprofen have helped to relive the pain. It is also relieved when she keeps if flexed and resting at her side.   She also describes tingling in her fingers. She has noticed the tingling is worse in her left hand more than her right. She has tingling in all fingers except for her pinkies. She enjoys writing and has noticed that the tingling becomes worse when she writes.   Please see problem list for status of the pt's chronic medical problems.  Past Medical History:  Diagnosis Date  . Anxiety   . Depression 02/21/2011  . Diverticulosis   . Fibroid uterus 05/06/2006   Nov 2011: total abdominal hysterectomy with b/l salpingoopherectomy by Dr. Mora Gilbert for symptomatic fibroids.  Qualifier: Diagnosis of  By: [name] MD, [name]    . History of colonic polyps   . Hyperlipidemia   . Hypertension   . Hypothyroidism   . Osteoarthritis 02/21/2011   MR b/l Hips (2009): Mild to moderate bilateral hip osteoarthritis, more notable on the right, with likely associated small area of anterior acetabular labral tearing.  MR R knee (2009): Dominant finding is marked interval progression in lateral compartment degenerative disease with new tearing in the posterior horn of the lateral meniscus. Persistent  perimeniscal cyst formation related to lateral meniscal tearing as describe above MR Lumbar spine (2009) : 1. Dominant finding is advanced facet degenerative change L4-5 with small facet joint effusions and bony edema about the joints. This could be a source of back pain.  2. Negative for notable central canal and foraminal stenosis in patient with a somewhat congenitally narrowed central canal.    . Plantar fasciitis, bilateral   . Prediabetes   . Seasonal allergies     Review of Systems:  Please see each problem below for a pertinent review of systems.  Physical Exam:  Vitals:   07/19/16 0911  BP: (!) 159/66  Pulse: 64  Temp: 98.5 F (36.9 C)  TempSrc: Oral  SpO2: 100%  Weight: 243 lb 1.6 oz (110.3 kg)  Height: 5\' 3"  (1.6 m)   Physical Exam  Constitutional: She appears well-developed and well-nourished. No distress.  HENT:  Head: Normocephalic and atraumatic.  Eyes: Conjunctivae are normal. No scleral icterus.  Musculoskeletal:  Left shoulder- no deforminty, erythema, or warmth Left shoulder passive forward flexion and abduction limited Left shoulder abduction strength 4/5, right shoulder abduction strength 5/5  Elbow flexion and extension, wrist flexion and extension, and hand grip strength intact and equal.  Tinel and phalens negative   Skin: Skin is warm and dry. She is not diaphoretic.  Psychiatric: She has a normal mood and affect. Her behavior is normal.    Assessment & Plan:   See Encounters  Tab for problem based charting.  Rotator cuff tendinopathy  Hx and exam were concerning for rotator cuff tendinopathy. She was offered a steroid injection but was initially hesitant and described a traumatic experience with steroid hip injection in the past. She opted for POC - [place], where we found a enlarged subacromial  and subdeltoid bursa with possible partial tear of the subscapularis muscle. She then opted for arthrocentesis and steroid injection today.  - continue ibuprofen    -RTC in 3 weeks   Addendum: Synovial fluid sent for cell count and crystal analysis: no crystals seen, wbc count 197.   Shoulder Injection Procedure Note  Pre-operative Diagnosis: left shoulder subdeltoid fluid collection  Indications: Symptomatic relief of large effusion  Anesthesia: Lidocaine 1% without epinephrine   Procedure Details   Point of care ultrasound was used to identify the subacromial bursa and evaluate for rotator cuff tendinopathy or tears. Consent was obtained for the procedure. The shoulder was prepped with iodine. Using a 22 gauge needle the subacromial bursa was injected with  triamcinolone (KENALOG) 40mg /ml. The needle was removed and a dressing was applied.  Complications:  None; patient tolerated the procedure well.  Transverse view of the left anterior shoulder showing enlarged subdeltoid bursa. There was also diffuse tendinopathy of the anterior rotator cuff seen.    Hand tingling Distribution of tingling could be caused by median nerve compression affecting palmar digital branch of the median nerve distal to palmar cutaneous branch. We discussed the option to obtain EMG but given the cost of this test and this patient being uninsured, she opted to attempt empiric treatment with wrist splinting.  - Trial of wrist splinting  -RTC 3 weeks    Patient seen with Dr. Eppie Gibson

## 2016-07-20 ENCOUNTER — Other Ambulatory Visit: Payer: Self-pay | Admitting: Internal Medicine

## 2016-07-20 DIAGNOSIS — S39012A Strain of muscle, fascia and tendon of lower back, initial encounter: Secondary | ICD-10-CM

## 2016-07-25 NOTE — Assessment & Plan Note (Signed)
New description of distribution of tingling could be caused by median nerve compression affecting palmar digital branch of the median nerve distal to palmar cutaneous branch. We discussed the option to obtain EMG but this could be a large cost for her being uninsured, she opted to attempt empiric treatment with wrist splinting.  - Trial of wrist splinting  -RTC 3 weeks

## 2016-07-25 NOTE — Assessment & Plan Note (Signed)
Hx and exam were concerning for rotator cuff tendinopathy. She was offered a steroid injection but was initially hesitant and described a traumatic experience with steroid hip injection in the past. She opted for POC - [place], where we found a enlarged subacromial  and subdeltoid bursa with possible partial tear of the subscapularis muscle. She then opted for arthrocentesis and steroid injection today.  - continue ibuprofen  -RTC in 3 weeks   Addendum: Synovial fluid sent for cell count and crystal analysis: no crystals seen, wbc count 197.

## 2016-07-25 NOTE — Progress Notes (Signed)
I saw and evaluated the patient.  I personally confirmed the key portions of Dr. Blum's history and exam and reviewed pertinent patient test results.  The assessment, diagnosis, and plan were formulated together and I agree with the documentation in the resident's note. 

## 2016-08-10 ENCOUNTER — Ambulatory Visit (INDEPENDENT_AMBULATORY_CARE_PROVIDER_SITE_OTHER): Payer: Self-pay | Admitting: Internal Medicine

## 2016-08-10 DIAGNOSIS — M7581 Other shoulder lesions, right shoulder: Secondary | ICD-10-CM

## 2016-08-10 DIAGNOSIS — M7582 Other shoulder lesions, left shoulder: Secondary | ICD-10-CM

## 2016-08-10 DIAGNOSIS — M67912 Unspecified disorder of synovium and tendon, left shoulder: Principal | ICD-10-CM

## 2016-08-10 DIAGNOSIS — M67911 Unspecified disorder of synovium and tendon, right shoulder: Secondary | ICD-10-CM

## 2016-08-10 NOTE — Progress Notes (Signed)
   CC: Rotator cuff tendinopathy follow up   HPI:  [name] is a 66 y.o. F with past medical history outlined below here for follow up of bilateral rotator cuff tendinopathy. For the details of today's visit please refer to the assessment and plan.  Past Medical History:  Diagnosis Date  . Anxiety   . Depression 02/21/2011  . Diverticulosis   . Fibroid uterus 05/06/2006   Nov 2011: total abdominal hysterectomy with b/l salpingoopherectomy by Dr. Mora Gilbert for symptomatic fibroids.  Qualifier: Diagnosis of  By: Joanna Starch MD, Joanna Gilbert    . History of colonic polyps   . Hyperlipidemia   . Hypertension   . Hypothyroidism   . Osteoarthritis 02/21/2011   MR b/l Hips (2009): Mild to moderate bilateral hip osteoarthritis, more notable on the right, with likely associated small area of anterior acetabular labral tearing.  MR R knee (2009): Dominant finding is marked interval progression in lateral compartment degenerative disease with new tearing in the posterior horn of the lateral meniscus. Persistent perimeniscal cyst formation related to lateral meniscal tearing as describe above MR Lumbar spine (2009) : 1. Dominant finding is advanced facet degenerative change L4-5 with small facet joint effusions and bony edema about the joints. This could be a source of back pain.  2. Negative for notable central canal and foraminal stenosis in patient with a somewhat congenitally narrowed central canal.    . Plantar fasciitis, bilateral   . Prediabetes   . Seasonal allergies     Review of Systems:  All pertinents listed in HPI, otherwise negative  Physical Exam:  Vitals:   08/10/16 0952 08/10/16 1123  BP: (!) 158/63 (!) 130/59  Pulse: 64 61  Temp: 98.7 F (37.1 C)   TempSrc: Oral   SpO2: 99%   Weight: 238 lb 12.8 oz (108.3 kg)   Height: 5\' 3"  (1.6 m)     Constitutional: NAD, appears comfortable Cardiovascular: RRR  Pulmonary/Chest: CTAB Extremities: Bilateral shoulder ROM intact.  Empty can test performed without pain or weakness. External and internal rotation against resistance without pain.  Neurological: A&Ox3, CN II - XII grossly intact.    Assessment & Plan:   See Encounters Tab for problem based charting.  Patient seen with Dr. Evette Gilbert

## 2016-08-10 NOTE — Assessment & Plan Note (Signed)
Patient is here today for follow up of rotator cuff tendinopathy. She was seen in clinic two weeks ago for left shoulder pain. Bedside POC ultrasound revealed an enlarged subacromial and subdeltoid bursa with possible partial tear of the subscapularis muscle. Arthrocentesis was performed and fluid analysis was negative for crystals or infection. She then received a subacromial bursa steroid injection. Today, she reports significant symptom relief with the steroid injection, says she feels "98% better". ROM and strength are intact on exam. Repeat POC ultrasound today shows resolution of her left subdeltoid bursa fluid collection. She reports some mild pain of her right shoulder with repetitive use. POC ultrasound of her right shoulder today revealed thickening of her right supraspinatus tendon, bursa was not visualized. Because her pain is minimal we opted to treat conservatively with NSAIDs and home exercises.  -- F/u 3 months as needed for repeat steroid injection of the left shoulder if pain returns  -- Ibuprofen prn  -- Patient given handout for home shoulder exercises

## 2016-08-10 NOTE — Patient Instructions (Signed)
Joanna Gilbert,   It was a pleasure seeing you today. I am glad your shoulder is doing so much better! I have given you some shoulder exercises to do at home to help strengthen the muscles in your shoulder. Please return in 3 months or sooner if needed. If you have any questions or concerns, call our clinic at [phone] or after hours call [phone] and ask for the internal medicine resident on call. Thank you!   Shoulder Exercises Ask your health care provider which exercises are safe for you. Do exercises exactly as told by your health care provider and adjust them as directed. It is normal to feel mild stretching, pulling, tightness, or discomfort as you do these exercises, but you should stop right away if you feel sudden pain or your pain gets worse.Do not begin these exercises until told by your health care provider. RANGE OF MOTION EXERCISES  These exercises warm up your muscles and joints and improve the movement and flexibility of your shoulder. These exercises also help to relieve pain, numbness, and tingling. These exercises involve stretching your injured shoulder directly. Exercise A: Pendulum   1. Stand near a wall or a surface that you can hold onto for balance. 2. Bend at the waist and let your left / right arm hang straight down. Use your other arm to support you. Keep your back straight and do not lock your knees. 3. Relax your left / right arm and shoulder muscles, and move your hips and your trunk so your left / right arm swings freely. Your arm should swing because of the motion of your body, not because you are using your arm or shoulder muscles. 4. Keep moving your body so your arm swings in the following directions, as told by your health care provider:  Side to side.  Forward and backward.  In clockwise and counterclockwise circles. 5. Continue each motion for __________ seconds, or for as long as told by your health care provider. 6. Slowly return to the starting  position. Repeat __________ times. Complete this exercise __________ times a day. Exercise B:Flexion, Standing   1. Stand and hold a broomstick, a cane, or a similar object. Place your hands a little more than shoulder-width apart on the object. Your left / right hand should be palm-up, and your other hand should be palm-down. 2. Keep your elbow straight and keep your shoulder muscles relaxed. Push the stick down with your healthy arm to raise your left / right arm in front of your body, and then over your head until you feel a stretch in your shoulder.  Avoid shrugging your shoulder while you raise your arm. Keep your shoulder blade tucked down toward the middle of your back. 3. Hold for __________ seconds. 4. Slowly return to the starting position. Repeat __________ times. Complete this exercise __________ times a day. Exercise C: Abduction, Standing  1. Stand and hold a broomstick, a cane, or a similar object. Place your hands a little more than shoulder-width apart on the object. Your left / right hand should be palm-up, and your other hand should be palm-down. 2. While keeping your elbow straight and your shoulder muscles relaxed, push the stick across your body toward your left / right side. Raise your left / right arm to the side of your body and then over your head until you feel a stretch in your shoulder.  Do not raise your arm above shoulder height, unless your health care provider tells you to do  that.  Avoid shrugging your shoulder while you raise your arm. Keep your shoulder blade tucked down toward the middle of your back. 3. Hold for __________ seconds. 4. Slowly return to the starting position. Repeat __________ times. Complete this exercise __________ times a day. Exercise D:Internal Rotation   1. Place your left / right hand behind your back, palm-up. 2. Use your other hand to dangle an exercise band, a towel, or a similar object over your shoulder. Grasp the band with  your left / right hand so you are holding onto both ends. 3. Gently pull up on the band until you feel a stretch in the front of your left / right shoulder.  Avoid shrugging your shoulder while you raise your arm. Keep your shoulder blade tucked down toward the middle of your back. 4. Hold for __________ seconds. 5. Release the stretch by letting go of the band and lowering your hands. Repeat __________ times. Complete this exercise __________ times a day. STRETCHING EXERCISES  These exercises warm up your muscles and joints and improve the movement and flexibility of your shoulder. These exercises also help to relieve pain, numbness, and tingling. These exercises are done using your healthy shoulder to help stretch the muscles of your injured shoulder. Exercise E: Warehouse manager (External Rotation and Abduction)   1. Stand in a doorway with one of your feet slightly in front of the other. This is called a staggered stance. If you cannot reach your forearms to the door frame, stand facing a corner of a room. 2. Choose one of the following positions as told by your health care provider:  Place your hands and forearms on the door frame above your head.  Place your hands and forearms on the door frame at the height of your head.  Place your hands on the door frame at the height of your elbows. 3. Slowly move your weight onto your front foot until you feel a stretch across your chest and in the front of your shoulders. Keep your head and chest upright and keep your abdominal muscles tight. 4. Hold for __________ seconds. 5. To release the stretch, shift your weight to your back foot. Repeat __________ times. Complete this stretch __________ times a day. Exercise F:Extension, Standing  1. Stand and hold a broomstick, a cane, or a similar object behind your back.  Your hands should be a little wider than shoulder-width apart.  Your palms should face away from your back. 2. Keeping your elbows  straight and keeping your shoulder muscles relaxed, move the stick away from your body until you feel a stretch in your shoulder.  Avoid shrugging your shoulders while you move the stick. Keep your shoulder blade tucked down toward the middle of your back. 3. Hold for __________ seconds. 4. Slowly return to the starting position. Repeat __________ times. Complete this exercise __________ times a day. STRENGTHENING EXERCISES  These exercises build strength and endurance in your shoulder. Endurance is the ability to use your muscles for a long time, even after they get tired. Exercise G:External Rotation   1. Sit in a stable chair without armrests. 2. Secure an exercise band at elbow height on your left / right side. 3. Place a soft object, such as a folded towel or a small pillow, between your left / right upper arm and your body to move your elbow a few inches away (about 10 cm) from your side. 4. Hold the end of the band so it is tight  and there is no slack. 5. Keeping your elbow pressed against the soft object, move your left / right forearm out, away from your abdomen. Keep your body steady so only your forearm moves. 6. Hold for __________ seconds. 7. Slowly return to the starting position. Repeat __________ times. Complete this exercise __________ times a day. Exercise H:Shoulder Abduction   1. Sit in a stable chair without armrests, or stand. 2. Hold a __________ weight in your left / right hand, or hold an exercise band with both hands. 3. Start with your arms straight down and your left / right palm facing in, toward your body. 4. Slowly lift your left / right hand out to your side. Do not lift your hand above shoulder height unless your health care provider tells you that this is safe.  Keep your arms straight.  Avoid shrugging your shoulder while you do this movement. Keep your shoulder blade tucked down toward the middle of your back. 5. Hold for __________  seconds. 6. Slowly lower your arm, and return to the starting position. Repeat __________ times. Complete this exercise __________ times a day. Exercise I:Shoulder Extension  1. Sit in a stable chair without armrests, or stand. 2. Secure an exercise band to a stable object in front of you where it is at shoulder height. 3. Hold one end of the exercise band in each hand. Your palms should face each other. 4. Straighten your elbows and lift your hands up to shoulder height. 5. Step back, away from the secured end of the exercise band, until the band is tight and there is no slack. 6. Squeeze your shoulder blades together as you pull your hands down to the sides of your thighs. Stop when your hands are straight down by your sides. Do not let your hands go behind your body. 7. Hold for __________ seconds. 8. Slowly return to the starting position. Repeat __________ times. Complete this exercise __________ times a day. Exercise J:Standing Shoulder Row  1. Sit in a stable chair without armrests, or stand. 2. Secure an exercise band to a stable object in front of you so it is at waist height. 3. Hold one end of the exercise band in each hand. Your palms should be in a thumbs-up position. 4. Bend each of your elbows to an "L" shape (about 90 degrees) and keep your upper arms at your sides. 5. Step back until the band is tight and there is no slack. 6. Slowly pull your elbows back behind you. 7. Hold for __________ seconds. 8. Slowly return to the starting position. Repeat __________ times. Complete this exercise __________ times a day. Exercise K:Shoulder Press-Ups   1. Sit in a stable chair that has armrests. Sit upright, with your feet flat on the floor. 2. Put your hands on the armrests so your elbows are bent and your fingers are pointing forward. Your hands should be about even with the sides of your body. 3. Push down on the armrests and use your arms to lift yourself off of the chair.  Straighten your elbows and lift yourself up as much as you comfortably can.  Move your shoulder blades down, and avoid letting your shoulders move up toward your ears.  Keep your feet on the ground. As you get stronger, your feet should support less of your body weight as you lift yourself up. 4. Hold for __________ seconds. 5. Slowly lower yourself back into the chair. Repeat __________ times. Complete this exercise __________ times a  day. Exercise L: Wall Push-Ups   1. Stand so you are facing a stable wall. Your feet should be about one arm-length away from the wall. 2. Lean forward and place your palms on the wall at shoulder height. 3. Keep your feet flat on the floor as you bend your elbows and lean forward toward the wall. 4. Hold for __________ seconds. 5. Straighten your elbows to push yourself back to the starting position. Repeat __________ times. Complete this exercise __________ times a day. This information is not intended to replace advice given to you by your health care provider. Make sure you discuss any questions you have with your health care provider. Document Released: 03/28/2005 Document Revised: 02/06/2016 Document Reviewed: 01/23/2015 Elsevier Interactive Patient Education  2017 [employer].

## 2016-08-10 NOTE — Progress Notes (Signed)
Internal Medicine Clinic Attending  I saw and evaluated the patient.  I personally confirmed the key portions of the history and exam documented by Dr. Guilloud and I reviewed pertinent patient test results.  The assessment, diagnosis, and plan were formulated together and I agree with the documentation in the resident's note.  

## 2016-09-28 ENCOUNTER — Telehealth: Payer: Self-pay | Admitting: Internal Medicine

## 2016-09-28 NOTE — Telephone Encounter (Signed)
CALLED PATIENT, LMTCB, TIME TO RENEW GCCN CARD °

## 2016-10-05 ENCOUNTER — Telehealth: Payer: Self-pay | Admitting: Internal Medicine

## 2016-10-05 NOTE — Telephone Encounter (Signed)
APT. REMINDER CALL, LMTCB °

## 2016-10-15 ENCOUNTER — Ambulatory Visit: Payer: Self-pay

## 2017-01-24 ENCOUNTER — Other Ambulatory Visit: Payer: Self-pay | Admitting: Internal Medicine

## 2017-01-24 DIAGNOSIS — E038 Other specified hypothyroidism: Secondary | ICD-10-CM

## 2017-08-16 ENCOUNTER — Other Ambulatory Visit: Payer: Self-pay | Admitting: *Deleted

## 2017-08-16 DIAGNOSIS — E038 Other specified hypothyroidism: Secondary | ICD-10-CM

## 2017-08-16 MED ORDER — LEVOTHYROXINE SODIUM 100 MCG PO TABS: ORAL_TABLET | ORAL | 0 refills | Status: DC

## 2017-08-16 NOTE — Telephone Encounter (Signed)
Approved 1 month refill of synthroid. Please have patient schedule a follow up appointment at her earliest convenience. [facility] is fine if my schedule is full. She needs a repeat TSH. Thanks.

## 2017-08-27 ENCOUNTER — Other Ambulatory Visit: Payer: Self-pay | Admitting: *Deleted

## 2017-08-27 DIAGNOSIS — E038 Other specified hypothyroidism: Secondary | ICD-10-CM

## 2017-08-27 NOTE — Telephone Encounter (Addendum)
Received fax from [facility] requesting refills on levothyroxine. 30 day supply sent on 08/16/2017 to allow patient enough time to sched appt with PCP as last OV 08/10/2016. Patient has appt in [facility] on 09/17/2017. Attempted to reach patient at both numbers to schedule earlier appt. No answer and no VM set-up. Will route request to PCP for review. Hubbard Hartshorn, RN, BSN

## 2017-08-28 MED ORDER — LEVOTHYROXINE SODIUM 100 MCG PO TABS: ORAL_TABLET | ORAL | 0 refills | Status: DC

## 2017-08-28 NOTE — Telephone Encounter (Signed)
I had already approved 1 month refill on 08/16/17 with the expectation that she come in for follow up. It looks like an appointment was never scheduled. I will approve one additional month but will not approve any more until she is seen in clinic. 30 day prescription sent to [facility] on [address]. Thanks.

## 2017-09-17 ENCOUNTER — Ambulatory Visit: Payer: Self-pay

## 2017-09-17 ENCOUNTER — Encounter: Payer: Self-pay | Admitting: Internal Medicine

## 2017-09-20 ENCOUNTER — Ambulatory Visit: Payer: Self-pay

## 2017-10-28 ENCOUNTER — Other Ambulatory Visit: Payer: Self-pay | Admitting: *Deleted

## 2017-10-28 DIAGNOSIS — E038 Other specified hypothyroidism: Secondary | ICD-10-CM

## 2017-10-29 MED ORDER — LEVOTHYROXINE SODIUM 100 MCG PO TABS
ORAL_TABLET | ORAL | 0 refills | Status: DC
Start: 2017-10-29 — End: 2017-12-31

## 2017-10-29 NOTE — Telephone Encounter (Signed)
Approved 1 month refill of Synthroid. Please have patient schedule a follow up appointment. Needs repeat TSH. Thanks.

## 2017-11-04 ENCOUNTER — Encounter: Payer: Self-pay | Admitting: Internal Medicine

## 2017-11-18 ENCOUNTER — Encounter: Payer: Self-pay | Admitting: Internal Medicine

## 2017-12-09 ENCOUNTER — Other Ambulatory Visit: Payer: Self-pay | Admitting: Internal Medicine

## 2017-12-09 DIAGNOSIS — E038 Other specified hypothyroidism: Secondary | ICD-10-CM

## 2017-12-10 NOTE — Telephone Encounter (Signed)
Approved another 1 month of synthroid. Please have her scheduled in [facility] for repeat TSH.

## 2017-12-27 ENCOUNTER — Ambulatory Visit: Payer: Self-pay

## 2017-12-31 ENCOUNTER — Ambulatory Visit (INDEPENDENT_AMBULATORY_CARE_PROVIDER_SITE_OTHER): Payer: Self-pay | Admitting: Internal Medicine

## 2017-12-31 ENCOUNTER — Encounter: Payer: Self-pay | Admitting: Internal Medicine

## 2017-12-31 ENCOUNTER — Other Ambulatory Visit: Payer: Self-pay

## 2017-12-31 VITALS — BP 147/80 | HR 79 | Temp 99.5°F | Ht 63.0 in | Wt 240.0 lb

## 2017-12-31 DIAGNOSIS — R030 Elevated blood-pressure reading, without diagnosis of hypertension: Secondary | ICD-10-CM

## 2017-12-31 DIAGNOSIS — E038 Other specified hypothyroidism: Secondary | ICD-10-CM

## 2017-12-31 DIAGNOSIS — Z79890 Hormone replacement therapy (postmenopausal): Secondary | ICD-10-CM

## 2017-12-31 NOTE — Patient Instructions (Addendum)
Joanna Gilbert,  It was a pleasure to see you today. I am glad you are doing well. Please continue to take synthroid 100 mcg as previously prescribed for now. I will call you with the results of your lab work tomorrow and make adjustments over the phone if necessary.   Please make an appointment for your back back and arm pain at your convenience. Please make the appointment for the afternoon of 7th, 8th, or 13th for your shoulder injection   If you have any questions or concerns, call our clinic at [phone] or after hours call [phone] and ask for the internal medicine resident on call. Thank you!  - Dr. Philipp Ovens

## 2017-12-31 NOTE — Progress Notes (Signed)
   CC: follow up of hyperthyroidism  HPI:  [name] is a 67 y.o. female with past medical history outlined below here for follow up of hyperthyroidism. For the details of today's visit, please refer to the assessment and plan.  Past Medical History:  Diagnosis Date  . Anxiety   . Depression 02/21/2011  . Diverticulosis   . Fibroid uterus 05/06/2006   Nov 2011: total abdominal hysterectomy with b/l salpingoopherectomy by Dr. Mora Bellman for symptomatic fibroids.  Qualifier: Diagnosis of  By: [name] MD, [name]    . History of colonic polyps   . Hyperlipidemia   . Hypertension   . Hypothyroidism   . Osteoarthritis 02/21/2011   MR b/l Hips (2009): Mild to moderate bilateral hip osteoarthritis, more notable on the right, with likely associated small area of anterior acetabular labral tearing.  MR R knee (2009): Dominant finding is marked interval progression in lateral compartment degenerative disease with new tearing in the posterior horn of the lateral meniscus. Persistent perimeniscal cyst formation related to lateral meniscal tearing as describe above MR Lumbar spine (2009) : 1. Dominant finding is advanced facet degenerative change L4-5 with small facet joint effusions and bony edema about the joints. This could be a source of back pain.  2. Negative for notable central canal and foraminal stenosis in patient with a somewhat congenitally narrowed central canal.    . Plantar fasciitis, bilateral   . Prediabetes   . Seasonal allergies     Review of Systems  Cardiovascular: Negative for chest pain and palpitations.  Gastrointestinal: Negative for constipation.  Psychiatric/Behavioral: Negative for depression.     Physical Exam:  Vitals:   12/31/17 1437 12/31/17 1502  BP: (!) 166/63 (!) 147/80  Pulse: 85 79  Temp: 99.5 F (37.5 C)   TempSrc: Oral   SpO2: 96%   Weight: 240 lb (108.9 kg)   Height: 5\' 3"  (1.6 m)     Constitutional: NAD, appears  comfortable Cardiovascular: RRR, no murmurs, rubs, or gallops.  Pulmonary/Chest: CTAB, no wheezes, rales, or rhonchi.  Extremities: Warm and well perfused. No edema.  Psychiatric: Normal mood and affect  Assessment & Plan:   See Encounters Tab for problem based charting.  Patient discussed with Dr. Evette Doffing

## 2018-01-01 LAB — TSH: TSH: 2.87 u[IU]/mL (ref 0.450–4.500)

## 2018-01-02 ENCOUNTER — Encounter: Payer: Self-pay | Admitting: Internal Medicine

## 2018-01-02 ENCOUNTER — Encounter: Payer: Self-pay | Admitting: Student in an Organized Health Care Education/Training Program

## 2018-01-02 DIAGNOSIS — R030 Elevated blood-pressure reading, without diagnosis of hypertension: Secondary | ICD-10-CM | POA: Insufficient documentation

## 2018-01-02 DIAGNOSIS — I10 Essential (primary) hypertension: Secondary | ICD-10-CM | POA: Insufficient documentation

## 2018-01-02 NOTE — Assessment & Plan Note (Signed)
Patient is here for follow up of her hypothyroidism. She has been on Synthroid 100 mcg daily now for years. Her last TSH check one year ago was within normal limits. She denies fatigue, weakness, constipation, and depression.  -- Recheck TSH -- Continue synthroid 100 mcg daily pending results; patient will need refills

## 2018-01-02 NOTE — Assessment & Plan Note (Signed)
Blood pressure is elevated today, 166/63 and 147/80 on recheck. She does not take antihypertensives and has no prior history of hypertension. She is asymptomatic. -- Recheck 2 weeks

## 2018-01-03 NOTE — Progress Notes (Signed)
Internal Medicine Clinic Attending  Case discussed with Dr. Guilloud at the time of the visit.  We reviewed the resident's history and exam and pertinent patient test results.  I agree with the assessment, diagnosis, and plan of care documented in the resident's note.  

## 2018-01-07 ENCOUNTER — Ambulatory Visit (INDEPENDENT_AMBULATORY_CARE_PROVIDER_SITE_OTHER): Payer: Self-pay | Admitting: Internal Medicine

## 2018-01-07 ENCOUNTER — Other Ambulatory Visit: Payer: Self-pay

## 2018-01-07 ENCOUNTER — Encounter: Payer: Self-pay | Admitting: Internal Medicine

## 2018-01-07 VITALS — BP 142/49 | HR 72 | Temp 98.8°F | Wt 239.0 lb

## 2018-01-07 DIAGNOSIS — M67911 Unspecified disorder of synovium and tendon, right shoulder: Secondary | ICD-10-CM

## 2018-01-07 DIAGNOSIS — F419 Anxiety disorder, unspecified: Secondary | ICD-10-CM

## 2018-01-07 DIAGNOSIS — E039 Hypothyroidism, unspecified: Secondary | ICD-10-CM

## 2018-01-07 DIAGNOSIS — M75101 Unspecified rotator cuff tear or rupture of right shoulder, not specified as traumatic: Secondary | ICD-10-CM

## 2018-01-07 DIAGNOSIS — M67912 Unspecified disorder of synovium and tendon, left shoulder: Principal | ICD-10-CM

## 2018-01-07 DIAGNOSIS — E785 Hyperlipidemia, unspecified: Secondary | ICD-10-CM

## 2018-01-07 DIAGNOSIS — M1990 Unspecified osteoarthritis, unspecified site: Secondary | ICD-10-CM

## 2018-01-07 DIAGNOSIS — R7303 Prediabetes: Secondary | ICD-10-CM

## 2018-01-07 DIAGNOSIS — F329 Major depressive disorder, single episode, unspecified: Secondary | ICD-10-CM

## 2018-01-07 NOTE — Progress Notes (Signed)
CC: Right shoulder pain   HPI:  Joanna Gilbert is a 67 y.o. female with hypothyroidism, osteoarthritis, depression with anxiety, hyperlipidemia, prediabetes who presents for right shoulder pain. Please see problem based charting for evaluation, assessment, and plan.  Past Medical History:  Diagnosis Date  . Anxiety   . Depression 02/21/2011  . Diverticulosis   . Fibroid uterus 05/06/2006   Nov 2011: total abdominal hysterectomy with b/l salpingoopherectomy by Dr. Mora Bellman for symptomatic fibroids.  Qualifier: Diagnosis of  By: [name] MD, [name]    . History of colonic polyps   . Hyperlipidemia   . Hypertension   . Hypothyroidism   . Osteoarthritis 02/21/2011   MR b/l Hips (2009): Mild to moderate bilateral hip osteoarthritis, more notable on the right, with likely associated small area of anterior acetabular labral tearing.  MR R knee (2009): Dominant finding is marked interval progression in lateral compartment degenerative disease with new tearing in the posterior horn of the lateral meniscus. Persistent perimeniscal cyst formation related to lateral meniscal tearing as describe above MR Lumbar spine (2009) : 1. Dominant finding is advanced facet degenerative change L4-5 with small facet joint effusions and bony edema about the joints. This could be a source of back pain.  2. Negative for notable central canal and foraminal stenosis in patient with a somewhat congenitally narrowed central canal.    . Plantar fasciitis, bilateral   . Prediabetes   . Seasonal allergies    Review of Systems:    Right shoulder pain   Physical Exam:  Vitals:   01/07/18 1525  BP: (!) 142/49  Pulse: 72  Temp: 98.8 F (37.1 C)  SpO2: 98%  Weight: 239 lb (108.4 kg)   Physical Exam  Constitutional: She appears well-developed and well-nourished. No distress.  HENT:  Head: Normocephalic and atraumatic.  Eyes: Conjunctivae are normal.  Cardiovascular: Normal rate, regular rhythm and normal  heart sounds.  Respiratory: Effort normal and breath sounds normal. No respiratory distress. She has no wheezes.  GI: Soft. Bowel sounds are normal. She exhibits no distension. There is no tenderness.  Musculoskeletal: She exhibits no edema.  Positive neers, empty can, and kennedy test. Muscle strength 5/5 and sensation intact in bilateral upper extremities  Neurological: She is alert.  Skin: She is not diaphoretic. No erythema.  Psychiatric: She has a normal mood and affect. Her behavior is normal. Judgment and thought content normal.   Shoulder Injection Procedure Note  Pre-operative Diagnosis: right shoulder  Indications: Symptom relief from rotator cuff tendinopathy  Anesthesia: Lidocaine 1% without epinephrine without added sodium bicarbonate  Procedure Details   Point of care ultrasound was used to identify the subacromial bursa and evaluate for rotator cuff tendinopathy or tears. Consent was obtained for the procedure. The shoulder was prepped with iodine. Using a 22 gauge needle the subacromial bursa was injected with 1 mL 1% lidocaine and 2 mL of triamcinolone (KENALOG) 40mg /ml. The needle was removed and a dressing was applied.  Complications:  None; patient tolerated the procedure well.   Assessment & Plan:   See Encounters Tab for problem based charting.  Right rotator cuff tendinopathy  The patient presented with right shoulder pain that she rates as 7/10 intensity, achy in nature, and worsening with above the shoulder movement. The patient states that she has been having pain in her bilateral shoulders for the past 3 years now.   The patient got a arthrocentesis and steroid injection in her subacromial bursa on her left side  in March 2018 which she stated that she had a large amount of relief from for 6 months.   Assessment and plan  The patient is right hand dominant and she states that the pain is function limiting for her. She has been unable to sleep on her right  side and has had difficulty raising her right hand above her shoulder.   The patient likely has a right shoulder nerve impingement per her positive symptoms noted on provocation testing (neers, kennedy, and empty can). POCUS was used to examine the right shoulder and inject triamcinolone into her subacromial region. No significant amount of effusions were visualized on ultrasound.  -Patient was told to continue physical therapy exercises -Use NSAIDS as needed   Patient seen with Dr. Evette Doffing

## 2018-01-07 NOTE — Patient Instructions (Signed)
It was a pleasure to see you today Ms. Kainz. Please make the following changes:  -Please use ibuprofen 200mg  for your shoulder pain as needed   If you have any questions or concerns, please call our clinic at [phone] between 9am-5pm and after hours call [phone] and ask for the internal medicine resident on call. If you feel you are having a medical emergency please call 911.   Thank you, we look forward to help you remain healthy!  Lars Mage, MD Internal Medicine PGY2

## 2018-01-09 ENCOUNTER — Telehealth: Payer: Self-pay | Admitting: Internal Medicine

## 2018-01-09 ENCOUNTER — Encounter: Payer: Self-pay | Admitting: Internal Medicine

## 2018-01-09 DIAGNOSIS — E038 Other specified hypothyroidism: Secondary | ICD-10-CM

## 2018-01-09 NOTE — Progress Notes (Signed)
Called patient to see if she has relief from the steroid injection. Was not able reach her. Will try again.   Lars Mage, MD Internal Medicine PGY2 MDYJW:[phone] 01/09/2018, 9:10 AM

## 2018-01-09 NOTE — Telephone Encounter (Signed)
Pt is calling back, pt contact# [phone]

## 2018-01-09 NOTE — Progress Notes (Signed)
Internal Medicine Clinic Attending  I saw and evaluated the patient.  I personally confirmed the key portions of the history and exam documented by Dr. Maricela Bo and I reviewed pertinent patient test results.  The assessment, diagnosis, and plan were formulated together and I agree with the documentation in the resident's note. I was present for the entirety of the procedure.

## 2018-03-10 ENCOUNTER — Encounter: Payer: Self-pay | Admitting: Internal Medicine

## 2018-07-11 ENCOUNTER — Other Ambulatory Visit: Payer: Self-pay | Admitting: Internal Medicine

## 2018-07-11 DIAGNOSIS — E038 Other specified hypothyroidism: Secondary | ICD-10-CM

## 2018-07-11 NOTE — Telephone Encounter (Signed)
Please have patient schedule a follow up appointment with me in the next 1-2 months. Thanks.

## 2018-08-18 ENCOUNTER — Encounter: Payer: Self-pay | Admitting: Internal Medicine

## 2018-09-19 ENCOUNTER — Other Ambulatory Visit: Payer: Self-pay | Admitting: Internal Medicine

## 2018-09-19 DIAGNOSIS — E038 Other specified hypothyroidism: Secondary | ICD-10-CM

## 2018-11-17 ENCOUNTER — Encounter: Payer: Self-pay | Admitting: Internal Medicine

## 2019-01-07 ENCOUNTER — Other Ambulatory Visit: Payer: Self-pay | Admitting: Internal Medicine

## 2019-01-07 DIAGNOSIS — E038 Other specified hypothyroidism: Secondary | ICD-10-CM

## 2019-03-29 ENCOUNTER — Other Ambulatory Visit: Payer: Self-pay | Admitting: Internal Medicine

## 2019-03-29 DIAGNOSIS — E038 Other specified hypothyroidism: Secondary | ICD-10-CM

## 2020-04-12 ENCOUNTER — Telehealth: Payer: Self-pay | Admitting: Internal Medicine

## 2020-04-12 ENCOUNTER — Encounter: Payer: Self-pay | Admitting: Internal Medicine

## 2020-04-12 DIAGNOSIS — E038 Other specified hypothyroidism: Secondary | ICD-10-CM

## 2020-04-12 MED ORDER — LEVOTHYROXINE SODIUM 100 MCG PO TABS: 100.0000 ug | ORAL_TABLET | Freq: Every day | ORAL | 0 refills | Status: DC

## 2020-04-12 NOTE — Telephone Encounter (Signed)
Accidentally approved refill for levothyroxine 90 days with refills. Please call pharmacy and cancel this. I have sent a second prescription for 30 days. I have not seen patient in 2 years. Please have her schedule follow up. Thank you! Pharmacy is [facility] [address].

## 2020-04-12 NOTE — Addendum Note (Signed)
Addended by: Jodean Lima on: 04/12/2020 10:06 AM   Modules accepted: Orders

## 2020-04-12 NOTE — Telephone Encounter (Signed)
Ok then please cancel both prescriptions. Thank you.

## 2020-04-12 NOTE — Telephone Encounter (Signed)
Attempted to contact patient to see if she wanted to schedule an appointment since her last office visit was August 2019 or has she transferred her care.  No answer left detailed message asking patient give the clinic a call back.  Also going to send patient a letter.

## 2020-04-12 NOTE — Telephone Encounter (Signed)
Rx for 90 days with refill cancelled with [facility] at [facility]. She will fill Rx for 30 days. She will also advise patient to schedule appt with PCP. Hubbard Hartshorn, BSN, RN-BC

## 2020-04-20 ENCOUNTER — Other Ambulatory Visit: Payer: Self-pay

## 2020-04-20 ENCOUNTER — Ambulatory Visit (INDEPENDENT_AMBULATORY_CARE_PROVIDER_SITE_OTHER): Payer: Medicare Other | Admitting: Internal Medicine

## 2020-04-20 ENCOUNTER — Encounter: Payer: Self-pay | Admitting: Internal Medicine

## 2020-04-20 VITALS — BP 177/74 | HR 84 | Temp 98.9°F | Ht 62.0 in | Wt 255.0 lb

## 2020-04-20 DIAGNOSIS — E119 Type 2 diabetes mellitus without complications: Secondary | ICD-10-CM | POA: Diagnosis not present

## 2020-04-20 DIAGNOSIS — G8929 Other chronic pain: Secondary | ICD-10-CM | POA: Diagnosis not present

## 2020-04-20 DIAGNOSIS — N920 Excessive and frequent menstruation with regular cycle: Secondary | ICD-10-CM | POA: Insufficient documentation

## 2020-04-20 DIAGNOSIS — R011 Cardiac murmur, unspecified: Secondary | ICD-10-CM | POA: Diagnosis not present

## 2020-04-20 DIAGNOSIS — E785 Hyperlipidemia, unspecified: Secondary | ICD-10-CM | POA: Diagnosis not present

## 2020-04-20 DIAGNOSIS — M545 Low back pain, unspecified: Secondary | ICD-10-CM

## 2020-04-20 DIAGNOSIS — N921 Excessive and frequent menstruation with irregular cycle: Secondary | ICD-10-CM

## 2020-04-20 DIAGNOSIS — I10 Essential (primary) hypertension: Secondary | ICD-10-CM | POA: Diagnosis not present

## 2020-04-20 DIAGNOSIS — E038 Other specified hypothyroidism: Secondary | ICD-10-CM

## 2020-04-20 DIAGNOSIS — R7303 Prediabetes: Secondary | ICD-10-CM

## 2020-04-20 LAB — POCT GLYCOSYLATED HEMOGLOBIN (HGB A1C): Hemoglobin A1C: 6.9 % — AB (ref 4.0–5.6)

## 2020-04-20 LAB — GLUCOSE, CAPILLARY: Glucose-Capillary: 145 mg/dL — ABNORMAL HIGH (ref 70–99)

## 2020-04-20 NOTE — Patient Instructions (Signed)
Ms. Harnden,  It was a pleasure to see you. I will call you with the results of your blood work next week. Once I get the results, I will sent a prescription to your pharmacy for your blood pressure. Please follow up with me again in 4-6 weeks for follow up.   I have also ordered a CT of your spine. You will be called to schedule this.   If you have any questions or concerns, call our clinic at [phone] or after hours call [phone] and ask for the internal medicine resident on call. Thank you!  Dr. Philipp Ovens

## 2020-04-21 LAB — LIPID PANEL
Chol/HDL Ratio: 3.7 ratio (ref 0.0–4.4)
Cholesterol, Total: 238 mg/dL — ABNORMAL HIGH (ref 100–199)
HDL: 64 mg/dL (ref >39)
LDL Chol Calc (NIH): 158 mg/dL — ABNORMAL HIGH (ref 0–99)
Triglycerides: 94 mg/dL (ref 0–149)
VLDL Cholesterol Cal: 16 mg/dL (ref 5–40)

## 2020-04-21 LAB — BMP8+ANION GAP
Anion Gap: 15 mmol/L (ref 10.0–18.0)
BUN/Creatinine Ratio: 11 — ABNORMAL LOW (ref 12–28)
BUN: 8 mg/dL (ref 8–27)
CO2: 23 mmol/L (ref 20–29)
Calcium: 9.9 mg/dL (ref 8.7–10.3)
Chloride: 102 mmol/L (ref 96–106)
Creatinine, Ser: 0.71 mg/dL (ref 0.57–1.00)
GFR calc Af Amer: 102 mL/min/{1.73_m2} (ref >59)
GFR calc non Af Amer: 88 mL/min/{1.73_m2} (ref >59)
Glucose: 140 mg/dL — ABNORMAL HIGH (ref 65–99)
Potassium: 4.1 mmol/L (ref 3.5–5.2)
Sodium: 140 mmol/L (ref 134–144)

## 2020-04-21 LAB — TSH: TSH: 1.35 u[IU]/mL (ref 0.450–4.500)

## 2020-04-25 ENCOUNTER — Encounter: Payer: Self-pay | Admitting: Internal Medicine

## 2020-04-25 DIAGNOSIS — R011 Cardiac murmur, unspecified: Secondary | ICD-10-CM | POA: Insufficient documentation

## 2020-04-25 MED ORDER — ATORVASTATIN CALCIUM 40 MG PO TABS: 40.0000 mg | ORAL_TABLET | Freq: Every day | ORAL | 0 refills | Status: DC

## 2020-04-25 MED ORDER — LOSARTAN POTASSIUM-HCTZ 50-12.5 MG PO TABS: 1.0000 | ORAL_TABLET | Freq: Every day | ORAL | 0 refills | Status: DC

## 2020-04-25 MED ORDER — LEVOTHYROXINE SODIUM 100 MCG PO TABS: 100.0000 ug | ORAL_TABLET | Freq: Every day | ORAL | 3 refills | Status: DC

## 2020-04-25 NOTE — Assessment & Plan Note (Addendum)
Patient was seen two years ago and noted to have elevated BP with no prior history of HTN. Plan was for follow up in 2 weeks with plans to start antihypertensive therapy if persistently elevated. Unfortunately patient did not follow up. Today her BP is severely elevated, 177/74. She is asymptomatic. BMP today shows normal renal function. Plan to start combination losartan-hctz 50-12.5 mg daily, follow up 1 month for BP recheck and repeat BMP.

## 2020-04-25 NOTE — Assessment & Plan Note (Signed)
Patient has a 3/6 systolic murmur at the right upper sternal border. She says she has had this murmur "her whole life". She is asymptomatic, no signs or symptoms of heart failure. No prior echos in our system. Discussed ordering echocardiogram for further evaluation, she prefers to wait until next visit since we are imaging her spine. Will follow up.

## 2020-04-25 NOTE — Assessment & Plan Note (Signed)
TSH today is within normal range. Continue Levothyroxine 100 mcg daily, refills sent to pharmacy.

## 2020-04-25 NOTE — Progress Notes (Signed)
Subjective:   Patient ID: Joanna Gilbert female   DOB: 12-01-1950 69 y.o.   MRN: [id number]  HPI: [name] is a 69 y.o. female with past medical history outlined below here for follow up of HTN. For the details of today's visit, please refer to the assessment and plan.   Past Medical History:  Diagnosis Date  . Anxiety   . Depression 02/21/2011  . Diverticulosis   . Fibroid uterus 05/06/2006   Nov 2011: total abdominal hysterectomy with b/l salpingoopherectomy by Dr. Mora Bellman for symptomatic fibroids.  Qualifier: Diagnosis of  By: [name] MD, [name]    . History of colonic polyps   . Hyperlipidemia   . Hypertension   . Hypothyroidism   . Osteoarthritis 02/21/2011   MR b/l Hips (2009): Mild to moderate bilateral hip osteoarthritis, more notable on the right, with likely associated small area of anterior acetabular labral tearing.  MR R knee (2009): Dominant finding is marked interval progression in lateral compartment degenerative disease with new tearing in the posterior horn of the lateral meniscus. Persistent perimeniscal cyst formation related to lateral meniscal tearing as describe above MR Lumbar spine (2009) : 1. Dominant finding is advanced facet degenerative change L4-5 with small facet joint effusions and bony edema about the joints. This could be a source of back pain.  2. Negative for notable central canal and foraminal stenosis in patient with a somewhat congenitally narrowed central canal.    . Plantar fasciitis, bilateral   . Prediabetes   . Seasonal allergies    Current Outpatient Medications  Medication Sig Dispense Refill  . levothyroxine (EUTHYROX) 100 MCG tablet Take 1 tablet (100 mcg total) by mouth daily before breakfast. 30 tablet 0  . loratadine (CLARITIN) 10 MG tablet Take 10 mg by mouth daily as needed for allergies. Reported on 08/12/2015    . meloxicam (MOBIC) 7.5 MG tablet TAKE ONE TABLET BY MOUTH ONCE DAILY 30 tablet 0   No current  facility-administered medications for this visit.   Family History  Problem Relation Age of Onset  . Alcohol abuse Mother   . Heart disease Mother 78       required CABG  . Hypothyroidism Father   . Arthritis Sister   . Hypertension Sister   . Colon cancer Neg Hx    Social History   Socioeconomic History  . Marital status: Married    Spouse name: Not on file  . Number of children: 2  . Years of education: college  . Highest education level: Not on file  Occupational History  . 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 Smoking status: Never Smoker  . Smokeless tobacco: Never Used  Substance and Sexual Activity  . Alcohol use: No    Alcohol/week: 0.0 standard drinks  . Drug use: No  . Sexual activity: Never  Other Topics Concern  . Not on file  Social History Narrative   From [place], has lived in [place] since 1993 and lives with her husband. Can read and write fluently in [place].   Social Determinants of Health   Financial Resource Strain:   . Difficulty of Paying Living Expenses: Not on file  Food Insecurity:   . Worried About Charity fundraiser in the Last Year: Not on file  . Ran Out of Food in the Last Year: Not on file  Transportation Needs:   . Lack of Transportation (Medical): Not on  file  . Lack of Transportation (Non-Medical): Not on file  Physical Activity:   . Days of Exercise per Week: Not on file  . Minutes of Exercise per Session: Not on file  Stress:   . Feeling of Stress : Not on file  Social Connections:   . Frequency of Communication with Friends and Family: Not on file  . Frequency of Social Gatherings with Friends and Family: Not on file  . Attends Religious Services: Not on file  . Active Member of Clubs or Organizations: Not on file  . Attends Archivist Meetings: Not on file  . Marital Status: Not on file    Review of Systems: Review of Systems  Constitutional: Negative for  chills and fever.  Respiratory: Negative for shortness of breath.   Cardiovascular: Negative for chest pain.     Objective:  Physical Exam:  Vitals:   04/20/20 0952  BP: (!) 177/74  Pulse: 84  Temp: 98.9 F (37.2 C)  TempSrc: Oral  SpO2: 97%  Weight: 255 lb (115.7 kg)  Height: 5\' 2"  (1.575 m)    Physical Exam Constitutional:      Appearance: Normal appearance.  Cardiovascular:     Rate and Rhythm: Normal rate and regular rhythm.     Comments: 3/6 systolic murmur and the right upper sternal border  Pulmonary:     Effort: Pulmonary effort is normal. No respiratory distress.     Breath sounds: Normal breath sounds.  Musculoskeletal:     Right lower leg: No edema.     Left lower leg: No edema.  Skin:    General: Skin is warm and dry.  Neurological:     Mental Status: She is alert.      Assessment & Plan:   See Encounters Tab for problem based charting.

## 2020-04-25 NOTE — Assessment & Plan Note (Signed)
Patient has a history of prediabetes. Repeat Hgb A1c today was elevated at 6.9%, placing her in the diabetic range. I discussed this new diagnosis with her and implication on her long term health. I have recommended dietary modification before starting medication. Plan to repeat Hgb A1c in 3 months.

## 2020-04-25 NOTE — Assessment & Plan Note (Signed)
Lipid panel checked today shows total cholesterol of 238 with LDL of 158. Her Hgb A1c was elevated today as well, 6.9. Based on today's visit, her 10-year ASCVD risk is 42%. Called patient with results. I have recommended high intensity statin for primary risk reduction. She is agreeable.  -- Atorvastatin 40 mg daily -- Repeat lipid panel at follow up

## 2020-04-25 NOTE — Assessment & Plan Note (Addendum)
Patient has chronic back pain with some radiculopathy to her bilateral legs. She brought with her a printed report of an MRI lumbar spine done in 2003, which is also available in our EMR. 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 This was never done. I am not sure her symptoms correlate with the location of her lesion. I have low suspicion for malignancy given that it has been 18 years with minimal progression of her symptoms. However will order follow up CT for completion of work up.

## 2020-05-02 ENCOUNTER — Telehealth: Payer: Self-pay | Admitting: Internal Medicine

## 2020-05-02 DIAGNOSIS — G8929 Other chronic pain: Secondary | ICD-10-CM

## 2020-05-02 DIAGNOSIS — M545 Low back pain, unspecified: Secondary | ICD-10-CM

## 2020-05-02 MED ORDER — DIAZEPAM 2 MG PO TABS: ORAL_TABLET | ORAL | 0 refills | Status: DC

## 2020-05-02 NOTE — Telephone Encounter (Signed)
Pt is requesting a call back. Pt notified of her CT Scheduled with [facility] on 05/17/2020.  Patient is requesting to have something (Medication) called in as she is nervous about going through the CT machine.  Please advise

## 2020-05-02 NOTE — Telephone Encounter (Signed)
Notified the patient to bring the medication with her to her appointment and Radiology will tell her when to take her medication at her appointment.  Also notified the patient to make sure she has a driver after her Ct as she will not be able to drive after taking her medication the day of.

## 2020-05-02 NOTE — Telephone Encounter (Signed)
Sent a prescription for Valium to her pharmacy (1 tablet). She should take this one hour prior to her CT scan. Please advise her not to drive on Valium.

## 2020-05-17 ENCOUNTER — Ambulatory Visit (HOSPITAL_COMMUNITY)
Admission: RE | Admit: 2020-05-17 | Discharge: 2020-05-17 | Disposition: A | Discharge status: Home / Self Care | Payer: Medicare Other | Source: Ambulatory Visit | Attending: Internal Medicine | Admitting: Internal Medicine

## 2020-05-17 ENCOUNTER — Other Ambulatory Visit: Payer: Self-pay | Admitting: Internal Medicine

## 2020-05-17 ENCOUNTER — Other Ambulatory Visit: Payer: Self-pay

## 2020-05-17 DIAGNOSIS — G8929 Other chronic pain: Secondary | ICD-10-CM | POA: Diagnosis present

## 2020-05-17 DIAGNOSIS — M545 Low back pain, unspecified: Secondary | ICD-10-CM | POA: Diagnosis not present

## 2020-05-18 NOTE — Progress Notes (Signed)
Called patient with CT results - benign hemangioma. No further work up.

## 2020-06-15 ENCOUNTER — Encounter: Payer: Medicaid Other | Admitting: Internal Medicine

## 2020-10-09 ENCOUNTER — Encounter: Payer: Self-pay | Admitting: *Deleted

## 2020-10-09 NOTE — Progress Notes (Signed)

## 2020-10-17 NOTE — Progress Notes (Signed)
Things That May Be Affecting Your Health:  Alcohol  Hearing loss  Pain    Depression  Home Safety  Sexual Health   Diabetes  Lack of physical activity  Stress   Difficulty with daily activities  Loneliness  Tiredness   Drug use  Medicines  Tobacco use   Falls  Motor Vehicle Safety  Weight   Food choices  Oral Health  Other    YOUR PERSONALIZED HEALTH PLAN : 1. Schedule your next subsequent Medicare Wellness visit in one year 2. Attend all of your regular appointments to address your medical issues 3. Complete the preventative screenings and services   Annual Wellness Visit   Medicare Covered Preventative Screenings and [place] Men and Women Who How Often Need? Date of Last Service Action  Abdominal Aortic Aneurysm Adults with AAA risk factors Once      Alcohol Misuse and Counseling All Adults Screening once a year if no alcohol misuse. Counseling up to 4 face to face sessions.     Bone Density Measurement  Adults at risk for osteoporosis Once every 2 yrs Yes  N/A  Please offer  Lipid Panel Z13.6 All adults without CV disease Once every 5 yrs       Colorectal Cancer   Stool sample or  Colonoscopy All adults 64 and older   Once every year  Every 10 years Yes       Depression All Adults Once a year  Today   Diabetes Screening Blood glucose, post glucose load, or GTT Z13.1  All adults at risk  Pre-diabetics  Once per year  Twice per year      Diabetes  Self-Management Training All adults Diabetics 10 hrs first year; 2 hours subsequent years. Requires Copay Yes    Please offer  Glaucoma  Diabetics  Family history of glaucoma  African Americans 59 yrs +  Hispanic Americans 62 yrs + Annually - requires coppay      Hepatitis C Z72.89 or F19.20  High Risk for HCV  Born between 1945 and 1965  Annually  Once      HIV Z11.4 All adults based on risk  Annually btw ages 58 & 72 regardless of risk  Annually > 65 yrs if at increased  risk      Lung Cancer Screening Asymptomatic adults aged 19-77 with 30 pack yr history and current smoker OR quit within the last 15 yrs Annually Must have counseling and shared decision making documentation before first screen      Medical Nutrition Therapy Adults with   Diabetes  Renal disease  Kidney transplant within past 3 yrs 3 hours first year; 2 hours subsequent years     Obesity and Counseling All adults Screening once a year Counseling if BMI 30 or higher  Today   Tobacco Use Counseling Adults who use tobacco  Up to 8 visits in one year     Vaccines Z23  Hepatitis B  Influenza   Pneumonia  Adults   Once  Once every flu season  Two different vaccines separated by one year Yes   Please give PPSV23       Next Annual Wellness Visit People with Medicare Every year  Today     Services & Screenings Women Who How Often Need  Date of Last Service Action  Mammogram  Z12.31 Women over 39 One baseline ages 51-39. Annually ager 23 yrs+ Yes 2014   Please order  Pap tests All women Annually  if high risk. Every 2 yrs for normal risk women      Screening for cervical cancer with   Pap (Z01.419 nl or Z01.411abnl) &  HPV Z11.51 Women aged 34 to 12 Once every 5 yrs     Screening pelvic and breast exams All women Annually if high risk. Every 2 yrs for normal risk women     Sexually Transmitted Diseases  Chlamydia  Gonorrhea  Syphilis All at risk adults Annually for non pregnant females at increased risk         [place] Men Who How Ofter Need  Date of Last Service Action  Prostate Cancer - DRE & PSA Men over 50 Annually.  DRE might require a copay.        Sexually Transmitted Diseases  Syphilis All at risk adults Annually for men at increased risk      Health Maintenance List Health Maintenance  Topic Date Due  . COVID-19 Vaccine (1) Never done  . FOOT EXAM  Never done  . OPHTHALMOLOGY EXAM  Never done  . MAMMOGRAM  10/18/2014  . COLON  CANCER SCREENING ANNUAL FOBT  08/10/2016  . DEXA SCAN  Never done  . PNA vac Low Risk Adult (1 of 2 - PCV13) Never done  . TETANUS/TDAP  07/27/2019  . HEMOGLOBIN A1C  10/18/2020  . INFLUENZA VACCINE  12/26/2020  . COLONOSCOPY (Pts 45-85yrs Insurance coverage will need to be confirmed)  08/23/2025  . Hepatitis C Screening  Completed  . HPV VACCINES  Aged Out

## 2020-11-25 DIAGNOSIS — M545 Low back pain, unspecified: Secondary | ICD-10-CM | POA: Diagnosis not present

## 2020-11-25 DIAGNOSIS — Z6841 Body Mass Index (BMI) 40.0 and over, adult: Secondary | ICD-10-CM | POA: Diagnosis not present

## 2020-11-25 DIAGNOSIS — E039 Hypothyroidism, unspecified: Secondary | ICD-10-CM | POA: Diagnosis not present

## 2020-11-25 DIAGNOSIS — I10 Essential (primary) hypertension: Secondary | ICD-10-CM | POA: Diagnosis not present

## 2020-11-25 DIAGNOSIS — Z1331 Encounter for screening for depression: Secondary | ICD-10-CM | POA: Diagnosis not present

## 2020-11-25 DIAGNOSIS — E1169 Type 2 diabetes mellitus with other specified complication: Secondary | ICD-10-CM | POA: Diagnosis not present

## 2020-11-25 DIAGNOSIS — M722 Plantar fascial fibromatosis: Secondary | ICD-10-CM | POA: Diagnosis not present

## 2020-12-07 ENCOUNTER — Telehealth: Payer: Self-pay

## 2020-12-07 NOTE — Telephone Encounter (Signed)
Spoke to patient and she decline to have mammogram done at this time.

## 2020-12-13 ENCOUNTER — Ambulatory Visit (INDEPENDENT_AMBULATORY_CARE_PROVIDER_SITE_OTHER): Payer: Medicare HMO

## 2020-12-13 ENCOUNTER — Ambulatory Visit (INDEPENDENT_AMBULATORY_CARE_PROVIDER_SITE_OTHER): Payer: Medicare HMO | Admitting: Podiatry

## 2020-12-13 ENCOUNTER — Other Ambulatory Visit: Payer: Self-pay

## 2020-12-13 DIAGNOSIS — M79671 Pain in right foot: Secondary | ICD-10-CM

## 2020-12-13 DIAGNOSIS — M722 Plantar fascial fibromatosis: Secondary | ICD-10-CM

## 2020-12-13 NOTE — Patient Instructions (Signed)

## 2020-12-16 NOTE — Progress Notes (Signed)
  Subjective:  Patient ID: Joanna Gilbert, female    DOB: [birth year],  MRN: [id number]  Chief Complaint  Patient presents with   Foot Pain    np-right heel pain x5 years -dr. Mickel Baas wile refer    70 y.o. female presents with the above complaint. History confirmed with patient.  Been going on for many years.  Has not resolved despite physical therapy and anti-inflammatories.  She also has shooting pain down the right hip as well with sciatica and she is seeing a back doctor for this.  Objective:  Physical Exam: warm, good capillary refill, no trophic changes or ulcerative lesions, normal DP and PT pulses, and normal sensory exam.  Right Foot: Sharp pain on palpation of the plantar fascia and pain with radiation into the toes   Radiographs: Multiple views x-ray of the right foot: no fracture, dislocation, swelling or degenerative changes noted and plantar calcaneal spur Assessment:   1. Plantar fasciitis of right foot      Plan:  Patient was evaluated and treated and all questions answered.  Discussed the etiology and treatment options for plantar fasciitis including stretching, formal physical therapy, supportive shoegears such as a running shoe or sneaker, pre fabricated orthoses, injection therapy, and oral medications. We also discussed the role of surgical treatment of this for patients who do not improve after exhausting non-surgical treatment options.   -XR reviewed with patient -Educated patient on stretching and icing of the affected limb -She been doing this for many years on and off and would like a more permanent solution.  I am ordering MRI to evaluate for possible surgical planning to treat her plantar fasciitis that has failed multiple treatments for many years.   Return in about 4 weeks (around 01/10/2021) for after MRI to review, recheck plantar fasciitis.

## 2020-12-22 DIAGNOSIS — M48062 Spinal stenosis, lumbar region with neurogenic claudication: Secondary | ICD-10-CM | POA: Diagnosis not present

## 2020-12-26 ENCOUNTER — Ambulatory Visit
Admission: RE | Admit: 2020-12-26 | Discharge: 2020-12-26 | Disposition: A | Discharge status: Home / Self Care | Payer: Medicare HMO | Source: Ambulatory Visit | Attending: Podiatry | Admitting: Podiatry

## 2020-12-26 DIAGNOSIS — M79671 Pain in right foot: Secondary | ICD-10-CM

## 2020-12-26 DIAGNOSIS — M65871 Other synovitis and tenosynovitis, right ankle and foot: Secondary | ICD-10-CM | POA: Diagnosis not present

## 2020-12-26 DIAGNOSIS — M722 Plantar fascial fibromatosis: Secondary | ICD-10-CM

## 2020-12-26 DIAGNOSIS — G8929 Other chronic pain: Secondary | ICD-10-CM | POA: Diagnosis not present

## 2020-12-26 DIAGNOSIS — M19071 Primary osteoarthritis, right ankle and foot: Secondary | ICD-10-CM | POA: Diagnosis not present

## 2021-01-10 ENCOUNTER — Ambulatory Visit (INDEPENDENT_AMBULATORY_CARE_PROVIDER_SITE_OTHER): Payer: Medicare HMO | Admitting: Podiatry

## 2021-01-10 ENCOUNTER — Other Ambulatory Visit: Payer: Self-pay

## 2021-01-10 DIAGNOSIS — M2142 Flat foot [pes planus] (acquired), left foot: Secondary | ICD-10-CM

## 2021-01-10 DIAGNOSIS — M2141 Flat foot [pes planus] (acquired), right foot: Secondary | ICD-10-CM | POA: Diagnosis not present

## 2021-01-10 DIAGNOSIS — E1169 Type 2 diabetes mellitus with other specified complication: Secondary | ICD-10-CM | POA: Insufficient documentation

## 2021-01-10 DIAGNOSIS — E119 Type 2 diabetes mellitus without complications: Secondary | ICD-10-CM | POA: Insufficient documentation

## 2021-01-10 DIAGNOSIS — M5417 Radiculopathy, lumbosacral region: Secondary | ICD-10-CM

## 2021-01-10 NOTE — Progress Notes (Signed)
  Subjective:  Patient ID: Joanna Gilbert, female    DOB: 1950-12-08,  MRN: [id number]  Chief Complaint  Patient presents with   Plantar Fasciitis      Follow up after MRI to review, recheck plantar fasciitis right    70 y.o. female presents with the above complaint. History confirmed with patient.  Feels about the same she completed the MRI  Objective:  Physical Exam: warm, good capillary refill, no trophic changes or ulcerative lesions, normal DP and PT pulses, and normal sensory exam.  Right Foot: Sharp pain on palpation of the plantar and medial heel with radiation into the toes   Radiographs: Multiple views x-ray of the right foot: no fracture, dislocation, swelling or degenerative changes noted and plantar calcaneal spur  MRI completed shows no abnormality within the foot or ankle I reviewed the images personally Assessment:   1. Lumbosacral radiculopathy   2. Type 2 diabetes mellitus with other specified complication, without long-term current use of insulin ([place])   3. Pes planus of both feet       Plan:  Patient was evaluated and treated and all questions answered.  I reviewed the MRI findings with her.  I do not think that this is plantar fasciitis or posterior tibial tenderness rather likely lumbosacral radiculopathy.  I have referred her to neurosurgeon for this for evaluation.  Also think she would benefit from support and alignment with diabetic shoes with custom molded insoles which we will have her fitted for   Return in about 2 months (around 03/12/2021).

## 2021-01-23 DIAGNOSIS — M5441 Lumbago with sciatica, right side: Secondary | ICD-10-CM | POA: Diagnosis not present

## 2021-01-23 DIAGNOSIS — G8929 Other chronic pain: Secondary | ICD-10-CM | POA: Diagnosis not present

## 2021-01-23 DIAGNOSIS — M5442 Lumbago with sciatica, left side: Secondary | ICD-10-CM | POA: Diagnosis not present

## 2021-03-14 ENCOUNTER — Other Ambulatory Visit: Payer: Self-pay

## 2021-03-14 ENCOUNTER — Ambulatory Visit (INDEPENDENT_AMBULATORY_CARE_PROVIDER_SITE_OTHER): Payer: Medicare HMO | Admitting: Podiatry

## 2021-03-14 DIAGNOSIS — M2142 Flat foot [pes planus] (acquired), left foot: Secondary | ICD-10-CM

## 2021-03-14 DIAGNOSIS — M2141 Flat foot [pes planus] (acquired), right foot: Secondary | ICD-10-CM | POA: Diagnosis not present

## 2021-03-14 DIAGNOSIS — E1169 Type 2 diabetes mellitus with other specified complication: Secondary | ICD-10-CM | POA: Diagnosis not present

## 2021-03-14 DIAGNOSIS — M5417 Radiculopathy, lumbosacral region: Secondary | ICD-10-CM | POA: Diagnosis not present

## 2021-03-15 NOTE — Progress Notes (Signed)
  Subjective:  Patient ID: Joanna Gilbert, female    DOB: May 22, 1951,  MRN: [id number]  Chief Complaint  Patient presents with   Flat Foot    2 month follow up    70 y.o. female presents with the above complaint. History confirmed with patient.  She went to see the back doctor but did not have a good experience.  The back is hurting worse and the pain is radiating down to the thigh and into the feet  Objective:  Physical Exam: warm, good capillary refill, no trophic changes or ulcerative lesions, normal DP and PT pulses, and normal sensory exam.  Right Foot: Sharp pain on palpation of the plantar and medial heel with radiation into the toes   Radiographs: Multiple views x-ray of the right foot: no fracture, dislocation, swelling or degenerative changes noted and plantar calcaneal spur  MRI completed shows no abnormality within the foot or ankle I reviewed the images personally Assessment:   1. Lumbosacral radiculopathy   2. Type 2 diabetes mellitus with other specified complication, without long-term current use of insulin ([place])   3. Pes planus of both feet       Plan:  Patient was evaluated and treated and all questions answered.  Encouraged her to return to [facility] and discuss further options with them they had discussed possibly an MRI.  This would be good at least for diagnostic purposes if not to also guide further treatment.  Discussed with her that if she is still not happy with what she hears from them to seek a second opinion.  She will return to see me as needed at this point.  She will be scheduled for diabetic shoes in the new year   No follow-ups on file.

## 2021-03-17 ENCOUNTER — Other Ambulatory Visit: Payer: Self-pay | Admitting: Neurosurgery

## 2021-03-17 DIAGNOSIS — M5441 Lumbago with sciatica, right side: Secondary | ICD-10-CM

## 2021-04-15 ENCOUNTER — Ambulatory Visit
Admission: RE | Admit: 2021-04-15 | Discharge: 2021-04-15 | Disposition: A | Discharge status: Home / Self Care | Payer: Medicare HMO | Source: Ambulatory Visit | Attending: Neurosurgery | Admitting: Neurosurgery

## 2021-04-15 ENCOUNTER — Other Ambulatory Visit: Payer: Self-pay

## 2021-04-15 DIAGNOSIS — M4316 Spondylolisthesis, lumbar region: Secondary | ICD-10-CM | POA: Diagnosis not present

## 2021-04-15 DIAGNOSIS — M545 Low back pain, unspecified: Secondary | ICD-10-CM | POA: Diagnosis not present

## 2021-04-15 DIAGNOSIS — M5441 Lumbago with sciatica, right side: Secondary | ICD-10-CM

## 2021-04-15 DIAGNOSIS — R200 Anesthesia of skin: Secondary | ICD-10-CM | POA: Diagnosis not present

## 2021-04-15 DIAGNOSIS — M48061 Spinal stenosis, lumbar region without neurogenic claudication: Secondary | ICD-10-CM | POA: Diagnosis not present

## 2021-04-15 DIAGNOSIS — M4807 Spinal stenosis, lumbosacral region: Secondary | ICD-10-CM | POA: Diagnosis not present

## 2021-04-15 DIAGNOSIS — M47816 Spondylosis without myelopathy or radiculopathy, lumbar region: Secondary | ICD-10-CM | POA: Diagnosis not present

## 2021-04-26 ENCOUNTER — Other Ambulatory Visit: Payer: Self-pay | Admitting: Internal Medicine

## 2021-04-26 DIAGNOSIS — E038 Other specified hypothyroidism: Secondary | ICD-10-CM

## 2021-04-26 NOTE — Telephone Encounter (Signed)
Approved 90 day refill until she can be seen. Thanks.

## 2021-05-04 DIAGNOSIS — R030 Elevated blood-pressure reading, without diagnosis of hypertension: Secondary | ICD-10-CM | POA: Diagnosis not present

## 2021-05-04 DIAGNOSIS — M5441 Lumbago with sciatica, right side: Secondary | ICD-10-CM | POA: Diagnosis not present

## 2021-05-04 DIAGNOSIS — Z6841 Body Mass Index (BMI) 40.0 and over, adult: Secondary | ICD-10-CM | POA: Diagnosis not present

## 2021-05-04 DIAGNOSIS — M4316 Spondylolisthesis, lumbar region: Secondary | ICD-10-CM | POA: Diagnosis not present

## 2021-05-04 DIAGNOSIS — M48062 Spinal stenosis, lumbar region with neurogenic claudication: Secondary | ICD-10-CM | POA: Diagnosis not present

## 2021-05-11 DIAGNOSIS — R030 Elevated blood-pressure reading, without diagnosis of hypertension: Secondary | ICD-10-CM | POA: Diagnosis not present

## 2021-05-11 DIAGNOSIS — M4316 Spondylolisthesis, lumbar region: Secondary | ICD-10-CM | POA: Diagnosis not present

## 2021-05-11 DIAGNOSIS — Z6841 Body Mass Index (BMI) 40.0 and over, adult: Secondary | ICD-10-CM | POA: Diagnosis not present

## 2021-05-11 DIAGNOSIS — M48062 Spinal stenosis, lumbar region with neurogenic claudication: Secondary | ICD-10-CM | POA: Diagnosis not present

## 2021-05-24 DIAGNOSIS — M48062 Spinal stenosis, lumbar region with neurogenic claudication: Secondary | ICD-10-CM | POA: Diagnosis not present

## 2021-06-02 ENCOUNTER — Ambulatory Visit: Payer: Medicare HMO

## 2021-06-07 ENCOUNTER — Other Ambulatory Visit: Payer: Self-pay

## 2021-06-07 ENCOUNTER — Ambulatory Visit: Payer: Medicare HMO

## 2021-06-07 DIAGNOSIS — M2141 Flat foot [pes planus] (acquired), right foot: Secondary | ICD-10-CM

## 2021-06-07 DIAGNOSIS — E1169 Type 2 diabetes mellitus with other specified complication: Secondary | ICD-10-CM

## 2021-06-07 NOTE — Progress Notes (Signed)
SITUATION Reason for Consult: Evaluation for Prefabricated Diabetic Shoes and Bilateral Custom Diabetic Inserts. Patient / Caregiver Report: Patient would like well fitting shoes  OBJECTIVE DATA: Patient History / Diagnosis:    ICD-10-CM   1. Type 2 diabetes mellitus with other specified complication, without long-term current use of insulin ([facility])  E11.69     2. Pes planus of both feet  M21.41    M21.42       Current or Previous Devices:   None and no history  In-Person Foot Examination: Ulcers & Callousing:   None and no history  Shoe Size: 9.5W  ORTHOTIC RECOMMENDATION Recommended Devices: - 1x pair prefabricated PDAC approved diabetic shoes: [id number] 9.5W - 3x pair custom-to-patient vacuum formed diabetic insoles.   GOALS OF SHOES AND INSOLES - Reduce shear and pressure - Reduce / Prevent callus formation - Reduce / Prevent ulceration - Protect the fragile healing compromised diabetic foot.  Patient would benefit from diabetic shoes and inserts as patient has diabetes mellitus and the patient has one or more of the following conditions: - History of partial or complete amputation of the foot - History of previous foot ulceration. - History of pre-ulcerative callus - Peripheral neuropathy with evidence of callus formation - Foot deformity - Poor circulation  ACTIONS PERFORMED Patient was casted for insoles via crush box and measured for shoes via brannock device. Procedure was explained and patient tolerated procedure well. All questions were answered and concerns addressed.  PLAN Patient is to ensure treating physician receives and completes diabetic paperwork. Casts and shoe order are to be held until paperwork is received. Once received patient is to be scheduled for fitting in four weeks.

## 2021-06-21 ENCOUNTER — Encounter: Payer: Medicare HMO | Admitting: Internal Medicine

## 2021-07-28 ENCOUNTER — Other Ambulatory Visit: Payer: Self-pay | Admitting: Internal Medicine

## 2021-07-28 DIAGNOSIS — E038 Other specified hypothyroidism: Secondary | ICD-10-CM

## 2021-07-31 NOTE — Telephone Encounter (Signed)
Patient needs to schedule follow up before I will refill. Already received a 90 day supply three months ago. Thanks.  ?

## 2021-08-03 ENCOUNTER — Other Ambulatory Visit: Payer: Self-pay | Admitting: Internal Medicine

## 2021-08-03 DIAGNOSIS — E038 Other specified hypothyroidism: Secondary | ICD-10-CM

## 2021-08-03 NOTE — Telephone Encounter (Signed)
I approved a 30 day refill until she is seen. Thanks.  ?

## 2021-08-03 NOTE — Telephone Encounter (Signed)
Refill request denied until she can schedule. It's been over a year since she was seen and I already provided a 1 x 90 day supply. If she schedules I will send a short supply to last her until her appointment. Thanks.  ?

## 2021-08-03 NOTE — Telephone Encounter (Signed)
Patient called this morning and asked to make an appointment. ?

## 2021-08-03 NOTE — Telephone Encounter (Signed)
Attempted to contact patient to schedule future appointment with Dr. Philipp Ovens no answer.  Left detailed message asking patient to give [place] a call back. Will also send message via my chart stating the same. ?

## 2021-08-04 ENCOUNTER — Other Ambulatory Visit: Payer: Self-pay

## 2021-08-04 DIAGNOSIS — E038 Other specified hypothyroidism: Secondary | ICD-10-CM

## 2021-08-04 NOTE — Telephone Encounter (Signed)
Thank you. Refill declined because I filled it yesterday.  ?

## 2021-08-07 ENCOUNTER — Telehealth: Payer: Self-pay

## 2021-08-07 ENCOUNTER — Encounter: Payer: Self-pay | Admitting: Internal Medicine

## 2021-08-07 ENCOUNTER — Ambulatory Visit (INDEPENDENT_AMBULATORY_CARE_PROVIDER_SITE_OTHER): Payer: Medicare Other | Admitting: Internal Medicine

## 2021-08-07 VITALS — BP 149/77 | HR 76 | Temp 98.9°F | Ht 62.0 in | Wt 239.0 lb

## 2021-08-07 DIAGNOSIS — I10 Essential (primary) hypertension: Secondary | ICD-10-CM | POA: Diagnosis not present

## 2021-08-07 DIAGNOSIS — E038 Other specified hypothyroidism: Secondary | ICD-10-CM

## 2021-08-07 DIAGNOSIS — E785 Hyperlipidemia, unspecified: Secondary | ICD-10-CM | POA: Diagnosis not present

## 2021-08-07 DIAGNOSIS — E119 Type 2 diabetes mellitus without complications: Secondary | ICD-10-CM | POA: Diagnosis not present

## 2021-08-07 LAB — POCT GLYCOSYLATED HEMOGLOBIN (HGB A1C): Hemoglobin A1C: 6.2 % — AB (ref 4.0–5.6)

## 2021-08-07 LAB — GLUCOSE, CAPILLARY: Glucose-Capillary: 128 mg/dL — ABNORMAL HIGH (ref 70–99)

## 2021-08-07 MED ORDER — LOSARTAN POTASSIUM-HCTZ 50-12.5 MG PO TABS: 1.0000 | ORAL_TABLET | Freq: Every day | ORAL | 1 refills | Status: DC

## 2021-08-07 MED ORDER — LEVOTHYROXINE SODIUM 100 MCG PO TABS: 100.0000 ug | ORAL_TABLET | Freq: Every day | ORAL | 2 refills | Status: DC

## 2021-08-07 MED ORDER — ATORVASTATIN CALCIUM 40 MG PO TABS: 40.0000 mg | ORAL_TABLET | Freq: Every day | ORAL | 1 refills | Status: DC

## 2021-08-07 NOTE — Patient Instructions (Addendum)
Dear Mrs. Wambold, ? ?Thank you for trusting [place] with your care today. ? ?Today we discussed your thyroid, blood pressure, diabetes, high cholesterol. ? ?For your blood pressure: ?- Please restart the losartan-hydrochlorothiazide 50-12.'5mg'$  daily. ?- We will check your kidney function today.  ?- We would like to see you back in 1 month to follow up your blood pressure. ? ?For your thyroid ?- we will check some lab work today to see if we need to adjust your medication. We will not make any changes today. ? ?For your diabetes: ?- You have made great improvement in your a1c!Marland Kitchen Please keep up the great work.  ?- We do not need to start any medications for you today. ?- I have placed a referral to have your yearly diabetic eye exam.  ? ?For your cholesterol ?- please restart the Atorvastatin '40mg'$  daily. ?- We will check your cholesterol level in 2-3 months.  ? ?Please return in 1 month for a blood pressure follow up.  ?

## 2021-08-07 NOTE — Assessment & Plan Note (Addendum)
Patient taking 178mg synthroid. She has been on this dose for several years. No tachycardia, palpitations, or anxiety. She does report dry throat which she attributes to her synthroid and requests she be started on Amor thyroid.  ?Heart rate is regular. No diaphoresis. ?We will check TSH today and adjust medications accordingly.  ?

## 2021-08-07 NOTE — Assessment & Plan Note (Addendum)
Patient states that she has been attempting to control DM through diet modifications. Denies polydipsia or polyuria.  ?Foot exam showing intact sensation and no open wounds. ?A1c checked today and improved from prior. Today 6.2.  ?Congratulated patient on improvement and controlling through diet. She is hesitant to initiate new medications. We will defer metformin for now and continue with diet/lifestyle control.  ?Ophtho referral for diabetic eye exam placed. ?

## 2021-08-07 NOTE — Progress Notes (Signed)
? ?  CC: thyroid and med refill ? ?HPI:[name] is a 71 y.o. female who presents for evaluation of thyroid. Please see individual problem based A/P for details. ? ?Depression, PHQ-9: ?Based on the patients  ?[place] Office Visit from 03/05/2014 in [place]  ?PHQ-9 Total Score 15  ? ?  ? score we have 15. ? ?Past Medical History:  ?Diagnosis Date  ? Anxiety   ? Depression 02/21/2011  ? Diverticulosis   ? Fibroid uterus 05/06/2006  ? Nov 2011: total abdominal hysterectomy with b/l salpingoopherectomy by Dr. Mora Bellman for symptomatic fibroids.  Qualifier: Diagnosis of  By: [name] MD, [name]    ? History of colonic polyps   ? Hyperlipidemia   ? Hypertension   ? Hypothyroidism   ? Osteoarthritis 02/21/2011  ? MR b/l Hips (2009): Mild to moderate bilateral hip osteoarthritis, more notable on the right, with likely associated small area of anterior acetabular labral tearing.  MR R knee (2009): Dominant finding is marked interval progression in lateral compartment degenerative disease with new tearing in the posterior horn of the lateral meniscus. Persistent perimeniscal cyst formation related to lateral meniscal tearing as describe above MR Lumbar spine (2009) : 1. Dominant finding is advanced facet degenerative change L4-5 with small facet joint effusions and bony edema about the joints. This could be a source of back pain.  2. Negative for notable central canal and foraminal stenosis in patient with a somewhat congenitally narrowed central canal.    ? Plantar fasciitis, bilateral   ? Prediabetes   ? Seasonal allergies   ? ?Review of Systems:   ?Review of Systems  ?Constitutional: Negative.   ?HENT: Negative.    ?Eyes: Negative.   ?Respiratory: Negative.    ?Cardiovascular: Negative.   ?Gastrointestinal: Negative.   ?Genitourinary: Negative.   ?Musculoskeletal: Negative.   ?Skin: Negative.   ?Neurological: Negative.   ?Endo/Heme/Allergies: Negative.   ?Psychiatric/Behavioral:  Negative.     ? ?Physical Exam: ?Vitals:  ? 08/07/21 1415 08/07/21 1437 08/07/21 1541  ?BP: (!) 195/88 (!) 183/89 (!) 149/77  ?Pulse: 96  76  ?Temp: 98.9 ?F (37.2 ?C)    ?TempSrc: Oral    ?SpO2: 98%    ?Weight: 239 lb (108.4 kg)    ?Height: '5\' 2"'$  (1.575 m)    ? ? ? ?General: alert and oriented, obese ?HEENT: Conjunctiva nl , antiicteric sclerae, moist mucous membranes, no exudate or erythema ?Cardiovascular: Normal rate, regular rhythm.  No murmurs, rubs, or gallops ?Pulmonary : Equal breath sounds, No wheezes, rales, or rhonchi ?Abdominal: soft, nontender,  bowel sounds present ?Ext: No edema in lower extremities, no tenderness to palpation of lower extremities. Sensation intact, no open wounds. ? ?Assessment & Plan:  ? ?See Encounters Tab for problem based charting. ? ?Patient discussed with Dr.  Cain Sieve ? ?

## 2021-08-07 NOTE — Assessment & Plan Note (Signed)
Patient states she has not been taking her atorvastatin. She denies symptoms. No chest pain. States she prefers to control problems without medications as she does not want to experience side effects.  ?Restart statin. Will plan to recheck lipid panel in 2-3 months.  ?

## 2021-08-07 NOTE — Telephone Encounter (Signed)
Casts Sent to [employer] - HOLD for CMN °

## 2021-08-07 NOTE — Assessment & Plan Note (Signed)
Patient was started on losartan-hctz 50-12.5 at last OV 2021 for HTN 003 systolic, but reports that she did not fill Rx. She states she prefers not to take medication. States that her blood pressure tends to fluctuate between 704 systolic and 888. BP rechecked today and initially very elevated 195/88 and 183/89 on recheck. Patient denied symptoms and questioned the necessity of starting medications if she does not have symptoms. She also voiced concern over potential side effects of medications but admits that she never experienced any side effects as she did not take medication at all. Provided ample counseling on the importance of controlling BP to prevent future complications. Encouraged patient to start taking her previously prescribed BP meds. She voiced understanding.  ?BP remains severely elevated 2/2 medication no-adherence. We will plan to restart her hyzaar today. Additionally, given the chronicity and severity of her BP elevation, we will check a BMP and urine microalbumin:creatinine today as well. Plan for 1 month follow up to recheck BP and BMP. ?

## 2021-08-08 LAB — MICROALBUMIN / CREATININE URINE RATIO
Creatinine, Urine: 93.7 mg/dL
Microalb/Creat Ratio: 281 mg/g creat — ABNORMAL HIGH (ref 0–29)
Microalbumin, Urine: 263.1 ug/mL

## 2021-08-08 LAB — TSH: TSH: 0.502 u[IU]/mL (ref 0.450–4.500)

## 2021-08-08 NOTE — Progress Notes (Signed)
Internal Medicine Clinic Attending ° °Case discussed with Dr. Gawaluck  At the time of the visit.  We reviewed the resident’s history and exam and pertinent patient test results.  I agree with the assessment, diagnosis, and plan of care documented in the resident’s note.  °

## 2021-08-11 LAB — BMP8+ANION GAP
BUN/Creatinine Ratio: 14 (ref 12–28)
BUN: 14 mg/dL (ref 8–27)
CO2: 19 mmol/L — ABNORMAL LOW (ref 20–29)
Calcium: 11 mg/dL — ABNORMAL HIGH (ref 8.7–10.3)
Chloride: 107 mmol/L — ABNORMAL HIGH (ref 96–106)
Creatinine, Ser: 1 mg/dL (ref 0.57–1.00)
Glucose: 134 mg/dL — ABNORMAL HIGH (ref 70–99)
Potassium: 4.3 mmol/L (ref 3.5–5.2)
eGFR: 61 mL/min/{1.73_m2} (ref >59)

## 2021-09-08 ENCOUNTER — Encounter: Payer: Self-pay | Admitting: Internal Medicine

## 2021-09-08 ENCOUNTER — Ambulatory Visit (INDEPENDENT_AMBULATORY_CARE_PROVIDER_SITE_OTHER): Payer: Medicare Other | Admitting: Internal Medicine

## 2021-09-08 VITALS — BP 191/92 | HR 77 | Temp 98.1°F | Ht 62.0 in | Wt 238.8 lb

## 2021-09-08 DIAGNOSIS — I10 Essential (primary) hypertension: Secondary | ICD-10-CM | POA: Diagnosis not present

## 2021-09-08 DIAGNOSIS — E785 Hyperlipidemia, unspecified: Secondary | ICD-10-CM

## 2021-09-08 DIAGNOSIS — M5442 Lumbago with sciatica, left side: Secondary | ICD-10-CM | POA: Diagnosis not present

## 2021-09-08 DIAGNOSIS — G8929 Other chronic pain: Secondary | ICD-10-CM

## 2021-09-08 DIAGNOSIS — E669 Obesity, unspecified: Secondary | ICD-10-CM | POA: Insufficient documentation

## 2021-09-08 DIAGNOSIS — M5441 Lumbago with sciatica, right side: Secondary | ICD-10-CM

## 2021-09-08 DIAGNOSIS — R7303 Prediabetes: Secondary | ICD-10-CM | POA: Diagnosis not present

## 2021-09-08 DIAGNOSIS — Z6841 Body Mass Index (BMI) 40.0 and over, adult: Secondary | ICD-10-CM

## 2021-09-08 MED ORDER — DULOXETINE HCL 30 MG PO CPEP: 30.0000 mg | ORAL_CAPSULE | Freq: Every day | ORAL | 2 refills | Status: DC

## 2021-09-08 NOTE — Assessment & Plan Note (Signed)
Wt Readings from Last 3 Encounters: ?09/08/21 238 lb 12.8 oz (108.3 kg) ?08/07/21 239 lb (108.4 kg) ?04/20/20 255 lb (115.7 kg) ?Patient is prediabetic, living with hypertension, and obesity. She is walking at home.  She is had weight loss and very motivated during conversation.  I believe she would benefit from structured exercise program.   ? ?Assessment/Plan: Class 3 severe obesity due to excess calories without serious comorbidity with body mass index (BMI) of 40.0 to 44.9 in adult [facility]) ?- Amb Referral To Provider Referral Exercise Program (P.R.E.P) ? ? ?

## 2021-09-08 NOTE — Assessment & Plan Note (Signed)
History of chronic back pain with bilateral radiculopathy.  Last MRI in 2022 with lumbar spondylosis and mild stenosis L3-L4, L5-S1, facet arthrosis at L4-L5.  She has been managing pain with ibuprofen daily.  She reports tylenol gave her a weird feeling.  We discussed using lidocaine patches.  Patient is overweight with BMI of 43.   ? ? - DULoxetine (CYMBALTA) 30 MG capsule; Take 1 capsule (30 mg total) by mouth daily.  Dispense: 30 capsule; Refill: 2 ?- Ambulatory referral to Physical Therapy ?- Recommend stopping ibuprofen given hypertension and also on ARB. ? ? ? ?

## 2021-09-08 NOTE — Assessment & Plan Note (Signed)
Hypercalcemia on previous lab work before starting HCTZ.  We will recheck today. ?- CMP14 + Anion Gap ?

## 2021-09-08 NOTE — Progress Notes (Addendum)
? ?CC: Low back pain with sciatica, high blood pressure, obesity, prediabetes, high cholesterol ? ?HPI:[name] is a 71 y.o. female who presents for evaluation of chronic back pain with bilateral sciatica, HTN, obesity, prediabetes, HLD and hypercalcemia. Please see individual problem based A/P for details. ? ?Past Medical History:  ?Diagnosis Date  ? Anxiety   ? Depression 02/21/2011  ? Diverticulosis   ? Fibroid uterus 05/06/2006  ? Nov 2011: total abdominal hysterectomy with b/l salpingoopherectomy by Dr. Mora Gilbert for symptomatic fibroids.  Qualifier: Diagnosis of  By: Joanna Starch MD, Joanna Gilbert    ? History of colonic polyps   ? Hyperlipidemia   ? Hypertension   ? Hypothyroidism   ? Osteoarthritis 02/21/2011  ? MR b/l Hips (2009): Mild to moderate bilateral hip osteoarthritis, more notable on the right, with likely associated small area of anterior acetabular labral tearing.  MR R knee (2009): Dominant finding is marked interval progression in lateral compartment degenerative disease with new tearing in the posterior horn of the lateral meniscus. Persistent perimeniscal cyst formation related to lateral meniscal tearing as describe above MR Lumbar spine (2009) : 1. Dominant finding is advanced facet degenerative change L4-5 with small facet joint effusions and bony edema about the joints. This could be a source of back pain.  2. Negative for notable central canal and foraminal stenosis in patient with a somewhat congenitally narrowed central canal.    ? Plantar fasciitis, bilateral   ? Prediabetes   ? Seasonal allergies   ? ?Review of Systems:   ?Review of Systems  ?Constitutional:  Negative for chills and fever.  ?Eyes:  Negative for blurred vision.  ?Cardiovascular:  Negative for chest pain and leg swelling.  ?Neurological:  Negative for dizziness and headaches.   ? ?Physical Exam: ?Vitals:  ? 09/08/21 0927 09/08/21 1048 09/08/21 1049  ?BP: (!) 135/124 (!) 179/100 (!) 191/92  ?Pulse: 94 74 77  ?Temp: 98.1  ?F (36.7 ?C)    ?TempSrc: Oral    ?SpO2: 100%    ?Weight: 238 lb 12.8 oz (108.3 kg)    ?Height: '5\' 2"'$  (1.575 m)    ? ? ? ?Physical Exam ?Constitutional:   ?   General: She is not in acute distress. ?   Appearance: She is obese.  ?Cardiovascular:  ?   Rate and Rhythm: Normal rate and regular rhythm.  ?   Heart sounds: No murmur heard. ?Pulmonary:  ?   Effort: Pulmonary effort is normal.  ?   Breath sounds: Normal breath sounds.  ?Skin: ?   General: Skin is warm and dry.  ?Neurological:  ?   Mental Status: She is alert and oriented to person, place, and time.  ? ? ? ?Assessment & Plan:  ? ?Chronic back pain ?History of chronic back pain with bilateral radiculopathy.  Last MRI in 2022 with lumbar spondylosis and mild stenosis L3-L4, L5-S1, facet arthrosis at L4-L5.  She has been managing pain with ibuprofen daily.  She reports tylenol gave her a weird feeling.  We discussed using lidocaine patches.  Patient is overweight with BMI of 43.   ? ? - DULoxetine (CYMBALTA) 30 MG capsule; Take 1 capsule (30 mg total) by mouth daily.  Dispense: 30 capsule; Refill: 2 ?- Ambulatory referral to Physical Therapy ?- Recommend stopping ibuprofen given hypertension and also on ARB. ? ? ? ? ?Hyperlipidemia ? ?Patient started on Lipitor 4 weeks ago for primary prevention. The 10-year ASCVD risk score (Arnett DK, et al., 2019) is: 53.1%  Total cholesterol 238, LDL 158.  ?  ?-recheck lipid panel today. ? ?Hypertension ? ?BP Readings from Last 3 Encounters: ?09/08/21 (!) 191/92 ?08/07/21 (!) 149/77 ?04/20/20 (!) 177/74 ? The patient endorses adherence to her medication regimen of losartan/HCTZ.  Discussed concern for noncompliance with patient, but she reports taking medication daily for one month. She missed 3 days in total the last month, but took medication today. She has been taking ibuprofen daily for low back bain with bilateral sciatica  Her blood work showed hypercalcemia at last visiting before starting HCTZ. ? ?Assessment/Plan:  Chronic hypertension, uncontrolled, moderate microalbuminuria ('281mg'$ /g) .   ?-Discussed patient will need further titration of antihypertensive.  Check blood work today and plan to call patient with results to prescribe additional antihypertensives. ?-Stop bang score 7, referral for Nocturnal polysomnography (NPSG) ? ? ? ?Hypercalcemia ?Hypercalcemia on previous lab work before starting HCTZ.  We will recheck today. ?- CMP14 + Anion Gap ? ?Obesity ?Wt Readings from Last 3 Encounters: ?09/08/21 238 lb 12.8 oz (108.3 kg) ?08/07/21 239 lb (108.4 kg) ?04/20/20 255 lb (115.7 kg) ?Patient is prediabetic, living with hypertension, and obesity. She is walking at home.  She is had weight loss and very motivated during conversation.  I believe she would benefit from structured exercise program.   ? ?Assessment/Plan: Class 3 severe obesity due to excess calories without serious comorbidity with body mass index (BMI) of 40.0 to 44.9 in adult [facility]) ?- Amb Referral To Provider Referral Exercise Program (P.R.E.P) ? ? ? ?Prediabetes ? ?Patient had hemoglobin A1c 6.7 one year ago.  One month ago hemoglobin A1c 6.2.  Patient has been working on her diet and walks everyday. She enjoys cooking. Her BMI is 43. ? ?Assessment/Plan:  Prediabetes ?- Referral to Nutrition and Diabetes Services ? ? ? ?Patient discussed with Joanna Gilbert ? ?

## 2021-09-08 NOTE — Assessment & Plan Note (Signed)
?  BP Readings from Last 3 Encounters: ?09/08/21 (!) 191/92 ?08/07/21 (!) 149/77 ?04/20/20 (!) 177/74 ? The patient endorses adherence to her medication regimen of losartan/HCTZ.  Discussed concern for noncompliance with patient, but she reports taking medication daily for one month. She missed 3 days in total the last month, but took medication today. She has been taking ibuprofen daily for low back bain with bilateral sciatica  Her blood work showed hypercalcemia at last visiting before starting HCTZ. ? ?Assessment/Plan: Chronic hypertension, uncontrolled, moderate microalbuminuria ('281mg'$ /g) .   ?-Discussed patient will need further titration of antihypertensive.  Check blood work today and plan to call patient with results to prescribe additional antihypertensives. ?-Stop bang score 7, referral for Nocturnal polysomnography (NPSG) ? ? ?

## 2021-09-08 NOTE — Assessment & Plan Note (Signed)
?  Patient started on Lipitor 4 weeks ago for primary prevention. The 10-year ASCVD risk score (Arnett DK, et al., 2019) is: 53.1% Total cholesterol 238, LDL 158.  ?  ?-recheck lipid panel today. ?

## 2021-09-08 NOTE — Assessment & Plan Note (Signed)
?  Patient had hemoglobin A1c 6.7 one year ago.  One month ago hemoglobin A1c 6.2.  Patient has been working on her diet and walks everyday. She enjoys cooking. Her BMI is 43. ? ?Assessment/Plan:  Prediabetes ?- Referral to Nutrition and Diabetes Services ? ?

## 2021-09-08 NOTE — Patient Instructions (Signed)
Thank you for trusting me with your care. To recap, today we discussed the following: ? ? ?1. Prediabetes ?- Referral to Nutrition and Diabetes Services ? ?2. Class 3 severe obesity due to excess calories without serious comorbidity with body mass index (BMI) of 40.0 to 44.9 in adult [facility] ?- Amb Referral To Provider Referral Exercise Program (P.R.E.P) ? ?3. Hypertension, unspecified type ?- Nocturnal polysomnography (NPSG) ?- CMP14 + Anion Gap ? ?4. Hyperlipidemia, unspecified hyperlipidemia type ?- Lipid Profile ? ?5. Chronic bilateral low back pain with bilateral sciatica ?- DULoxetine (CYMBALTA) 30 MG capsule; Take 1 capsule (30 mg total) by mouth daily.  Dispense: 30 capsule; Refill: 2 ?- Ambulatory referral to Physical Therapy ? ? ?

## 2021-09-09 LAB — CMP14 + ANION GAP
ALT: 31 IU/L (ref 0–32)
AST: 31 IU/L (ref 0–40)
Albumin/Globulin Ratio: 1.3 (ref 1.2–2.2)
Albumin: 4.4 g/dL (ref 3.8–4.8)
Alkaline Phosphatase: 133 IU/L — ABNORMAL HIGH (ref 44–121)
Anion Gap: 17 mmol/L (ref 10.0–18.0)
BUN/Creatinine Ratio: 14 (ref 12–28)
BUN: 11 mg/dL (ref 8–27)
Bilirubin Total: 0.3 mg/dL (ref 0.0–1.2)
CO2: 24 mmol/L (ref 20–29)
Calcium: 10.5 mg/dL — ABNORMAL HIGH (ref 8.7–10.3)
Chloride: 99 mmol/L (ref 96–106)
Creatinine, Ser: 0.79 mg/dL (ref 0.57–1.00)
Globulin, Total: 3.3 g/dL (ref 1.5–4.5)
Glucose: 132 mg/dL — ABNORMAL HIGH (ref 70–99)
Potassium: 4.1 mmol/L (ref 3.5–5.2)
Sodium: 140 mmol/L (ref 134–144)
Total Protein: 7.7 g/dL (ref 6.0–8.5)
eGFR: 81 mL/min/{1.73_m2} (ref >59)

## 2021-09-09 LAB — LIPID PANEL
Chol/HDL Ratio: 2.4 ratio (ref 0.0–4.4)
Cholesterol, Total: 168 mg/dL (ref 100–199)
HDL: 70 mg/dL (ref >39)
LDL Chol Calc (NIH): 82 mg/dL (ref 0–99)
Triglycerides: 88 mg/dL (ref 0–149)
VLDL Cholesterol Cal: 16 mg/dL (ref 5–40)

## 2021-09-11 ENCOUNTER — Telehealth: Payer: Self-pay | Admitting: Internal Medicine

## 2021-09-11 DIAGNOSIS — I10 Essential (primary) hypertension: Secondary | ICD-10-CM

## 2021-09-11 MED ORDER — AMLODIPINE-OLMESARTAN 10-40 MG PO TABS: 1.0000 | ORAL_TABLET | Freq: Every day | ORAL | 1 refills | Status: DC

## 2021-09-11 NOTE — Telephone Encounter (Addendum)
Called patient and updated her on blood work.  Calcium is still elevated and has elevated alk phos.  She continues to have acute on chronic back pain, not sure if fluoxetine is helping.  She started duloxetine 3 days ago. ? ?- stop losartan-hctz ?- amLODipine-olmesartan (AZOR) 10-40 MG tablet; Take 1 tablet by mouth daily.  Dispense: 30 tablet; Refill: 1 ?- Follow up next week for workup of hypercalcemia and elevated alk phos ? ? ?

## 2021-09-14 NOTE — Progress Notes (Signed)
Internal Medicine Clinic Attending  Case discussed with Dr. Steen  At the time of the visit.  We reviewed the resident's history and exam and pertinent patient test results.  I agree with the assessment, diagnosis, and plan of care documented in the resident's note.  

## 2021-09-19 ENCOUNTER — Ambulatory Visit (INDEPENDENT_AMBULATORY_CARE_PROVIDER_SITE_OTHER): Payer: Medicare Other | Admitting: Internal Medicine

## 2021-09-19 ENCOUNTER — Encounter: Payer: Self-pay | Admitting: Internal Medicine

## 2021-09-19 DIAGNOSIS — R748 Abnormal levels of other serum enzymes: Secondary | ICD-10-CM | POA: Diagnosis not present

## 2021-09-19 DIAGNOSIS — Z13820 Encounter for screening for osteoporosis: Secondary | ICD-10-CM | POA: Diagnosis not present

## 2021-09-19 NOTE — Patient Instructions (Signed)
Thank you for trusting me with your care. To recap, today we discussed the following: ? ? ?Labs check today: ?- CBC with Diff ?- CMP14 + Anion Gap ?- Gamma GT ? ?I will call with results.  ? ?

## 2021-09-19 NOTE — Progress Notes (Signed)
? ?  CC: hypercalcemia and elevated alkaline phosphatase  ? ?HPI:[name] is a 71 y.o. female who presents for evaluation of hypercalcemia and elevated alkaline phosphatase. Please see individual problem based A/P for details. ? ?Past Medical History:  ?Diagnosis Date  ? Anxiety   ? Depression 02/21/2011  ? Diverticulosis   ? Fibroid uterus 05/06/2006  ? Nov 2011: total abdominal hysterectomy with b/l salpingoopherectomy by Dr. Mora Bellman for symptomatic fibroids.  Qualifier: Diagnosis of  By: [name] MD, [name]    ? History of colonic polyps   ? Hyperlipidemia   ? Hypertension   ? Hypothyroidism   ? Osteoarthritis 02/21/2011  ? MR b/l Hips (2009): Mild to moderate bilateral hip osteoarthritis, more notable on the right, with likely associated small area of anterior acetabular labral tearing.  MR R knee (2009): Dominant finding is marked interval progression in lateral compartment degenerative disease with new tearing in the posterior horn of the lateral meniscus. Persistent perimeniscal cyst formation related to lateral meniscal tearing as describe above MR Lumbar spine (2009) : 1. Dominant finding is advanced facet degenerative change L4-5 with small facet joint effusions and bony edema about the joints. This could be a source of back pain.  2. Negative for notable central canal and foraminal stenosis in patient with a somewhat congenitally narrowed central canal.    ? Plantar fasciitis, bilateral   ? Prediabetes   ? Seasonal allergies   ? ?Review of Systems:   ?Review of Systems  ?Constitutional:  Negative for chills and fever.  ?Gastrointestinal:  Negative for abdominal pain, constipation, diarrhea and nausea.  ?Musculoskeletal:  Positive for back pain (Chronic). Negative for falls.   ? ?Physical Exam: ?Vitals:  ? 09/19/21 1022  ?BP: 130/75  ?Pulse: 76  ?Temp: 98.1 ?F (36.7 ?C)  ?TempSrc: Oral  ?SpO2: 100%  ?Weight: 233 lb 9.6 oz (106 kg)  ?Height: $RemoveB'5\' 2"'YaaOoOKg$  (1.575 m)  ? ? ? ?Physical Exam ?Constitutional:    ?   Appearance: She is well-groomed. She is obese. She is not ill-appearing.  ?Cardiovascular:  ?   Rate and Rhythm: Normal rate and regular rhythm.  ?Pulmonary:  ?   Effort: Pulmonary effort is normal.  ?   Breath sounds: Normal breath sounds.  ? ? ? ?Assessment & Plan:  ? ?Hypercalcemia ?Hypercalcemia on previous lab work now off HCTZ. Repeated CMP today and will further workup hypercalcemia if elevated ? ? ? ?Elevated alkaline phosphatase measurement ?Patient has elevated alk phos. Higher limit of nl is 121 and patient 134. Review of records shows alk phos elevated to this level in 2014. She has hypercalcemia, but borderline. Will recheck CMP today for calcium and alk phos. Also will add on CBC to see if patient is anemic. Patient is asymptomatic with exception of chronic back pain and patient evaluated with MRI in 2022. Discussed this may be minor lab abnormalities and not clinically significant with patient. I recommend following up with lab work so I can make more of an evaluation.  Patient agreeable to obtaining these follow up labs.  ? ? ? ?Patient discussed with Dr.  Cain Sieve ? ?

## 2021-09-20 ENCOUNTER — Encounter: Payer: Self-pay | Admitting: Internal Medicine

## 2021-09-20 DIAGNOSIS — R748 Abnormal levels of other serum enzymes: Secondary | ICD-10-CM | POA: Insufficient documentation

## 2021-09-20 LAB — CMP14 + ANION GAP
ALT: 32 IU/L (ref 0–32)
AST: 31 IU/L (ref 0–40)
Albumin/Globulin Ratio: 1.2 (ref 1.2–2.2)
Albumin: 4.7 g/dL (ref 3.8–4.8)
Alkaline Phosphatase: 134 IU/L — ABNORMAL HIGH (ref 44–121)
Anion Gap: 16 mmol/L (ref 10.0–18.0)
BUN/Creatinine Ratio: 13 (ref 12–28)
BUN: 10 mg/dL (ref 8–27)
Bilirubin Total: 0.4 mg/dL (ref 0.0–1.2)
CO2: 23 mmol/L (ref 20–29)
Calcium: 10.4 mg/dL — ABNORMAL HIGH (ref 8.7–10.3)
Chloride: 102 mmol/L (ref 96–106)
Creatinine, Ser: 0.76 mg/dL (ref 0.57–1.00)
Globulin, Total: 3.8 g/dL (ref 1.5–4.5)
Glucose: 114 mg/dL — ABNORMAL HIGH (ref 70–99)
Potassium: 4.4 mmol/L (ref 3.5–5.2)
Sodium: 141 mmol/L (ref 134–144)
Total Protein: 8.5 g/dL (ref 6.0–8.5)
eGFR: 85 mL/min/{1.73_m2} (ref >59)

## 2021-09-20 LAB — CBC WITH DIFFERENTIAL/PLATELET
Basophils Absolute: 0 10*3/uL (ref 0.0–0.2)
Basos: 0 %
EOS (ABSOLUTE): 0.1 10*3/uL (ref 0.0–0.4)
Eos: 2 %
Hematocrit: 40 % (ref 34.0–46.6)
Hemoglobin: 13.1 g/dL (ref 11.1–15.9)
Immature Grans (Abs): 0 10*3/uL (ref 0.0–0.1)
Immature Granulocytes: 0 %
Lymphocytes Absolute: 3.6 10*3/uL — ABNORMAL HIGH (ref 0.7–3.1)
Lymphs: 42 %
MCH: 29.6 pg (ref 26.6–33.0)
MCHC: 32.8 g/dL (ref 31.5–35.7)
MCV: 90 fL (ref 79–97)
Monocytes Absolute: 0.4 10*3/uL (ref 0.1–0.9)
Monocytes: 5 %
Neutrophils Absolute: 4.4 10*3/uL (ref 1.4–7.0)
Neutrophils: 51 %
Platelets: 317 10*3/uL (ref 150–450)
RBC: 4.43 x10E6/uL (ref 3.77–5.28)
RDW: 11.9 % (ref 11.7–15.4)
WBC: 8.5 10*3/uL (ref 3.4–10.8)

## 2021-09-20 LAB — GAMMA GT: GGT: 39 IU/L (ref 0–60)

## 2021-09-20 NOTE — Assessment & Plan Note (Signed)
Patient has elevated alk phos. Higher limit of nl is 121 and patient 134. Review of records shows alk phos elevated to this level in 2014. She has hypercalcemia, but borderline. Will recheck CMP today for calcium and alk phos. Also will add on CBC to see if patient is anemic. Patient is asymptomatic with exception of chronic back pain and patient evaluated with MRI in 2022. Discussed this may be minor lab abnormalities and not clinically significant with patient. I recommend following up with lab work so I can make more of an evaluation.  Patient agreeable to obtaining these follow up labs.  ?

## 2021-09-20 NOTE — Assessment & Plan Note (Signed)
Hypercalcemia on previous lab work now off HCTZ. Repeated CMP today and will further workup hypercalcemia if elevated ? ? ?

## 2021-09-21 ENCOUNTER — Telehealth: Payer: Self-pay | Admitting: Internal Medicine

## 2021-09-21 DIAGNOSIS — R748 Abnormal levels of other serum enzymes: Secondary | ICD-10-CM

## 2021-09-21 NOTE — Progress Notes (Signed)
Internal Medicine Clinic Attending ? ?Case discussed with Dr. Court Joy  At the time of the visit.  We reviewed the resident?s history and exam and pertinent patient test results.  I agree with the assessment, diagnosis, and plan of care documented in the resident?s note.  ? ?Patient has had a chronically mildly elevated alk phos level, 134 today. GGT normal at 39. Alk phos level is stable from when she had normal MRI spine in 2022, and her corrected calcium levels are now normal off of HCTZ, so I agree with Dr. Court Joy that she doesn't need additional workup at this time. I recommend CMP yearly to trend alk phos. ? ? ? ?

## 2021-09-21 NOTE — Telephone Encounter (Signed)
Called and reviewed labs with Joanna Gilbert. Calcium has normalized. Alk phos mildly elevated and at this level for 9 years. GGT 39, upper limit of nl. Not recommending further workup at this time. Will place under problem list so it can be monitored with yearly CMP if desired.  ?

## 2021-09-28 ENCOUNTER — Ambulatory Visit: Payer: Medicare Other

## 2021-10-02 LAB — HM DIABETES EYE EXAM

## 2021-10-03 ENCOUNTER — Telehealth: Payer: Self-pay

## 2021-10-03 NOTE — Telephone Encounter (Signed)
Documents received - shoes ordered - [employer] [id number] 9.5W ?

## 2021-10-04 ENCOUNTER — Ambulatory Visit: Payer: Medicare Other | Attending: Internal Medicine

## 2021-10-04 DIAGNOSIS — M6281 Muscle weakness (generalized): Secondary | ICD-10-CM | POA: Insufficient documentation

## 2021-10-04 DIAGNOSIS — R2689 Other abnormalities of gait and mobility: Secondary | ICD-10-CM | POA: Diagnosis present

## 2021-10-04 DIAGNOSIS — M5442 Lumbago with sciatica, left side: Secondary | ICD-10-CM | POA: Diagnosis not present

## 2021-10-04 DIAGNOSIS — M5459 Other low back pain: Secondary | ICD-10-CM | POA: Insufficient documentation

## 2021-10-04 DIAGNOSIS — G8929 Other chronic pain: Secondary | ICD-10-CM | POA: Diagnosis not present

## 2021-10-04 DIAGNOSIS — M5441 Lumbago with sciatica, right side: Secondary | ICD-10-CM | POA: Diagnosis not present

## 2021-10-04 NOTE — Therapy (Signed)
?OUTPATIENT PHYSICAL THERAPY THORACOLUMBAR EVALUATION ? ? ?Patient Name: Joanna Gilbert ?MRN: [id number] ?DOB:[birth year], 71 y.o., female ?Today's Date: 10/04/2021 ? ? PT End of Session - 10/04/21 1124   ? ? Visit Number 1   ? Number of Visits 17   ? Date for PT Re-Evaluation 11/29/21   ? Authorization Type [employer] Medicare   ? PT Start Time 1130   ? PT Stop Time 1213   ? PT Time Calculation (min) 43 min   ? Activity Tolerance Patient tolerated treatment well   ? Behavior During Therapy [facility] for tasks assessed/performed   ? ?  ?  ? ?  ? ? ?Past Medical History:  ?Diagnosis Date  ? Anxiety   ? Depression 02/21/2011  ? Diverticulosis   ? Fibroid uterus 05/06/2006  ? Nov 2011: total abdominal hysterectomy with b/l salpingoopherectomy by Dr. Mora Bellman for symptomatic fibroids.  Qualifier: Diagnosis of  By: [name] MD, [name]    ? History of colonic polyps   ? Hyperlipidemia   ? Hypertension   ? Hypothyroidism   ? Osteoarthritis 02/21/2011  ? MR b/l Hips (2009): Mild to moderate bilateral hip osteoarthritis, more notable on the right, with likely associated small area of anterior acetabular labral tearing.  MR R knee (2009): Dominant finding is marked interval progression in lateral compartment degenerative disease with new tearing in the posterior horn of the lateral meniscus. Persistent perimeniscal cyst formation related to lateral meniscal tearing as describe above MR Lumbar spine (2009) : 1. Dominant finding is advanced facet degenerative change L4-5 with small facet joint effusions and bony edema about the joints. This could be a source of back pain.  2. Negative for notable central canal and foraminal stenosis in patient with a somewhat congenitally narrowed central canal.    ? Plantar fasciitis, bilateral   ? Prediabetes   ? Seasonal allergies   ? ?Past Surgical History:  ?Procedure Laterality Date  ? GASTRIC RESTRICTION SURGERY  1980  ? KNEE SURGERY Right 2007, 2009  ? Had cyst removed  ? LAPAROSCOPIC CHOLECYSTECTOMY   1990  ? TOTAL ABDOMINAL HYSTERECTOMY W/ BILATERAL SALPINGOOPHORECTOMY  03/2010  ? because of symptomatic fibroids, Dr. Vickii Chafe Constant  ? ?Patient Active Problem List  ? Diagnosis Date Noted  ? Elevated alkaline phosphatase measurement 09/20/2021  ? Hypercalcemia 09/08/2021  ? Obesity 09/08/2021  ? Prediabetes 09/08/2021  ? Type 2 diabetes mellitus with other specified complication ([place]) 60/73/7106  ? Systolic murmur 26/94/8546  ? Hypertension 01/02/2018  ? Bilateral rotator cuff dysfunction 07/19/2016  ? Chronic back pain 08/03/2015  ? Malaise 08/03/2015  ? Paresthesias 08/03/2015  ? Healthcare maintenance 08/03/2015  ? History of colonic polyps 08/03/2015  ? Diabetes ([place]) 09/09/2012  ? Hyperlipidemia   ? Depression with anxiety 02/21/2011  ? Osteoarthritis 02/21/2011  ? Hypothyroidism 05/06/2006  ? ? ?PCP:  ?Velna Ochs, MD ? ?REFERRING PROVIDER:  ?Velna Ochs, MD ? ?REFERRING DIAG:  ?M54.42,M54.41,G89.29 (ICD-10-CM) - Chronic bilateral low back pain with bilateral sciatica ? ?THERAPY DIAG:  ?Other low back pain ? ?Muscle weakness (generalized) ? ?Other abnormalities of gait and mobility ? ?ONSET DATE:  ?Chronic ? ?SUBJECTIVE:                                                                                                                                                                                          ? ?  SUBJECTIVE STATEMENT: ?Pt presents to PT with reports of chronic lower back pain and discomfort. She had previous R knee pain and surgery and bilateral hip pain. Believes her back pain started in the late 1980s due to sleeping on a water bed for a prolonged period of time. Has had chronic flare ups and downs, but her most recent pain has lingered for the last few months. Likes to walk recreationally and has been limited in this lately. Pt notes occasional paresthesias in LE, but this is not frequent. Denies any red flag items such as bowel/bladder changes or saddle anesthesia.  ? ?PERTINENT  HISTORY:  ?HTN, DM II ? ?PAIN:  ?Are you having pain?  ?Yes: NPRS scale: 3/10 (9/10 at worst) ?Pain location: lower back; posterior hip ?Pain description: dull ache ?Aggravating factors: prolonged standing, walking ?Relieving factors: medication ? ? ?PRECAUTIONS: None ? ?WEIGHT BEARING RESTRICTIONS No ? ?FALLS:  ?Has patient fallen in last 6 months? No ? ?LIVING ENVIRONMENT: ?Lives with: lives with their family ?Lives in: House/apartment ?Stairs: 12 stairs R handrail ?Has following equipment at home: Gilford Rile - 4 wheeled ? ?OCCUPATION: Not currently working ? ?PLOF: Independent and Independent with basic ADLs ? ?PATIENT GOALS: decrease lower back pain,  ? ? ?OBJECTIVE:  ? ?DIAGNOSTIC FINDINGS:   ?CLINICAL DATA:  Chronic low back pain radiating to the buttocks and ?down the legs with associated weakness and numbness. ?  ?EXAM: ?MRI LUMBAR SPINE WITHOUT CONTRAST ?  ?TECHNIQUE: ?Multiplanar, multisequence MR imaging of the lumbar spine was ?performed. No intravenous contrast was administered. ?  ?COMPARISON:  MR lumbar 06/12/2007. ?  ?FINDINGS: ?Segmentation:  Standard. ?  ?Alignment: New facet mediated anterolisthesis of L4 on L5 measuring ?2 mm. ?  ?Vertebrae: No fracture, suspicious marrow lesion, or significant ?marrow edema. Hemangioma in the T11 vertebral body. ?  ?Conus medullaris and cauda equina: Conus extends to the L1-2 level. ?Conus and cauda equina appear normal. ?  ?Paraspinal and other soft tissues: Unremarkable. ?  ?Disc levels: ?  ?T12-L1: Negative. ?  ?L1-2: Normal disc. Mild facet and ligamentum flavum hypertrophy ?without stenosis. ?  ?L2-3: New disc desiccation. Moderate facet and ligamentum flavum ?hypertrophy without disc herniation or significant stenosis. ?  ?L3-4: Disc desiccation. Disc bulging, congenitally short pedicles, ?and moderate facet and ligamentum flavum hypertrophy result in new ?mild spinal stenosis without significant neural foraminal stenosis. ?  ?L4-5: Disc desiccation.  Anterolisthesis with disc uncovering and ?moderate to severe right greater than left facet hypertrophy without ?significant stenosis. ?  ?L5-S1: A left central to left foraminal disc protrusion and moderate ?facet hypertrophy result in new mild left lateral recess and mild ?left neural foraminal stenosis without spinal stenosis. ?  ?IMPRESSION: ?1. Mildly progressive lumbar spondylosis and advanced facet ?arthrosis. ?2. New mild spinal stenosis at L3-4. ?3. New mild left lateral recess and left neural foraminal stenosis ?at L5-S1. ?4. Advanced facet arthrosis at L4-5 with new grade 1 anterolisthesis ?but no stenosis.  ? ? ?PATIENT SURVEYS:  ?FOTO 43% function; 56% predicted ? ?COGNITION: ? Overall cognitive status: Within functional limits for tasks assessed   ?  ?SENSATION: ?WFL ? ?MUSCLE LENGTH: ?Hamstrings: Right DNT deg; Left DNT deg ? ?POSTURE:  ?Large body habitus, increased lumbar lordosis, slumped sitting posture ? ?PALPATION: ?TTP to bilateral piriformis and L lumbar paraspinals ? ?LE MMT: ? ?MMT Right ?10/04/2021 Left ?10/04/2021  ?Hip flexion  5/5 5/5  ?Hip extension    ?Hip abduction 4/5 4/5  ?Hip adduction 5/5 5/5  ?Hip external rotation    ?  Hip internal rotation    ?Knee extension 5/5 5/5  ?Knee flexion 5/5 5/5  ?Ankle dorsiflexion     ?Ankle plantarflexion    ?Ankle inversion    ?Ankle eversion    ?Grossly    ?(Blank rows = not tested)  ? ?LUMBAR SPECIAL TESTS:  ? SLR: Left Positive ? ?FUNCTIONAL TESTS:  ?30 Second Sit to Stand: 13 reps ? ?GAIT: ?Distance walked: 72f ?Assistive device utilized: None ?Level of assistance: Complete Independence ?Comments: no overt gait deviations ? ?[facility]  [facility]                                                [facility] ?Therapeutic Exercise: ?Supine PPT x 5 - 5" hold ?Supine SLR x 5 each ?S/L clamshell x 5 GTB ?STS x 10 - no UE support ? ?PATIENT EDUCATION:  ?Education details: eval findings, FOTO, HEP, POC ?Person educated:  Patient ?Education method: Explanation, Demonstration, and Handouts ?Education comprehension: verbalized understanding and returned demonstration ? ? ?HOME EXERCISE PROGRAM: ?Access Code: [email or web address] ?URL: [email or web address]

## 2021-10-10 ENCOUNTER — Encounter: Payer: Self-pay | Admitting: Dietician

## 2021-10-16 ENCOUNTER — Telehealth: Payer: Self-pay

## 2021-10-16 ENCOUNTER — Ambulatory Visit: Payer: Medicare Other

## 2021-10-16 DIAGNOSIS — M5459 Other low back pain: Secondary | ICD-10-CM

## 2021-10-16 DIAGNOSIS — M6281 Muscle weakness (generalized): Secondary | ICD-10-CM

## 2021-10-16 DIAGNOSIS — R2689 Other abnormalities of gait and mobility: Secondary | ICD-10-CM

## 2021-10-16 NOTE — Telephone Encounter (Signed)
Called to let patient know her shoes and insoles are almost ready, we are expecting them to arrive any day now and that once we do we will call to schedule her appointment. Patient is satsified and all questions are answered

## 2021-10-16 NOTE — Therapy (Signed)
OUTPATIENT PHYSICAL THERAPY TREATMENT NOTE   Patient Name: Joanna Gilbert MRN: [id number] DOB:Jul 14, 1950, 71 y.o., female Today's Date: 10/16/2021  PCP: Velna Ochs, MD REFERRING PROVIDER: Velna Ochs, MD  END OF SESSION:   PT End of Session - 10/16/21 0938     Visit Number 2    Number of Visits 17    Date for PT Re-Evaluation 11/29/21    Authorization Type [employer]HC Medicare    PT Start Time 0936    PT Stop Time 1015    PT Time Calculation (min) 39 min    Activity Tolerance Patient tolerated treatment well    Behavior During Therapy [facility] for tasks assessed/performed             Past Medical History:  Diagnosis Date   Anxiety    Depression 02/21/2011   Diverticulosis    Fibroid uterus 05/06/2006   Nov 2011: total abdominal hysterectomy with b/l salpingoopherectomy by Dr. Mora Bellman for symptomatic fibroids.  Qualifier: Diagnosis of  By: [name] MD, Yuri     History of colonic polyps    Hyperlipidemia    Hypertension    Hypothyroidism    Osteoarthritis 02/21/2011   MR b/l Hips (2009): Mild to moderate bilateral hip osteoarthritis, more notable on the right, with likely associated small area of anterior acetabular labral tearing.  MR R knee (2009): Dominant finding is marked interval progression in lateral compartment degenerative disease with new tearing in the posterior horn of the lateral meniscus. Persistent perimeniscal cyst formation related to lateral meniscal tearing as describe above MR Lumbar spine (2009) : 1. Dominant finding is advanced facet degenerative change L4-5 with small facet joint effusions and bony edema about the joints. This could be a source of back pain.  2. Negative for notable central canal and foraminal stenosis in patient with a somewhat congenitally narrowed central canal.     Plantar fasciitis, bilateral    Prediabetes    Seasonal allergies    Past Surgical History:  Procedure Laterality Date   GASTRIC RESTRICTION SURGERY  1980    KNEE SURGERY Right 2007, 2009   Had cyst removed   [place] W/ BILATERAL SALPINGOOPHORECTOMY  03/2010   because of symptomatic fibroids, Dr. Mora Bellman   Patient Active Problem List   Diagnosis Date Noted   Elevated alkaline phosphatase measurement 09/20/2021   Hypercalcemia 09/08/2021   Obesity 09/08/2021   Prediabetes 05/39/7673   Systolic murmur 41/93/7902   Hypertension 01/02/2018   Bilateral rotator cuff dysfunction 07/19/2016   Chronic back pain 08/03/2015   Malaise 08/03/2015   Paresthesias 08/03/2015   Healthcare maintenance 08/03/2015   History of colonic polyps 08/03/2015   Hyperlipidemia    Depression with anxiety 02/21/2011   Osteoarthritis 02/21/2011   Hypothyroidism 05/06/2006    REFERRING DIAG: M54.42,M54.41,G89.29 (ICD-10-CM) - Chronic bilateral low back pain with bilateral sciatica  THERAPY DIAG:  Other low back pain  Muscle weakness (generalized)  Other abnormalities of gait and mobility  Rationale for Evaluation and Treatment Rehabilitation  PERTINENT HISTORY: HTN, DM II  PRECAUTIONS: None  SUBJECTIVE: Patient reports continued high level of LBP and she said she was on her feet a lot yesterday.   PAIN:  Are you having pain?  Yes: NPRS scale: 8/10 (9/10 at worst) Pain location: lower back; posterior hip Pain description: dull ache Aggravating factors: prolonged standing, walking Relieving factors: medication   OBJECTIVE: (objective measures completed at initial evaluation unless otherwise dated)   DIAGNOSTIC  FINDINGS:   CLINICAL DATA:  Chronic low back pain radiating to the buttocks and down the legs with associated weakness and numbness.   EXAM: MRI LUMBAR SPINE WITHOUT CONTRAST   TECHNIQUE: Multiplanar, multisequence MR imaging of the lumbar spine was performed. No intravenous contrast was administered.   COMPARISON:  MR lumbar 06/12/2007.   FINDINGS: Segmentation:   Standard.   Alignment: New facet mediated anterolisthesis of L4 on L5 measuring 2 mm.   Vertebrae: No fracture, suspicious marrow lesion, or significant marrow edema. Hemangioma in the T11 vertebral body.   Conus medullaris and cauda equina: Conus extends to the L1-2 level. Conus and cauda equina appear normal.   Paraspinal and other soft tissues: Unremarkable.   Disc levels:   T12-L1: Negative.   L1-2: Normal disc. Mild facet and ligamentum flavum hypertrophy without stenosis.   L2-3: New disc desiccation. Moderate facet and ligamentum flavum hypertrophy without disc herniation or significant stenosis.   L3-4: Disc desiccation. Disc bulging, congenitally short pedicles, and moderate facet and ligamentum flavum hypertrophy result in new mild spinal stenosis without significant neural foraminal stenosis.   L4-5: Disc desiccation. Anterolisthesis with disc uncovering and moderate to severe right greater than left facet hypertrophy without significant stenosis.   L5-S1: A left central to left foraminal disc protrusion and moderate facet hypertrophy result in new mild left lateral recess and mild left neural foraminal stenosis without spinal stenosis.   IMPRESSION: 1. Mildly progressive lumbar spondylosis and advanced facet arthrosis. 2. New mild spinal stenosis at L3-4. 3. New mild left lateral recess and left neural foraminal stenosis at L5-S1. 4. Advanced facet arthrosis at L4-5 with new grade 1 anterolisthesis but no stenosis.      PATIENT SURVEYS:  FOTO 43% function; 56% predicted   COGNITION:           Overall cognitive status: Within functional limits for tasks assessed                          SENSATION: WFL   MUSCLE LENGTH: Hamstrings: Right DNT deg; Left DNT deg   POSTURE:  Large body habitus, increased lumbar lordosis, slumped sitting posture   PALPATION: TTP to bilateral piriformis and L lumbar paraspinals   LE MMT:   MMT Right 10/04/2021  Left 10/04/2021  Hip flexion  5/5 5/5  Hip extension      Hip abduction 4/5 4/5  Hip adduction 5/5 5/5  Hip external rotation      Hip internal rotation      Knee extension 5/5 5/5  Knee flexion 5/5 5/5  Ankle dorsiflexion       Ankle plantarflexion      Ankle inversion      Ankle eversion      Grossly      (Blank rows = not tested)    LUMBAR SPECIAL TESTS:             SLR: Left Positive   FUNCTIONAL TESTS:  30 Second Sit to Stand: 13 reps   GAIT: Distance walked: 66f Assistive device utilized: None Level of assistance: Complete Independence Comments: no overt gait deviations   TODAY'S TREATMENT  [facility] Adult PT Treatment:                                                DATE: 10/16/2021 Therapeutic Exercise:  Nustep level 5 x 5 mins Standing marching 2x10 BIL Standing hip abduction 2x10 BIL Standing hip extension 2x10 BIL Supine PPT 5" hold 2x10 Supine PPT with marching 2x10 BIL Supine bridges 2x10 Supine clamshells GTB 2x10 SLR 2x10 BIL LTR x10 BIL Sidelying hip abduction 2x10 BIL STS x10 (no UE support)   [facility] Adult PT Treatment:                                                DATE: 10/04/2021 Therapeutic Exercise: Supine PPT x 5 - 5" hold Supine SLR x 5 each S/L clamshell x 5 GTB STS x 10 - no UE support   PATIENT EDUCATION:  Education details: eval findings, FOTO, HEP, POC Person educated: Patient Education method: Explanation, Demonstration, and Handouts Education comprehension: verbalized understanding and returned demonstration     HOME EXERCISE PROGRAM: Access Code: [email or web address] URL: [email or web address] Date: 10/04/2021 Prepared by: Octavio Manns   Exercises - Supine Posterior Pelvic Tilt  - 1 x daily - 7 x weekly - 2 sets - 10 reps - Supine Active Straight Leg Raise  - 1 x daily - 7 x weekly - 3 sets - 10 reps - Clamshell with Resistance  - 1 x daily - 7 x weekly - 3 sets - 10 reps - Sit to Stand Without Arm Support  - 1 x daily - 7 x  weekly - 3 sets - 10 reps   ASSESSMENT:   CLINICAL IMPRESSION: Patient presents to PT with continued high levels of LBP and reports HEP compliance. Session today focused on proximal hip and core strengthening. Patient was able to tolerate all prescribed exercises with no adverse effects. Patient continues to benefit from skilled PT services and should be progressed as able to improve functional independence.      OBJECTIVE IMPAIRMENTS decreased activity tolerance, decreased balance, difficulty walking, decreased ROM, decreased strength, and pain.    ACTIVITY LIMITATIONS cleaning, community activity, yard work, and recreational activity .    PERSONAL FACTORS Time since onset of injury/illness/exacerbation and 1-2 comorbidities: HTN, DM II  are also affecting patient's functional outcome.      REHAB POTENTIAL: Excellent   CLINICAL DECISION MAKING: Stable/uncomplicated   EVALUATION COMPLEXITY: Low     GOALS: Goals reviewed with patient? No   SHORT TERM GOALS: Target date: 10/25/2021     Pt will be compliant and knowledgeable with initial HEP for improved comfort and carryover Baseline: initial HEP given  Goal status: INITIAL   2.  Pt will self report lower back pain no greater than 6/10 for improved comfort and functional ability Baseline: 9/10 at worst Goal status: INITIAL   LONG TERM GOALS: Target date: 11/29/2021     Pt will self report lower back pain no greater than 2/10 for improved comfort and functional ability Baseline: 9/10 at wors Goal status: INITIAL   2.  Pt will increase 30 Second Sit to Stand rep count to no less than 15 reps for improved balance, strength, and functional mobility Baseline: 13 reps  Goal status: INITIAL   3.  Pt will improve FOTO function score to no less than 56% as proxy for functional improvement Baseline: 43% function Goal status: INITIAL   4.  Pt will be able to ambulate 1-2 miles without increase in lower back pain in order to improve  function and get back to desired  recreationa lactivity Baseline: unable Goal status: INITIAL   PLAN: PT FREQUENCY: 1-2x/week   PT DURATION: 8 weeks   PLANNED INTERVENTIONS: Therapeutic exercises, Therapeutic activity, Neuromuscular re-education, Balance training, Gait training, Patient/Family education, Joint mobilization, Aquatic Therapy, Dry Needling, Electrical stimulation, and Manual therapy.   PLAN FOR NEXT SESSION: assess HEP response, progress core and proximal hip strength    Evelene Croon, PTA 10/16/2021, 9:41 AM

## 2021-10-24 NOTE — Therapy (Signed)
OUTPATIENT PHYSICAL THERAPY TREATMENT NOTE   Patient Name: Joanna Gilbert MRN: [id number] DOB:Dec 22, 1950, 71 y.o., female Today's Date: 10/25/2021  PCP: Velna Ochs, MD REFERRING PROVIDER: Velna Ochs, MD  END OF SESSION:   PT End of Session - 10/25/21 1041     Visit Number 3    Number of Visits 17    Date for PT Re-Evaluation 11/29/21    Authorization Type [employer] Medicare    PT Start Time 1042    PT Stop Time 1122    PT Time Calculation (min) 40 min    Activity Tolerance Patient tolerated treatment well    Behavior During Therapy [facility] for tasks assessed/performed              Past Medical History:  Diagnosis Date   Anxiety    Depression 02/21/2011   Diverticulosis    Fibroid uterus 05/06/2006   Nov 2011: total abdominal hysterectomy with b/l salpingoopherectomy by Dr. Mora Bellman for symptomatic fibroids.  Qualifier: Diagnosis of  By: [name] MD, Yuri     History of colonic polyps    Hyperlipidemia    Hypertension    Hypothyroidism    Osteoarthritis 02/21/2011   MR b/l Hips (2009): Mild to moderate bilateral hip osteoarthritis, more notable on the right, with likely associated small area of anterior acetabular labral tearing.  MR R knee (2009): Dominant finding is marked interval progression in lateral compartment degenerative disease with new tearing in the posterior horn of the lateral meniscus. Persistent perimeniscal cyst formation related to lateral meniscal tearing as describe above MR Lumbar spine (2009) : 1. Dominant finding is advanced facet degenerative change L4-5 with small facet joint effusions and bony edema about the joints. This could be a source of back pain.  2. Negative for notable central canal and foraminal stenosis in patient with a somewhat congenitally narrowed central canal.     Plantar fasciitis, bilateral    Prediabetes    Seasonal allergies    Past Surgical History:  Procedure Laterality Date   GASTRIC RESTRICTION SURGERY  1980    KNEE SURGERY Right 2007, 2009   Had cyst removed   [place] W/ BILATERAL SALPINGOOPHORECTOMY  03/2010   because of symptomatic fibroids, Dr. Mora Bellman   Patient Active Problem List   Diagnosis Date Noted   Elevated alkaline phosphatase measurement 09/20/2021   Hypercalcemia 09/08/2021   Obesity 09/08/2021   Prediabetes 24/23/5361   Systolic murmur 44/31/5400   Hypertension 01/02/2018   Bilateral rotator cuff dysfunction 07/19/2016   Chronic back pain 08/03/2015   Malaise 08/03/2015   Paresthesias 08/03/2015   Healthcare maintenance 08/03/2015   History of colonic polyps 08/03/2015   Hyperlipidemia    Depression with anxiety 02/21/2011   Osteoarthritis 02/21/2011   Hypothyroidism 05/06/2006    REFERRING DIAG: M54.42,M54.41,G89.29 (ICD-10-CM) - Chronic bilateral low back pain with bilateral sciatica  THERAPY DIAG:  Other low back pain  Muscle weakness (generalized)  Other abnormalities of gait and mobility  Rationale for Evaluation and Treatment Rehabilitation  PERTINENT HISTORY: HTN, DM II  PRECAUTIONS: None  SUBJECTIVE: Patient reports that her back is still hurting and that her R knee is also beginning to hurt.   PAIN:  Are you having pain?  Yes: NPRS scale: 8/10 (9/10 at worst) Pain location: lower back; posterior hip Pain description: dull ache Aggravating factors: prolonged standing, walking Relieving factors: medication   OBJECTIVE: (objective measures completed at initial evaluation unless otherwise dated)  DIAGNOSTIC FINDINGS:   CLINICAL DATA:  Chronic low back pain radiating to the buttocks and down the legs with associated weakness and numbness.   EXAM: MRI LUMBAR SPINE WITHOUT CONTRAST   TECHNIQUE: Multiplanar, multisequence MR imaging of the lumbar spine was performed. No intravenous contrast was administered.   COMPARISON:  MR lumbar 06/12/2007.   FINDINGS: Segmentation:   Standard.   Alignment: New facet mediated anterolisthesis of L4 on L5 measuring 2 mm.   Vertebrae: No fracture, suspicious marrow lesion, or significant marrow edema. Hemangioma in the T11 vertebral body.   Conus medullaris and cauda equina: Conus extends to the L1-2 level. Conus and cauda equina appear normal.   Paraspinal and other soft tissues: Unremarkable.   Disc levels:   T12-L1: Negative.   L1-2: Normal disc. Mild facet and ligamentum flavum hypertrophy without stenosis.   L2-3: New disc desiccation. Moderate facet and ligamentum flavum hypertrophy without disc herniation or significant stenosis.   L3-4: Disc desiccation. Disc bulging, congenitally short pedicles, and moderate facet and ligamentum flavum hypertrophy result in new mild spinal stenosis without significant neural foraminal stenosis.   L4-5: Disc desiccation. Anterolisthesis with disc uncovering and moderate to severe right greater than left facet hypertrophy without significant stenosis.   L5-S1: A left central to left foraminal disc protrusion and moderate facet hypertrophy result in new mild left lateral recess and mild left neural foraminal stenosis without spinal stenosis.   IMPRESSION: 1. Mildly progressive lumbar spondylosis and advanced facet arthrosis. 2. New mild spinal stenosis at L3-4. 3. New mild left lateral recess and left neural foraminal stenosis at L5-S1. 4. Advanced facet arthrosis at L4-5 with new grade 1 anterolisthesis but no stenosis.      PATIENT SURVEYS:  FOTO 43% function; 56% predicted   COGNITION:           Overall cognitive status: Within functional limits for tasks assessed                          SENSATION: WFL   MUSCLE LENGTH: Hamstrings: Right DNT deg; Left DNT deg   POSTURE:  Large body habitus, increased lumbar lordosis, slumped sitting posture   PALPATION: TTP to bilateral piriformis and L lumbar paraspinals   LE MMT:   MMT Right 10/04/2021  Left 10/04/2021  Hip flexion  5/5 5/5  Hip extension      Hip abduction 4/5 4/5  Hip adduction 5/5 5/5  Hip external rotation      Hip internal rotation      Knee extension 5/5 5/5  Knee flexion 5/5 5/5  Ankle dorsiflexion       Ankle plantarflexion      Ankle inversion      Ankle eversion      Grossly      (Blank rows = not tested)    LUMBAR SPECIAL TESTS:             SLR: Left Positive   FUNCTIONAL TESTS:  30 Second Sit to Stand: 13 reps   GAIT: Distance walked: 2f Assistive device utilized: None Level of assistance: Complete Independence Comments: no overt gait deviations   TODAY'S TREATMENT  [facility] Adult PT Treatment:                                                DATE: 10/25/2021 Therapeutic  Exercise: Nustep level 6 x 5 mins Standing marching 2# 2x10 BIL Standing hip abduction 2# 2x10 BIL Standing hip extension 2# 2x10 BIL Supine PPT 5" hold 2x10 Supine PPT with marching 2x10 BIL Supine bridges 2x10 Supine clamshells GTB 2x10 SLR 2x10 BIL LTR x10 BIL Sidelying hip abduction 2x10 BIL   [facility] Adult PT Treatment:                                                DATE: 10/16/2021 Therapeutic Exercise: Nustep level 5 x 5 mins Standing marching 2x10 BIL Standing hip abduction 2x10 BIL Standing hip extension 2x10 BIL Supine PPT 5" hold 2x10 Supine PPT with marching 2x10 BIL Supine bridges 2x10 Supine clamshells GTB 2x10 SLR 2x10 BIL LTR x10 BIL Sidelying hip abduction 2x10 BIL STS x10 (no UE support)   [facility] Adult PT Treatment:                                                DATE: 10/04/2021 Therapeutic Exercise: Supine PPT x 5 - 5" hold Supine SLR x 5 each S/L clamshell x 5 GTB STS x 10 - no UE support   PATIENT EDUCATION:  Education details: eval findings, FOTO, HEP, POC Person educated: Patient Education method: Explanation, Demonstration, and Handouts Education comprehension: verbalized understanding and returned demonstration     HOME EXERCISE  PROGRAM: Access Code: [email or web address] URL: [email or web address] Date: 10/04/2021 Prepared by: Octavio Manns   Exercises - Supine Posterior Pelvic Tilt  - 1 x daily - 7 x weekly - 2 sets - 10 reps - Supine Active Straight Leg Raise  - 1 x daily - 7 x weekly - 3 sets - 10 reps - Clamshell with Resistance  - 1 x daily - 7 x weekly - 3 sets - 10 reps - Sit to Stand Without Arm Support  - 1 x daily - 7 x weekly - 3 sets - 10 reps   ASSESSMENT:   CLINICAL IMPRESSION: Patient presents to PT with continued high levels of LBP and reports HEP compliance. She also reports that her R knee has begun to hurt lately. Session today focused on core and proximal hip strengthening. Patient was able to tolerate all prescribed exercises with no adverse effects. Patient continues to benefit from skilled PT services and should be progressed as able to improve functional independence.   OBJECTIVE IMPAIRMENTS decreased activity tolerance, decreased balance, difficulty walking, decreased ROM, decreased strength, and pain.    ACTIVITY LIMITATIONS cleaning, community activity, yard work, and recreational activity .    PERSONAL FACTORS Time since onset of injury/illness/exacerbation and 1-2 comorbidities: HTN, DM II  are also affecting patient's functional outcome.      REHAB POTENTIAL: Excellent   CLINICAL DECISION MAKING: Stable/uncomplicated   EVALUATION COMPLEXITY: Low     GOALS: Goals reviewed with patient? No   SHORT TERM GOALS: Target date: 10/25/2021     Pt will be compliant and knowledgeable with initial HEP for improved comfort and carryover Baseline: initial HEP given  Goal status: MET 10/25/2021   2.  Pt will self report lower back pain no greater than 6/10 for improved comfort and functional ability Baseline: 9/10 at worst (5/31 - 8/10 LBP) Goal status: Ongoing  LONG TERM GOALS: Target date: 11/29/2021     Pt will self report lower back pain no greater than 2/10 for improved comfort  and functional ability Baseline: 9/10 at wors Goal status: INITIAL   2.  Pt will increase 30 Second Sit to Stand rep count to no less than 15 reps for improved balance, strength, and functional mobility Baseline: 13 reps  Goal status: INITIAL   3.  Pt will improve FOTO function score to no less than 56% as proxy for functional improvement Baseline: 43% function Goal status: INITIAL   4.  Pt will be able to ambulate 1-2 miles without increase in lower back pain in order to improve function and get back to desired recreationa lactivity Baseline: unable Goal status: INITIAL   PLAN: PT FREQUENCY: 1-2x/week   PT DURATION: 8 weeks   PLANNED INTERVENTIONS: Therapeutic exercises, Therapeutic activity, Neuromuscular re-education, Balance training, Gait training, Patient/Family education, Joint mobilization, Aquatic Therapy, Dry Needling, Electrical stimulation, and Manual therapy.   PLAN FOR NEXT SESSION: update HEP PRN, progress core and proximal hip strength    Evelene Croon, PTA 10/25/2021, 11:20 AM

## 2021-10-25 ENCOUNTER — Ambulatory Visit: Payer: Medicare Other

## 2021-10-25 DIAGNOSIS — R2689 Other abnormalities of gait and mobility: Secondary | ICD-10-CM

## 2021-10-25 DIAGNOSIS — M5459 Other low back pain: Secondary | ICD-10-CM | POA: Diagnosis not present

## 2021-10-25 DIAGNOSIS — M6281 Muscle weakness (generalized): Secondary | ICD-10-CM

## 2021-10-31 ENCOUNTER — Ambulatory Visit (HOSPITAL_BASED_OUTPATIENT_CLINIC_OR_DEPARTMENT_OTHER): Payer: Medicare Other | Admitting: Physical Therapy

## 2021-11-01 ENCOUNTER — Ambulatory Visit: Payer: Medicare Other

## 2021-11-01 ENCOUNTER — Ambulatory Visit (INDEPENDENT_AMBULATORY_CARE_PROVIDER_SITE_OTHER): Payer: Medicare Other

## 2021-11-01 DIAGNOSIS — M2141 Flat foot [pes planus] (acquired), right foot: Secondary | ICD-10-CM

## 2021-11-01 DIAGNOSIS — E1169 Type 2 diabetes mellitus with other specified complication: Secondary | ICD-10-CM

## 2021-11-01 DIAGNOSIS — M2142 Flat foot [pes planus] (acquired), left foot: Secondary | ICD-10-CM | POA: Diagnosis not present

## 2021-11-01 NOTE — Progress Notes (Signed)
SITUATION Reason for Visit: Fitting of Diabetic Shoes & Insoles Patient / Caregiver Report:  Patient is satisfied with fit and function of shoes and insoles.  OBJECTIVE DATA: Patient History / Diagnosis:     ICD-10-CM   1. Type 2 diabetes mellitus with other specified complication, without long-term current use of insulin (HCC)  E11.69     2. Pes planus of both feet  M21.41    M21.42       Change in Status:   None  ACTIONS PERFORMED: In-Person Delivery, patient was fit with: - 1x pair A5500 PDAC approved prefabricated Diabetic Shoes: A3200W 9.5W - 3x pair [id number] PDAC approved vacuum formed custom diabetic insoles; [employer]: [id number]  Shoes and insoles were verified for structural integrity and safety. Patient wore shoes and insoles in office. Skin was inspected and free of areas of concern after wearing shoes and inserts. Shoes and inserts fit properly. Patient / Caregiver provided with ferbal instruction and demonstration regarding donning, doffing, wear, care, proper fit, function, purpose, cleaning, and use of shoes and insoles ' and in all related precautions and risks and benefits regarding shoes and insoles. Patient / Caregiver was instructed to wear properly fitting socks with shoes at all times. Patient was also provided with verbal instruction regarding how to report any failures or malfunctions of shoes or inserts, and necessary follow up care. Patient / Caregiver was also instructed to contact physician regarding change in status that may affect function of shoes and inserts.   Patient / Caregiver verbalized undersatnding of instruction provided. Patient / Caregiver demonstrated independence with proper donning and doffing of shoes and inserts.  PLAN Patient to follow with treating physician as recommended. Plan of care was discussed with and agreed upon by patient and/or caregiver. All questions were answered and concerns addressed.

## 2021-11-07 ENCOUNTER — Other Ambulatory Visit: Payer: Self-pay | Admitting: Internal Medicine

## 2021-11-07 ENCOUNTER — Ambulatory Visit: Payer: Medicare Other

## 2021-11-07 DIAGNOSIS — I10 Essential (primary) hypertension: Secondary | ICD-10-CM

## 2021-11-07 NOTE — Telephone Encounter (Signed)
Please have patient schedule follow up for her BP. Approved 90 day refill.

## 2021-11-07 NOTE — Telephone Encounter (Signed)
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

## 2021-11-10 ENCOUNTER — Ambulatory Visit (HOSPITAL_BASED_OUTPATIENT_CLINIC_OR_DEPARTMENT_OTHER): Payer: Medicare Other | Admitting: Physical Therapy

## 2021-11-14 ENCOUNTER — Ambulatory Visit: Payer: Medicare Other | Attending: Internal Medicine

## 2021-11-14 DIAGNOSIS — R2689 Other abnormalities of gait and mobility: Secondary | ICD-10-CM | POA: Insufficient documentation

## 2021-11-14 DIAGNOSIS — M5459 Other low back pain: Secondary | ICD-10-CM | POA: Insufficient documentation

## 2021-11-14 DIAGNOSIS — M6281 Muscle weakness (generalized): Secondary | ICD-10-CM | POA: Diagnosis present

## 2021-11-14 NOTE — Therapy (Signed)
OUTPATIENT PHYSICAL THERAPY TREATMENT NOTE   Patient Name: Joanna Gilbert MRN: [id number] DOB:Dec 13, 1950, 71 y.o., female Today's Date: 11/14/2021  PCP: Velna Ochs, MD REFERRING PROVIDER: Velna Ochs, MD  END OF SESSION:   PT End of Session - 11/14/21 0930     Visit Number 4    Number of Visits 17    Date for PT Re-Evaluation 11/29/21    Authorization Type [employer] Medicare    PT Start Time 0928   Pt arrived 13 mins late to appt   PT Stop Time 1000    PT Time Calculation (min) 32 min               Past Medical History:  Diagnosis Date   Anxiety    Depression 02/21/2011   Diverticulosis    Fibroid uterus 05/06/2006   Nov 2011: total abdominal hysterectomy with b/l salpingoopherectomy by Dr. Mora Bellman for symptomatic fibroids.  Qualifier: Diagnosis of  By: [name] MD, Yuri     History of colonic polyps    Hyperlipidemia    Hypertension    Hypothyroidism    Osteoarthritis 02/21/2011   MR b/l Hips (2009): Mild to moderate bilateral hip osteoarthritis, more notable on the right, with likely associated small area of anterior acetabular labral tearing.  MR R knee (2009): Dominant finding is marked interval progression in lateral compartment degenerative disease with new tearing in the posterior horn of the lateral meniscus. Persistent perimeniscal cyst formation related to lateral meniscal tearing as describe above MR Lumbar spine (2009) : 1. Dominant finding is advanced facet degenerative change L4-5 with small facet joint effusions and bony edema about the joints. This could be a source of back pain.  2. Negative for notable central canal and foraminal stenosis in patient with a somewhat congenitally narrowed central canal.     Plantar fasciitis, bilateral    Prediabetes    Seasonal allergies    Past Surgical History:  Procedure Laterality Date   GASTRIC RESTRICTION SURGERY  1980   KNEE SURGERY Right 2007, 2009   Had cyst removed   [place] W/ BILATERAL SALPINGOOPHORECTOMY  03/2010   because of symptomatic fibroids, Dr. Mora Bellman   Patient Active Problem List   Diagnosis Date Noted   Elevated alkaline phosphatase measurement 09/20/2021   Hypercalcemia 09/08/2021   Obesity 09/08/2021   Prediabetes 40/98/1191   Systolic murmur 47/82/9562   Hypertension 01/02/2018   Bilateral rotator cuff dysfunction 07/19/2016   Chronic back pain 08/03/2015   Malaise 08/03/2015   Paresthesias 08/03/2015   Healthcare maintenance 08/03/2015   History of colonic polyps 08/03/2015   Hyperlipidemia    Depression with anxiety 02/21/2011   Osteoarthritis 02/21/2011   Hypothyroidism 05/06/2006    REFERRING DIAG: M54.42,M54.41,G89.29 (ICD-10-CM) - Chronic bilateral low back pain with bilateral sciatica  THERAPY DIAG:  Other low back pain  Muscle weakness (generalized)  Other abnormalities of gait and mobility  Rationale for Evaluation and Treatment Rehabilitation  PERTINENT HISTORY: HTN, DM II  PRECAUTIONS: None  SUBJECTIVE: Patient reports continued low back pain and believes she "overstretched" and has some pain in R ribs today.   PAIN:  Are you having pain?  Yes: NPRS scale: 8/10 (9/10 at worst) Pain location: lower back; posterior hip Pain description: dull ache Aggravating factors: prolonged standing, walking Relieving factors: medication   OBJECTIVE: (objective measures completed at initial evaluation unless otherwise dated)   DIAGNOSTIC FINDINGS:   CLINICAL DATA:  Chronic low back  pain radiating to the buttocks and down the legs with associated weakness and numbness.   EXAM: MRI LUMBAR SPINE WITHOUT CONTRAST   TECHNIQUE: Multiplanar, multisequence MR imaging of the lumbar spine was performed. No intravenous contrast was administered.   COMPARISON:  MR lumbar 06/12/2007.   FINDINGS: Segmentation:  Standard.   Alignment: New facet mediated anterolisthesis of L4 on  L5 measuring 2 mm.   Vertebrae: No fracture, suspicious marrow lesion, or significant marrow edema. Hemangioma in the T11 vertebral body.   Conus medullaris and cauda equina: Conus extends to the L1-2 level. Conus and cauda equina appear normal.   Paraspinal and other soft tissues: Unremarkable.   Disc levels:   T12-L1: Negative.   L1-2: Normal disc. Mild facet and ligamentum flavum hypertrophy without stenosis.   L2-3: New disc desiccation. Moderate facet and ligamentum flavum hypertrophy without disc herniation or significant stenosis.   L3-4: Disc desiccation. Disc bulging, congenitally short pedicles, and moderate facet and ligamentum flavum hypertrophy result in new mild spinal stenosis without significant neural foraminal stenosis.   L4-5: Disc desiccation. Anterolisthesis with disc uncovering and moderate to severe right greater than left facet hypertrophy without significant stenosis.   L5-S1: A left central to left foraminal disc protrusion and moderate facet hypertrophy result in new mild left lateral recess and mild left neural foraminal stenosis without spinal stenosis.   IMPRESSION: 1. Mildly progressive lumbar spondylosis and advanced facet arthrosis. 2. New mild spinal stenosis at L3-4. 3. New mild left lateral recess and left neural foraminal stenosis at L5-S1. 4. Advanced facet arthrosis at L4-5 with new grade 1 anterolisthesis but no stenosis.      PATIENT SURVEYS:  FOTO 43% function; 56% predicted   COGNITION:           Overall cognitive status: Within functional limits for tasks assessed                          SENSATION: WFL   MUSCLE LENGTH: Hamstrings: Right DNT deg; Left DNT deg   POSTURE:  Large body habitus, increased lumbar lordosis, slumped sitting posture   PALPATION: TTP to bilateral piriformis and L lumbar paraspinals   LE MMT:   MMT Right 10/04/2021 Left 10/04/2021  Hip flexion  5/5 5/5  Hip extension      Hip abduction  4/5 4/5  Hip adduction 5/5 5/5  Hip external rotation      Hip internal rotation      Knee extension 5/5 5/5  Knee flexion 5/5 5/5  Ankle dorsiflexion       Ankle plantarflexion      Ankle inversion      Ankle eversion      Grossly      (Blank rows = not tested)    LUMBAR SPECIAL TESTS:             SLR: Left Positive   FUNCTIONAL TESTS:  30 Second Sit to Stand: 13 reps   GAIT: Distance walked: 49f Assistive device utilized: None Level of assistance: Complete Independence Comments: no overt gait deviations   TODAY'S TREATMENT  [facility] Adult PT Treatment:                                                DATE: 11/14/2021 Therapeutic Exercise: Nustep level 6 x 3 mins Standing marching 2#  2x10 BIL Standing hip abduction 2# 2x10 BIL Standing hip extension 2# 2x10 BIL Supine bridges 2x10 Supine clamshells GTB 2x10 SLR 2x10 BIL LTR x10 BIL Sidelying hip abduction x10 BIL STS no UE support x10 Pball roll out forward/lateral x10 each   [facility] Adult PT Treatment:                                                DATE: 10/25/2021 Therapeutic Exercise: Nustep level 6 x 5 mins Standing marching 2# 2x10 BIL Standing hip abduction 2# 2x10 BIL Standing hip extension 2# 2x10 BIL Supine PPT 5" hold 2x10 Supine PPT with marching 2x10 BIL Supine bridges 2x10 Supine clamshells GTB 2x10 SLR 2x10 BIL LTR x10 BIL Sidelying hip abduction 2x10 BIL   [facility] Adult PT Treatment:                                                DATE: 10/16/2021 Therapeutic Exercise: Nustep level 5 x 5 mins Standing marching 2x10 BIL Standing hip abduction 2x10 BIL Standing hip extension 2x10 BIL Supine PPT 5" hold 2x10 Supine PPT with marching 2x10 BIL Supine bridges 2x10 Supine clamshells GTB 2x10 SLR 2x10 BIL LTR x10 BIL Sidelying hip abduction 2x10 BIL STS x10 (no UE support)    PATIENT EDUCATION:  Education details: eval findings, FOTO, HEP, POC Person educated: Patient Education method: Explanation,  Demonstration, and Handouts Education comprehension: verbalized understanding and returned demonstration     HOME EXERCISE PROGRAM: Access Code: [email or web address] URL: [email or web address] Date: 10/04/2021 Prepared by: Octavio Manns   Exercises - Supine Posterior Pelvic Tilt  - 1 x daily - 7 x weekly - 2 sets - 10 reps - Supine Active Straight Leg Raise  - 1 x daily - 7 x weekly - 3 sets - 10 reps - Clamshell with Resistance  - 1 x daily - 7 x weekly - 3 sets - 10 reps - Sit to Stand Without Arm Support  - 1 x daily - 7 x weekly - 3 sets - 10 reps Added 11/14/2021 - Supine Lower Trunk Rotation  - 1 x daily - 7 x weekly - 1 sets - 10 reps - 10 hold - Seated Flexion Stretch with Swiss Ball  - 1 x daily - 7 x weekly - 1 sets - 10 reps - 10 sec hold   ASSESSMENT:   CLINICAL IMPRESSION: Patient arrived 13 minutes late to appointment, delaying treatment time. Session today focused on core and proximal hip strengthening. Updated HEP to include stretches for the lower back with patient demonstrating understanding. Patient was able to tolerate all prescribed exercises with no adverse effects. Patient continues to benefit from skilled PT services and should be progressed as able to improve functional independence.    OBJECTIVE IMPAIRMENTS decreased activity tolerance, decreased balance, difficulty walking, decreased ROM, decreased strength, and pain.    ACTIVITY LIMITATIONS cleaning, community activity, yard work, and recreational activity .    PERSONAL FACTORS Time since onset of injury/illness/exacerbation and 1-2 comorbidities: HTN, DM II  are also affecting patient's functional outcome.      REHAB POTENTIAL: Excellent   CLINICAL DECISION MAKING: Stable/uncomplicated   EVALUATION COMPLEXITY: Low     GOALS: Goals reviewed with patient? No  SHORT TERM GOALS: Target date: 10/25/2021     Pt will be compliant and knowledgeable with initial HEP for improved comfort and  carryover Baseline: initial HEP given  Goal status: MET 10/25/2021   2.  Pt will self report lower back pain no greater than 6/10 for improved comfort and functional ability Baseline: 9/10 at worst (5/31 - 8/10 LBP) Goal status: Ongoing   LONG TERM GOALS: Target date: 11/29/2021     Pt will self report lower back pain no greater than 2/10 for improved comfort and functional ability Baseline: 9/10 at wors Goal status: INITIAL   2.  Pt will increase 30 Second Sit to Stand rep count to no less than 15 reps for improved balance, strength, and functional mobility Baseline: 13 reps  Goal status: INITIAL   3.  Pt will improve FOTO function score to no less than 56% as proxy for functional improvement Baseline: 43% function Goal status: INITIAL   4.  Pt will be able to ambulate 1-2 miles without increase in lower back pain in order to improve function and get back to desired recreationa lactivity Baseline: unable Goal status: INITIAL   PLAN: PT FREQUENCY: 1-2x/week   PT DURATION: 8 weeks   PLANNED INTERVENTIONS: Therapeutic exercises, Therapeutic activity, Neuromuscular re-education, Balance training, Gait training, Patient/Family education, Joint mobilization, Aquatic Therapy, Dry Needling, Electrical stimulation, and Manual therapy.   PLAN FOR NEXT SESSION: update HEP PRN, progress core and proximal hip strength    Evelene Croon, PTA 11/14/2021, 9:30 AM

## 2021-11-16 NOTE — Therapy (Incomplete)
OUTPATIENT PHYSICAL THERAPY TREATMENT NOTE   Patient Name: Joanna Gilbert MRN: [id number] DOB:04/20/1951, 71 y.o., female Today's Date: 11/16/2021  PCP: Velna Ochs, MD REFERRING PROVIDER: Velna Ochs, MD  END OF SESSION:       Past Medical History:  Diagnosis Date   Anxiety    Depression 02/21/2011   Diverticulosis    Fibroid uterus 05/06/2006   Nov 2011: total abdominal hysterectomy with b/l salpingoopherectomy by Dr. Mora Bellman for symptomatic fibroids.  Qualifier: Diagnosis of  By: [name] MD, Yuri     History of colonic polyps    Hyperlipidemia    Hypertension    Hypothyroidism    Osteoarthritis 02/21/2011   MR b/l Hips (2009): Mild to moderate bilateral hip osteoarthritis, more notable on the right, with likely associated small area of anterior acetabular labral tearing.  MR R knee (2009): Dominant finding is marked interval progression in lateral compartment degenerative disease with new tearing in the posterior horn of the lateral meniscus. Persistent perimeniscal cyst formation related to lateral meniscal tearing as describe above MR Lumbar spine (2009) : 1. Dominant finding is advanced facet degenerative change L4-5 with small facet joint effusions and bony edema about the joints. This could be a source of back pain.  2. Negative for notable central canal and foraminal stenosis in patient with a somewhat congenitally narrowed central canal.     Plantar fasciitis, bilateral    Prediabetes    Seasonal allergies    Past Surgical History:  Procedure Laterality Date   GASTRIC RESTRICTION SURGERY  1980   KNEE SURGERY Right 2007, 2009   Had cyst removed   [place] W/ BILATERAL SALPINGOOPHORECTOMY  03/2010   because of symptomatic fibroids, Dr. Mora Bellman   Patient Active Problem List   Diagnosis Date Noted   Elevated alkaline phosphatase measurement 09/20/2021   Hypercalcemia 09/08/2021    Obesity 09/08/2021   Prediabetes 56/43/3295   Systolic murmur 18/84/1660   Hypertension 01/02/2018   Bilateral rotator cuff dysfunction 07/19/2016   Chronic back pain 08/03/2015   Malaise 08/03/2015   Paresthesias 08/03/2015   Healthcare maintenance 08/03/2015   History of colonic polyps 08/03/2015   Hyperlipidemia    Depression with anxiety 02/21/2011   Osteoarthritis 02/21/2011   Hypothyroidism 05/06/2006    REFERRING DIAG: M54.42,M54.41,G89.29 (ICD-10-CM) - Chronic bilateral low back pain with bilateral sciatica  THERAPY DIAG:  No diagnosis found.  Rationale for Evaluation and Treatment Rehabilitation  PERTINENT HISTORY: HTN, DM II  PRECAUTIONS: None  SUBJECTIVE: *** Patient reports continued low back pain and believes she "overstretched" and has some pain in R ribs today.   PAIN:  Are you having pain?  Yes: NPRS scale: ***/10 (9/10 at worst) Pain location: lower back; posterior hip Pain description: dull ache Aggravating factors: prolonged standing, walking Relieving factors: medication   OBJECTIVE: (objective measures completed at initial evaluation unless otherwise dated)   DIAGNOSTIC FINDINGS:   CLINICAL DATA:  Chronic low back pain radiating to the buttocks and down the legs with associated weakness and numbness.   EXAM: MRI LUMBAR SPINE WITHOUT CONTRAST   TECHNIQUE: Multiplanar, multisequence MR imaging of the lumbar spine was performed. No intravenous contrast was administered.   COMPARISON:  MR lumbar 06/12/2007.   FINDINGS: Segmentation:  Standard.   Alignment: New facet mediated anterolisthesis of L4 on L5 measuring 2 mm.   Vertebrae: No fracture, suspicious marrow lesion, or significant marrow edema. Hemangioma in the T11 vertebral body.   Conus  medullaris and cauda equina: Conus extends to the L1-2 level. Conus and cauda equina appear normal.   Paraspinal and other soft tissues: Unremarkable.   Disc levels:   T12-L1: Negative.    L1-2: Normal disc. Mild facet and ligamentum flavum hypertrophy without stenosis.   L2-3: New disc desiccation. Moderate facet and ligamentum flavum hypertrophy without disc herniation or significant stenosis.   L3-4: Disc desiccation. Disc bulging, congenitally short pedicles, and moderate facet and ligamentum flavum hypertrophy result in new mild spinal stenosis without significant neural foraminal stenosis.   L4-5: Disc desiccation. Anterolisthesis with disc uncovering and moderate to severe right greater than left facet hypertrophy without significant stenosis.   L5-S1: A left central to left foraminal disc protrusion and moderate facet hypertrophy result in new mild left lateral recess and mild left neural foraminal stenosis without spinal stenosis.   IMPRESSION: 1. Mildly progressive lumbar spondylosis and advanced facet arthrosis. 2. New mild spinal stenosis at L3-4. 3. New mild left lateral recess and left neural foraminal stenosis at L5-S1. 4. Advanced facet arthrosis at L4-5 with new grade 1 anterolisthesis but no stenosis.      PATIENT SURVEYS:  FOTO 43% function; 56% predicted   COGNITION:           Overall cognitive status: Within functional limits for tasks assessed                          SENSATION: WFL   MUSCLE LENGTH: Hamstrings: Right DNT deg; Left DNT deg   POSTURE:  Large body habitus, increased lumbar lordosis, slumped sitting posture   PALPATION: TTP to bilateral piriformis and L lumbar paraspinals   LE MMT:   MMT Right 10/04/2021 Left 10/04/2021  Hip flexion  5/5 5/5  Hip extension      Hip abduction 4/5 4/5  Hip adduction 5/5 5/5  Hip external rotation      Hip internal rotation      Knee extension 5/5 5/5  Knee flexion 5/5 5/5  Ankle dorsiflexion       Ankle plantarflexion      Ankle inversion      Ankle eversion      Grossly      (Blank rows = not tested)    LUMBAR SPECIAL TESTS:             SLR: Left Positive    FUNCTIONAL TESTS:  30 Second Sit to Stand: 13 reps   GAIT: Distance walked: 54f Assistive device utilized: None Level of assistance: Complete Independence Comments: no overt gait deviations   TODAY'S TREATMENT  Aquatic therapy at [address] - therapeutic pool temp *** degrees Pt enters building ***  Treatment took place in water 3.8 to  4 ft 8 in.feet deep depending upon activity.  Pt entered and exited the pool via stair and handrails ***  Pt pain level *** at initiation of water walking. Walking forward/backwards/side stepping Lunge stepping forwards x2 laps Side stepping lunge x2 laps Runners stretch on bottom step x30" BIL Hamstring stretch on bottom step x30" BIL Figure 4 squat stretch, BIL UE support 2x30" BIL Standing thoracic rotation with hands in neutral for resistance x10 BIL STS from 3rd step from bottom At edge of pool, pt performed LE exercise: Hip abd/add x20 BIL Hip ext/flex with knee straight x 20 BIL Hip Circles CC/CCW 2x10 each BIL Marching hip flexion to knee extension 2x10 BIL Hamstring curl x20 BIL Squats 2x20 Step ups on  submerged step 2x10 BIL Step up and overs on submerged step 2x10 BIL Sitting on bench in water: Bicycle kicks x1' Reverse bicycle kicks x1' Flutter kicks x1' Scissor kicks x1' Kickboard push/pull x1' Kickboard push downs x1'  Pt requires the buoyancy of water for active assisted exercises with buoyancy supported for strengthening and AROM exercises. Hydrostatic pressure also supports joints by unweighting joint load by at least 50 % in 3-4 feet depth water. 80% in chest to neck deep water. Water will provide assistance with movement using the current and laminar flow while the buoyancy reduces weight bearing. Pt requires the viscosity of the water for resistance with strengthening exercises.   [place] Adult PT Treatment:                                                DATE: 11/14/2021 Therapeutic Exercise: Nustep  level 6 x 3 mins Standing marching 2# 2x10 BIL Standing hip abduction 2# 2x10 BIL Standing hip extension 2# 2x10 BIL Supine bridges 2x10 Supine clamshells GTB 2x10 SLR 2x10 BIL LTR x10 BIL Sidelying hip abduction x10 BIL STS no UE support x10 Pball roll out forward/lateral x10 each   [facility] Adult PT Treatment:                                                DATE: 10/25/2021 Therapeutic Exercise: Nustep level 6 x 5 mins Standing marching 2# 2x10 BIL Standing hip abduction 2# 2x10 BIL Standing hip extension 2# 2x10 BIL Supine PPT 5" hold 2x10 Supine PPT with marching 2x10 BIL Supine bridges 2x10 Supine clamshells GTB 2x10 SLR 2x10 BIL LTR x10 BIL Sidelying hip abduction 2x10 BIL    PATIENT EDUCATION:  Education details: eval findings, FOTO, HEP, POC Person educated: Patient Education method: Explanation, Demonstration, and Handouts Education comprehension: verbalized understanding and returned demonstration     HOME EXERCISE PROGRAM: Access Code: [email or web address] URL: [email or web address] Date: 10/04/2021 Prepared by: Octavio Manns   Exercises - Supine Posterior Pelvic Tilt  - 1 x daily - 7 x weekly - 2 sets - 10 reps - Supine Active Straight Leg Raise  - 1 x daily - 7 x weekly - 3 sets - 10 reps - Clamshell with Resistance  - 1 x daily - 7 x weekly - 3 sets - 10 reps - Sit to Stand Without Arm Support  - 1 x daily - 7 x weekly - 3 sets - 10 reps Added 11/14/2021 - Supine Lower Trunk Rotation  - 1 x daily - 7 x weekly - 1 sets - 10 reps - 10 hold - Seated Flexion Stretch with Swiss Ball  - 1 x daily - 7 x weekly - 1 sets - 10 reps - 10 sec hold   ASSESSMENT:   CLINICAL IMPRESSION: ***  Patient arrived 13 minutes late to appointment, delaying treatment time. Session today focused on core and proximal hip strengthening. Updated HEP to include stretches for the lower back with patient demonstrating understanding. Patient was able to tolerate all prescribed exercises  with no adverse effects. Patient continues to benefit from skilled PT services and should be progressed as able to improve functional independence.    OBJECTIVE IMPAIRMENTS decreased activity tolerance, decreased balance, difficulty walking, decreased  ROM, decreased strength, and pain.    ACTIVITY LIMITATIONS cleaning, community activity, yard work, and recreational activity .    PERSONAL FACTORS Time since onset of injury/illness/exacerbation and 1-2 comorbidities: HTN, DM II  are also affecting patient's functional outcome.      REHAB POTENTIAL: Excellent   CLINICAL DECISION MAKING: Stable/uncomplicated   EVALUATION COMPLEXITY: Low     GOALS: Goals reviewed with patient? No   SHORT TERM GOALS: Target date: 10/25/2021     Pt will be compliant and knowledgeable with initial HEP for improved comfort and carryover Baseline: initial HEP given  Goal status: MET 10/25/2021   2.  Pt will self report lower back pain no greater than 6/10 for improved comfort and functional ability Baseline: 9/10 at worst (5/31 - 8/10 LBP) Goal status: Ongoing   LONG TERM GOALS: Target date: 11/29/2021     Pt will self report lower back pain no greater than 2/10 for improved comfort and functional ability Baseline: 9/10 at wors Goal status: INITIAL   2.  Pt will increase 30 Second Sit to Stand rep count to no less than 15 reps for improved balance, strength, and functional mobility Baseline: 13 reps  Goal status: INITIAL   3.  Pt will improve FOTO function score to no less than 56% as proxy for functional improvement Baseline: 43% function Goal status: INITIAL   4.  Pt will be able to ambulate 1-2 miles without increase in lower back pain in order to improve function and get back to desired recreationa lactivity Baseline: unable Goal status: INITIAL   PLAN: PT FREQUENCY: 1-2x/week   PT DURATION: 8 weeks   PLANNED INTERVENTIONS: Therapeutic exercises, Therapeutic activity, Neuromuscular  re-education, Balance training, Gait training, Patient/Family education, Joint mobilization, Aquatic Therapy, Dry Needling, Electrical stimulation, and Manual therapy.   PLAN FOR NEXT SESSION: update HEP PRN, progress core and proximal hip strength    Evelene Croon, PTA 11/16/2021, 12:27 PM

## 2021-11-17 ENCOUNTER — Ambulatory Visit: Payer: Medicare Other

## 2021-11-17 DIAGNOSIS — R2689 Other abnormalities of gait and mobility: Secondary | ICD-10-CM

## 2021-11-17 DIAGNOSIS — M5459 Other low back pain: Secondary | ICD-10-CM

## 2021-11-17 DIAGNOSIS — M6281 Muscle weakness (generalized): Secondary | ICD-10-CM

## 2021-11-18 NOTE — Therapy (Signed)
OUTPATIENT PHYSICAL THERAPY TREATMENT NOTE   Patient Name: Joanna Gilbert MRN: [id number] DOB:[birth year], 71 y.o., female Today's Date: 11/21/2021  PCP: Reymundo Poll, MD REFERRING PROVIDER: Reymundo Poll, MD  END OF SESSION:   PT End of Session - 11/21/21 0915     Visit Number 6    Number of Visits 17    Date for PT Re-Evaluation 11/29/21    Authorization Type [employer]    PT Start Time 0915    PT Stop Time 0955    PT Time Calculation (min) 40 min    Activity Tolerance Patient tolerated treatment well    Behavior During Therapy [facility] for tasks assessed/performed                 Past Medical History:  Diagnosis Date   Anxiety    Depression 02/21/2011   Diverticulosis    Fibroid uterus 05/06/2006   Nov 2011: total abdominal hysterectomy with b/l salpingoopherectomy by Dr. Catalina Antigua for symptomatic fibroids.  Qualifier: Diagnosis of  By: Luiz Iron MD, Yuri     History of colonic polyps    Hyperlipidemia    Hypertension    Hypothyroidism    Osteoarthritis 02/21/2011   MR b/l Hips (2009): Mild to moderate bilateral hip osteoarthritis, more notable on the right, with likely associated small area of anterior acetabular labral tearing.  MR R knee (2009): Dominant finding is marked interval progression in lateral compartment degenerative disease with new tearing in the posterior horn of the lateral meniscus. Persistent perimeniscal cyst formation related to lateral meniscal tearing as describe above MR Lumbar spine (2009) : 1. Dominant finding is advanced facet degenerative change L4-5 with small facet joint effusions and bony edema about the joints. This could be a source of back pain.  2. Negative for notable central canal and foraminal stenosis in patient with a somewhat congenitally narrowed central canal.     Plantar fasciitis, bilateral    Prediabetes    Seasonal allergies    Past Surgical History:  Procedure Laterality Date   GASTRIC RESTRICTION SURGERY   1980   KNEE SURGERY Right 2007, 2009   Had cyst removed   LAPAROSCOPIC CHOLECYSTECTOMY  1990   TOTAL ABDOMINAL HYSTERECTOMY W/ BILATERAL SALPINGOOPHORECTOMY  03/2010   because of symptomatic fibroids, Dr. Catalina Antigua   Patient Active Problem List   Diagnosis Date Noted   Elevated alkaline phosphatase measurement 09/20/2021   Hypercalcemia 09/08/2021   Obesity 09/08/2021   Prediabetes 09/08/2021   Systolic murmur 04/25/2020   Hypertension 01/02/2018   Bilateral rotator cuff dysfunction 07/19/2016   Chronic back pain 08/03/2015   Malaise 08/03/2015   Paresthesias 08/03/2015   Healthcare maintenance 08/03/2015   History of colonic polyps 08/03/2015   Hyperlipidemia    Depression with anxiety 02/21/2011   Osteoarthritis 02/21/2011   Hypothyroidism 05/06/2006    REFERRING DIAG: M54.42,M54.41,G89.29 (ICD-10-CM) - Chronic bilateral low back pain with bilateral sciatica  THERAPY DIAG:  Other low back pain  Muscle weakness (generalized)  Other abnormalities of gait and mobility  Rationale for Evaluation and Treatment Rehabilitation  PERTINENT HISTORY: HTN, DM II  PRECAUTIONS: None  SUBJECTIVE: Patient reports that she felt good after aquatic session last week. She states her R knee is bothering her today.  PAIN:  Are you having pain?  Yes: NPRS scale: 6/10 (9/10 at worst) Pain location: lower back; posterior hip Pain description: dull ache Aggravating factors: prolonged standing, walking Relieving factors: medication   OBJECTIVE: (objective measures completed at initial evaluation unless  otherwise dated)   DIAGNOSTIC FINDINGS:   CLINICAL DATA:  Chronic low back pain radiating to the buttocks and down the legs with associated weakness and numbness.   EXAM: MRI LUMBAR SPINE WITHOUT CONTRAST   TECHNIQUE: Multiplanar, multisequence MR imaging of the lumbar spine was performed. No intravenous contrast was administered.   COMPARISON:  MR lumbar 06/12/2007.    FINDINGS: Segmentation:  Standard.   Alignment: New facet mediated anterolisthesis of L4 on L5 measuring 2 mm.   Vertebrae: No fracture, suspicious marrow lesion, or significant marrow edema. Hemangioma in the T11 vertebral body.   Conus medullaris and cauda equina: Conus extends to the L1-2 level. Conus and cauda equina appear normal.   Paraspinal and other soft tissues: Unremarkable.   Disc levels:   T12-L1: Negative.   L1-2: Normal disc. Mild facet and ligamentum flavum hypertrophy without stenosis.   L2-3: New disc desiccation. Moderate facet and ligamentum flavum hypertrophy without disc herniation or significant stenosis.   L3-4: Disc desiccation. Disc bulging, congenitally short pedicles, and moderate facet and ligamentum flavum hypertrophy result in new mild spinal stenosis without significant neural foraminal stenosis.   L4-5: Disc desiccation. Anterolisthesis with disc uncovering and moderate to severe right greater than left facet hypertrophy without significant stenosis.   L5-S1: A left central to left foraminal disc protrusion and moderate facet hypertrophy result in new mild left lateral recess and mild left neural foraminal stenosis without spinal stenosis.   IMPRESSION: 1. Mildly progressive lumbar spondylosis and advanced facet arthrosis. 2. New mild spinal stenosis at L3-4. 3. New mild left lateral recess and left neural foraminal stenosis at L5-S1. 4. Advanced facet arthrosis at L4-5 with new grade 1 anterolisthesis but no stenosis.      PATIENT SURVEYS:  FOTO 43% function; 56% predicted   COGNITION:           Overall cognitive status: Within functional limits for tasks assessed                          SENSATION: WFL   MUSCLE LENGTH: Hamstrings: Right DNT deg; Left DNT deg   POSTURE:  Large body habitus, increased lumbar lordosis, slumped sitting posture   PALPATION: TTP to bilateral piriformis and L lumbar paraspinals   LE MMT:    MMT Right 10/04/2021 Left 10/04/2021  Hip flexion  5/5 5/5  Hip extension      Hip abduction 4/5 4/5  Hip adduction 5/5 5/5  Hip external rotation      Hip internal rotation      Knee extension 5/5 5/5  Knee flexion 5/5 5/5  Ankle dorsiflexion       Ankle plantarflexion      Ankle inversion      Ankle eversion      Grossly      (Blank rows = not tested)    LUMBAR SPECIAL TESTS:             SLR: Left Positive   FUNCTIONAL TESTS:  30 Second Sit to Stand: 13 reps   GAIT: Distance walked: 15ft Assistive device utilized: None Level of assistance: Complete Independence Comments: no overt gait deviations   TODAY'S TREATMENT  [facility] Adult PT Treatment:  DATE: 11/21/2021 Therapeutic Exercise: Nustep level 5 x 5 mins Standing marching 2# 2x10 BIL Standing hip abduction 2# 2x10 BIL Standing hip extension 2# 2x10 BIL Standing hamstring curl 2# 2x10 BIL Supine bridges 2x10 Supine clamshells GTB 3x10 SLR 2x10 BIL 2# LTR x10 BIL Sidelying hip abduction x10 BIL STS no UE support x10 Pball roll out forward/lateral x10 each  [facility] Adult PT Treatment:                                                DATE: 11/17/2021 Aquatic therapy at [facility] [address] - therapeutic pool temp 92 degrees Pt enters building ambulating independently.  Treatment took place in water 3.8 to 4 ft 8 in.feet deep depending upon activity.  Pt entered and exited the pool via stairs and handrails independently.  Pt pain level 2/10 at initiation of water walking.  Walking forward/backwards/side stepping x2 laps each Lunge stepping forwards x2 laps Side stepping lunge x2 laps Runners stretch on bottom step x30" BIL Hamstring stretch on bottom step x30" BIL Figure 4 squat stretch, BIL UE support 2x30" BIL Standing thoracic rotation with hands in neutral for resistance x10 BIL At edge of pool, pt performed LE exercise: Hip abd/add x20 BIL Hip ext/flex with  knee straight x 20 BIL Hip Circles CC/CCW x10 each BIL Marching hip flexion to knee extension 2x10 BIL Hamstring curl x20 BIL Squats 2x20 Sitting on bench in water: Bicycle kicks x1' Reverse bicycle kicks x1' Flutter kicks x1' Scissor kicks x1' Kickboard push/pull x1' Kickboard push downs x1'  Pt requires the buoyancy of water for active assisted exercises with buoyancy supported for strengthening and AROM exercises. Hydrostatic pressure also supports joints by unweighting joint load by at least 50 % in 3-4 feet depth water. 80% in chest to neck deep water. Water will provide assistance with movement using the current and laminar flow while the buoyancy reduces weight bearing. Pt requires the viscosity of the water for resistance with strengthening exercises.   [facility] Adult PT Treatment:                                                DATE: 11/14/2021 Therapeutic Exercise: Nustep level 6 x 3 mins Standing marching 2# 2x10 BIL Standing hip abduction 2# 2x10 BIL Standing hip extension 2# 2x10 BIL Supine bridges 2x10 Supine clamshells GTB 2x10 SLR 2x10 BIL LTR x10 BIL Sidelying hip abduction x10 BIL STS no UE support x10 Pball roll out forward/lateral x10 each    PATIENT EDUCATION:  Education details: eval findings, FOTO, HEP, POC Person educated: Patient Education method: Explanation, Demonstration, and Handouts Education comprehension: verbalized understanding and returned demonstration     HOME EXERCISE PROGRAM: Access Code: [email or web address] URL: [email or web address] Date: 10/04/2021 Prepared by: Edwinna Areola   Exercises - Supine Posterior Pelvic Tilt  - 1 x daily - 7 x weekly - 2 sets - 10 reps - Supine Active Straight Leg Raise  - 1 x daily - 7 x weekly - 3 sets - 10 reps - Clamshell with Resistance  - 1 x daily - 7 x weekly - 3 sets - 10 reps - Sit to Stand Without Arm Support  - 1 x daily - 7 x weekly -  3 sets - 10 reps Added 11/14/2021 - Supine Lower Trunk  Rotation  - 1 x daily - 7 x weekly - 1 sets - 10 reps - 10 hold - Seated Flexion Stretch with Swiss Ball  - 1 x daily - 7 x weekly - 1 sets - 10 reps - 10 sec hold   ASSESSMENT:   CLINICAL IMPRESSION: Patient presents to PT with continued reports of LBP and also states her R knee is bothering her today.Session today focused on core and proximal hip strengthening as well as stretching for the lower back. Patient was able to tolerate all prescribed exercises with no adverse effects. Patient continues to benefit from skilled PT services and should be progressed as able to improve functional independence.     OBJECTIVE IMPAIRMENTS decreased activity tolerance, decreased balance, difficulty walking, decreased ROM, decreased strength, and pain.    ACTIVITY LIMITATIONS cleaning, community activity, yard work, and recreational activity .    PERSONAL FACTORS Time since onset of injury/illness/exacerbation and 1-2 comorbidities: HTN, DM II  are also affecting patient's functional outcome.      REHAB POTENTIAL: Excellent   CLINICAL DECISION MAKING: Stable/uncomplicated   EVALUATION COMPLEXITY: Low     GOALS: Goals reviewed with patient? No   SHORT TERM GOALS: Target date: 10/25/2021     Pt will be compliant and knowledgeable with initial HEP for improved comfort and carryover Baseline: initial HEP given  Goal status: MET 10/25/2021   2.  Pt will self report lower back pain no greater than 6/10 for improved comfort and functional ability Baseline: 9/10 at worst (5/31 - 8/10 LBP) Goal status: Ongoing   LONG TERM GOALS: Target date: 11/29/2021     Pt will self report lower back pain no greater than 2/10 for improved comfort and functional ability Baseline: 9/10 at wors Goal status: INITIAL   2.  Pt will increase 30 Second Sit to Stand rep count to no less than 15 reps for improved balance, strength, and functional mobility Baseline: 13 reps  Goal status: INITIAL   3.  Pt will improve FOTO  function score to no less than 56% as proxy for functional improvement Baseline: 43% function Goal status: INITIAL   4.  Pt will be able to ambulate 1-2 miles without increase in lower back pain in order to improve function and get back to desired recreationa lactivity Baseline: unable Goal status: INITIAL   PLAN: PT FREQUENCY: 1-2x/week   PT DURATION: 8 weeks   PLANNED INTERVENTIONS: Therapeutic exercises, Therapeutic activity, Neuromuscular re-education, Balance training, Gait training, Patient/Family education, Joint mobilization, Aquatic Therapy, Dry Needling, Electrical stimulation, and Manual therapy.   PLAN FOR NEXT SESSION: update HEP PRN, progress core and proximal hip strength    Harland German, PTA 11/21/2021, 9:55 AM

## 2021-11-21 ENCOUNTER — Ambulatory Visit: Payer: Medicare Other

## 2021-11-21 DIAGNOSIS — M5459 Other low back pain: Secondary | ICD-10-CM

## 2021-11-21 DIAGNOSIS — M6281 Muscle weakness (generalized): Secondary | ICD-10-CM

## 2021-11-21 DIAGNOSIS — R2689 Other abnormalities of gait and mobility: Secondary | ICD-10-CM

## 2021-11-24 ENCOUNTER — Ambulatory Visit (HOSPITAL_BASED_OUTPATIENT_CLINIC_OR_DEPARTMENT_OTHER): Payer: Medicare Other | Attending: Internal Medicine | Admitting: Physical Therapy

## 2021-11-24 DIAGNOSIS — M5459 Other low back pain: Secondary | ICD-10-CM | POA: Diagnosis present

## 2021-11-24 DIAGNOSIS — M6281 Muscle weakness (generalized): Secondary | ICD-10-CM | POA: Insufficient documentation

## 2021-11-24 DIAGNOSIS — R2689 Other abnormalities of gait and mobility: Secondary | ICD-10-CM | POA: Diagnosis present

## 2021-11-24 NOTE — Therapy (Addendum)
OUTPATIENT PHYSICAL THERAPY TREATMENT NOTE/DISCHARGE  PHYSICAL THERAPY DISCHARGE SUMMARY  Visits from Start of Care: 8  Current functional level related to goals / functional outcomes: Unable to assess   Remaining deficits: Unable to assess   Education / Equipment: N/A   Patient agrees to discharge. Patient goals were  unable to assess . Patient is being discharged due to not returning since the last visit.   Patient Name: Joanna Gilbert MRN: [id number] DOB:1950/10/06, 71 y.o., female Today's Date: 11/24/2021  PCP: Velna Ochs, MD REFERRING PROVIDER: Velna Ochs, MD  END OF SESSION:   PT End of Session - 11/24/21 0801     Visit Number 8    Number of Visits 17    Date for PT Re-Evaluation 11/29/21    Authorization Type [employer]    PT Start Time 0800    PT Stop Time 0845    PT Time Calculation (min) 45 min                 Past Medical History:  Diagnosis Date   Anxiety    Depression 02/21/2011   Diverticulosis    Fibroid uterus 05/06/2006   Nov 2011: total abdominal hysterectomy with b/l salpingoopherectomy by Dr. Mora Bellman for symptomatic fibroids.  Qualifier: Diagnosis of  By: [name] MD, Yuri     History of colonic polyps    Hyperlipidemia    Hypertension    Hypothyroidism    Osteoarthritis 02/21/2011   MR b/l Hips (2009): Mild to moderate bilateral hip osteoarthritis, more notable on the right, with likely associated small area of anterior acetabular labral tearing.  MR R knee (2009): Dominant finding is marked interval progression in lateral compartment degenerative disease with new tearing in the posterior horn of the lateral meniscus. Persistent perimeniscal cyst formation related to lateral meniscal tearing as describe above MR Lumbar spine (2009) : 1. Dominant finding is advanced facet degenerative change L4-5 with small facet joint effusions and bony edema about the joints. This could be a source of back pain.  2. Negative for  notable central canal and foraminal stenosis in patient with a somewhat congenitally narrowed central canal.     Plantar fasciitis, bilateral    Prediabetes    Seasonal allergies    Past Surgical History:  Procedure Laterality Date   GASTRIC RESTRICTION SURGERY  1980   KNEE SURGERY Right 2007, 2009   Had cyst removed   [place] W/ BILATERAL SALPINGOOPHORECTOMY  03/2010   because of symptomatic fibroids, Dr. Mora Bellman   Patient Active Problem List   Diagnosis Date Noted   Elevated alkaline phosphatase measurement 09/20/2021   Hypercalcemia 09/08/2021   Obesity 09/08/2021   Prediabetes 49/82/6415   Systolic murmur 83/01/4075   Hypertension 01/02/2018   Bilateral rotator cuff dysfunction 07/19/2016   Chronic back pain 08/03/2015   Malaise 08/03/2015   Paresthesias 08/03/2015   Healthcare maintenance 08/03/2015   History of colonic polyps 08/03/2015   Hyperlipidemia    Depression with anxiety 02/21/2011   Osteoarthritis 02/21/2011   Hypothyroidism 05/06/2006    REFERRING DIAG: M54.42,M54.41,G89.29 (ICD-10-CM) - Chronic bilateral low back pain with bilateral sciatica  THERAPY DIAG:  Other low back pain  Muscle weakness (generalized)  Other abnormalities of gait and mobility  Rationale for Evaluation and Treatment Rehabilitation  PERTINENT HISTORY: HTN, DM II  PRECAUTIONS: None  SUBJECTIVE: Pt reports that she is walking about 12 blocks with her rollator 5-6 days per week in the  early morning.  She reports she is doing 50-100 reps of her HEP exercises, "Maybe I'm doing too much".   Her pain level at its greatest continues to be 9-10/10.   She is interested in continuing therapy as she feels as though it is helping some.    PAIN:  Are you having pain? Yes Yes: NPRS scale: 7/10 Pain location: lower back; posterior hip Pain description: dull ache Aggravating factors: prolonged standing, walking Relieving  factors: medication   OBJECTIVE: (objective measures completed at initial evaluation unless otherwise dated)   DIAGNOSTIC FINDINGS:   CLINICAL DATA:  Chronic low back pain radiating to the buttocks and down the legs with associated weakness and numbness.   EXAM: MRI LUMBAR SPINE WITHOUT CONTRAST   TECHNIQUE: Multiplanar, multisequence MR imaging of the lumbar spine was performed. No intravenous contrast was administered.   COMPARISON:  MR lumbar 06/12/2007.   FINDINGS: Segmentation:  Standard.   Alignment: New facet mediated anterolisthesis of L4 on L5 measuring 2 mm.   Vertebrae: No fracture, suspicious marrow lesion, or significant marrow edema. Hemangioma in the T11 vertebral body.   Conus medullaris and cauda equina: Conus extends to the L1-2 level. Conus and cauda equina appear normal.   Paraspinal and other soft tissues: Unremarkable.   Disc levels:   T12-L1: Negative.   L1-2: Normal disc. Mild facet and ligamentum flavum hypertrophy without stenosis.   L2-3: New disc desiccation. Moderate facet and ligamentum flavum hypertrophy without disc herniation or significant stenosis.   L3-4: Disc desiccation. Disc bulging, congenitally short pedicles, and moderate facet and ligamentum flavum hypertrophy result in new mild spinal stenosis without significant neural foraminal stenosis.   L4-5: Disc desiccation. Anterolisthesis with disc uncovering and moderate to severe right greater than left facet hypertrophy without significant stenosis.   L5-S1: A left central to left foraminal disc protrusion and moderate facet hypertrophy result in new mild left lateral recess and mild left neural foraminal stenosis without spinal stenosis.   IMPRESSION: 1. Mildly progressive lumbar spondylosis and advanced facet arthrosis. 2. New mild spinal stenosis at L3-4. 3. New mild left lateral recess and left neural foraminal stenosis at L5-S1. 4. Advanced facet arthrosis at  L4-5 with new grade 1 anterolisthesis but no stenosis.      PATIENT SURVEYS:  FOTO 43% function; 56% predicted  11/24/21:  38% function    COGNITION:           Overall cognitive status: Within functional limits for tasks assessed                          SENSATION: WFL   MUSCLE LENGTH: Hamstrings: Right DNT deg; Left DNT deg   POSTURE:  Large body habitus, increased lumbar lordosis, slumped sitting posture   PALPATION: TTP to bilateral piriformis and L lumbar paraspinals   LE MMT:   MMT Right 10/04/2021 Left 10/04/2021  Hip flexion  5/5 5/5  Hip extension      Hip abduction 4/5 4/5  Hip adduction 5/5 5/5  Hip external rotation      Hip internal rotation      Knee extension 5/5 5/5  Knee flexion 5/5 5/5  Ankle dorsiflexion       Ankle plantarflexion      Ankle inversion      Ankle eversion      Grossly      (Blank rows = not tested)    LUMBAR SPECIAL TESTS:  SLR: Left Positive   FUNCTIONAL TESTS:  30 Second Sit to Stand: 13 reps  11/24/21:  30s STS:  13 reps (no arms used)   GAIT: Distance walked: 63f Assistive device utilized: None Level of assistance: Complete Independence Comments: no overt gait deviations   TODAY'S TREATMENT  [facility] Adult PT Treatment:                                                DATE: 11/17/2021 FOTO and 30 sec STS completed Pt shown and completed seated piriformis stretch, supine piriformis stretch and modified table piriformis stretch  [address]quatic therapy at [address] - therapeutic pool temp 92 degrees Pt enters building ambulating independently.  Treatment took place in water 3.8 to 4 ft 8 in.feet deep depending upon activity.  Pt entered and exited the pool via stairs and handrails independently.   Walking forward/backwards/side stepping x 4 laps each; pt instructed to slow the speed of walking and bring trunk more upright Sitting on 3rd step in water: Bicycle kicks x1' Scissor kicks x1' 2 sets STS  x 8, with use of rails coming up(very challenging , eccentric lowering back to step without UE support Quad stretch with foot on 2nd step  In deep water, sitting on thick noodle holding corner of pool:  bicycle and hip abdct/ add (pt reported good relief of back pain here)   Pt requires the buoyancy of water for active assisted exercises with buoyancy supported for strengthening and AROM exercises. Hydrostatic pressure also supports joints by unweighting joint load by at least 50 % in 3-4 feet depth water. 80% in chest to neck deep water. Water will provide assistance with movement using the current and laminar flow while the buoyancy reduces weight bearing. Pt requires the viscosity of the water for resistance with strengthening exercises.  [facility]                                                [facility]: 11/21/2021 Therapeutic Exercise: Nustep level 5 x 5 mins Standing marching 2# 2x10 BIL Standing hip abduction 2# 2x10 BIL Standing hip extension 2# 2x10 BIL Standing hamstring curl 2# 2x10 BIL Supine bridges 2x10 Supine clamshells GTB 3x10 SLR 2x10 BIL 2# LTR x10 BIL Sidelying hip abduction x10 BIL STS no UE support x10 Pball roll out forward/lateral x10 each  [facility] Adult PT Treatment:                                                DATE: 11/17/2021 [address] - therapeutic pool temp 92 degrees Pt enters building ambulating independently.  Treatment took place in water 3.8 to 4 ft 8 in.feet deep depending upon activity.  Pt entered and exited the pool via stairs and handrails independently.  Pt pain level 2/10 at initiation of water walking.  Walking forward/backwards/side stepping x2 laps each Lunge stepping forwards x2 laps Side stepping lunge x2 laps Runners stretch on bottom step x30" BIL Hamstring stretch on bottom step x30" BIL Figure 4 squat stretch, BIL UE support  2x30" BIL Standing thoracic rotation with hands in neutral for  resistance x10 BIL At edge of pool, pt performed LE exercise: Hip abd/add x20 BIL Hip ext/flex with knee straight x 20 BIL Hip Circles CC/CCW x10 each BIL Marching hip flexion to knee extension 2x10 BIL Hamstring curl x20 BIL Squats 2x20 Sitting on bench in water: Bicycle kicks x1' Reverse bicycle kicks x1' Flutter kicks x1' Scissor kicks x1' Kickboard push/pull x1' Kickboard push downs x1'  Pt requires the buoyancy of water for active assisted exercises with buoyancy supported for strengthening and AROM exercises. Hydrostatic pressure also supports joints by unweighting joint load by at least 50 % in 3-4 feet depth water. 80% in chest to neck deep water. Water will provide assistance with movement using the current and laminar flow while the buoyancy reduces weight bearing. Pt requires the viscosity of the water for resistance with strengthening exercises.   [facility] Adult PT Treatment:                                                DATE: 11/14/2021 Therapeutic Exercise: Nustep level 6 x 3 mins Standing marching 2# 2x10 BIL Standing hip abduction 2# 2x10 BIL Standing hip extension 2# 2x10 BIL Supine bridges 2x10 Supine clamshells GTB 2x10 SLR 2x10 BIL LTR x10 BIL Sidelying hip abduction x10 BIL STS no UE support x10 Pball roll out forward/lateral x10 each    PATIENT EDUCATION:  Education details: eval findings, FOTO, HEP, POC Person educated: Patient Education method: Explanation, Demonstration, and Handouts Education comprehension: verbalized understanding and returned demonstration     HOME EXERCISE PROGRAM: Access Code: [email or web address] URL: [email or web address] Date: 10/04/2021 Prepared by: Octavio Manns   Exercises - Supine Posterior Pelvic Tilt  - 1 x daily - 7 x weekly - 2 sets - 10 reps - Supine Active Straight Leg Raise  - 1 x daily - 7 x weekly - 3 sets - 10 reps - Clamshell with Resistance  - 1 x daily - 7 x weekly - 3 sets - 10 reps - Sit to Stand  Without Arm Support  - 1 x daily - 7 x weekly - 3 sets - 10 reps Added 11/14/2021 - Supine Lower Trunk Rotation  - 1 x daily - 7 x weekly - 1 sets - 10 reps - 10 hold - Seated Flexion Stretch with Swiss Ball  - 1 x daily - 7 x weekly - 1 sets - 10 reps - 10 sec hold Added 11/24/2021 - Supine Piriformis Stretch with Foot on Ground  - 1 x daily - 7 x weekly - 1 sets - 2 reps - 10-30 seconds hold - Quadricep Stretch with Chair and Counter Support  - 1 x daily - 7 x weekly - 1 sets - 2 reps - 10-30 seconds hold   ASSESSMENT:   CLINICAL IMPRESSION: Pt scored lower on her FOTO assessment; likely due to increased awareness of deficits compared to eval/intake.  Her 30 sec STS test remained unchanged.  She had difficulty performing seated fig 4 stretch on land, but tolerated supine version with towel assist well.  She required minor cues to bring trunk to more upright position while exercising in water.  Encouraged pt to complete HEP as it was given and not over do it; "less is more".  Patient continues to benefit from skilled PT services and should be  progressed as able to improve functional independence.     OBJECTIVE IMPAIRMENTS decreased activity tolerance, decreased balance, difficulty walking, decreased ROM, decreased strength, and pain.    ACTIVITY LIMITATIONS cleaning, community activity, yard work, and recreational activity .    PERSONAL FACTORS Time since onset of injury/illness/exacerbation and 1-2 comorbidities: HTN, DM II  are also affecting patient's functional outcome.      REHAB POTENTIAL: Excellent   CLINICAL DECISION MAKING: Stable/uncomplicated   EVALUATION COMPLEXITY: Low     GOALS: Goals reviewed with patient? No   SHORT TERM GOALS: Target date: 10/25/2021     Pt will be compliant and knowledgeable with initial HEP for improved comfort and carryover Baseline: initial HEP given  Goal status: MET 10/25/2021   2.  Pt will self report lower back pain no greater than 6/10 for  improved comfort and functional ability Baseline: 9/10 at worst (5/31 - 8/10 LBP) Goal status: Ongoing   LONG TERM GOALS: Target date: 11/29/2021     Pt will self report lower back pain no greater than 2/10 for improved comfort and functional ability Baseline: 9/10 at worst Goal status: INITIAL   2.  Pt will increase 30 Second Sit to Stand rep count to no less than 15 reps for improved balance, strength, and functional mobility Baseline: 13 reps  Goal status: INITIAL   3.  Pt will improve FOTO function score to no less than 56% as proxy for functional improvement Baseline: 43% function Goal status: INITIAL   4.  Pt will be able to ambulate 1-2 miles without increase in lower back pain in order to improve function and get back to desired recreationa lactivity Baseline: unable Goal status: INITIAL   PLAN: PT FREQUENCY: 1-2x/week   PT DURATION: 8 weeks   PLANNED INTERVENTIONS: Therapeutic exercises, Therapeutic activity, Neuromuscular re-education, Balance training, Gait training, Patient/Family education, Joint mobilization, Aquatic Therapy, Dry Needling, Electrical stimulation, and Manual therapy.   PLAN FOR NEXT SESSION: assess HEP and modify as needed.  9th visit progress note?/ recert by PT  Kerin Perna, PTA 11/24/21 10:53 AM

## 2021-11-25 IMAGING — MR MR LUMBAR SPINE W/O CM
4 of 5 series · 26 of 48 positions shown · non-contrast
Comparison: MR lumbar 06/12/2007.

CLINICAL DATA: Chronic low back pain radiating to the buttocks and
down the legs with associated weakness and numbness.

EXAM:
MRI LUMBAR SPINE WITHOUT CONTRAST
TECHNIQUE: Multiplanar, multisequence MR imaging of the lumbar spine was
performed. No intravenous contrast was administered.

[Series 3: T2 · sagittal · 4.0mm · 0.53mm/px · 6 of 16 slices shown (1 of 2)]
[im 1/16]
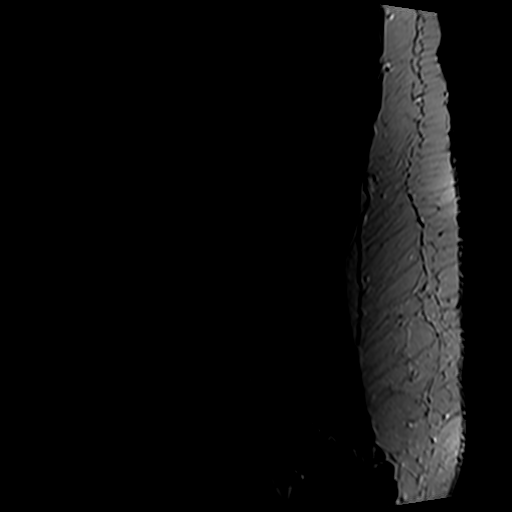
[im 4/16]
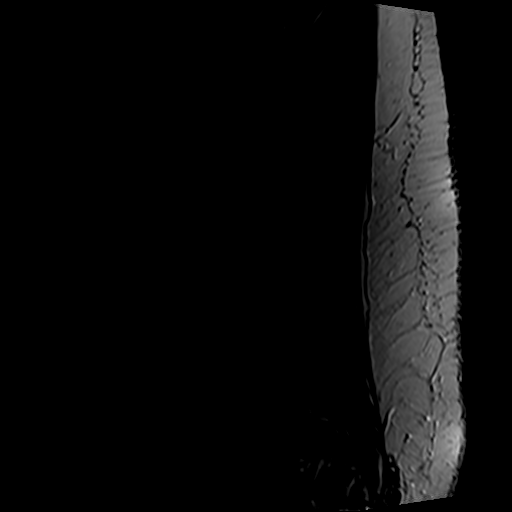
[im 7/16]
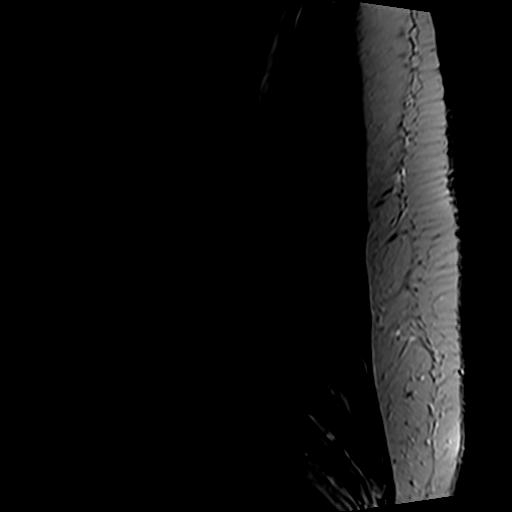
[im 10/16]
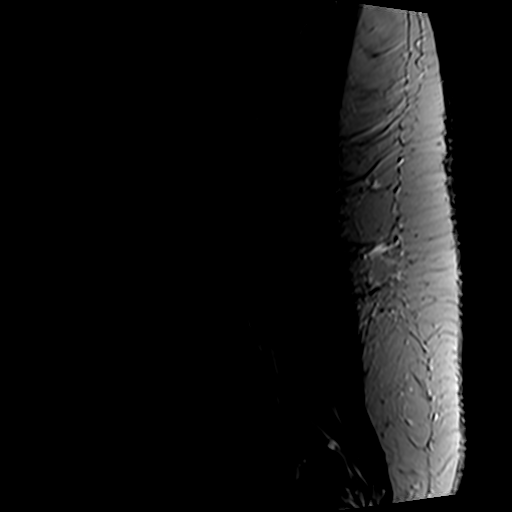
[im 13/16]
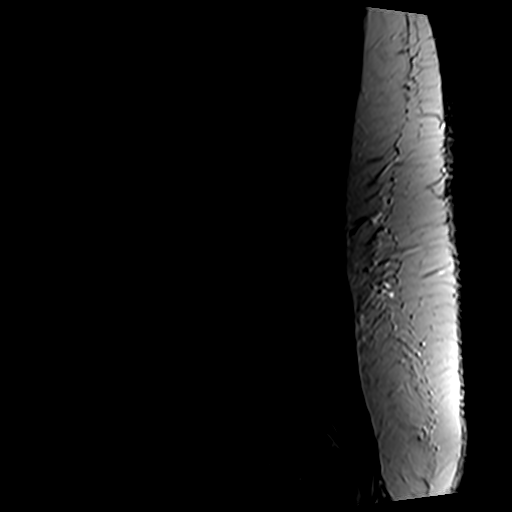
[im 16/16]
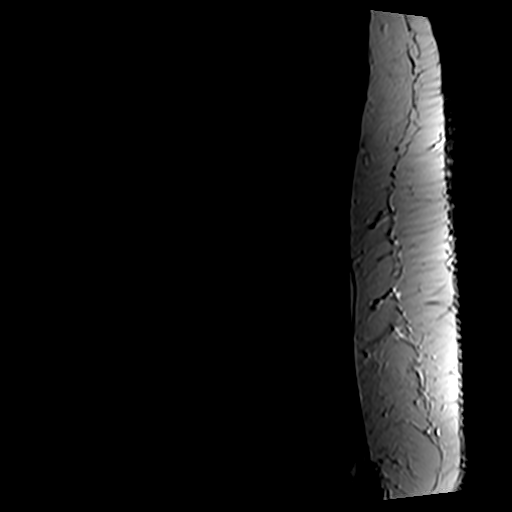

[Series 5: T1 · sagittal · 4.0mm · 0.53mm/px · 6 of 16 slices shown (1 of 2)]
[im 1/16]
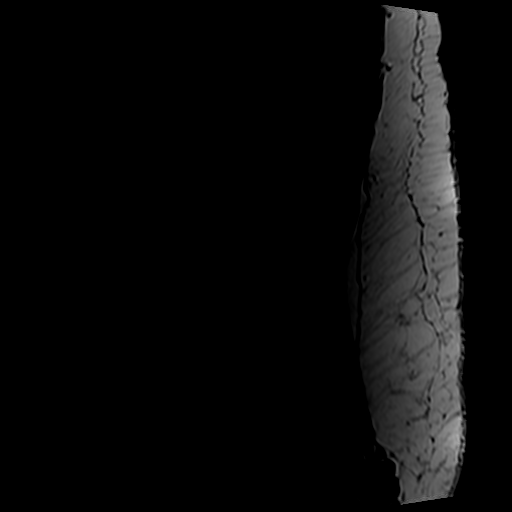
[im 4/16]
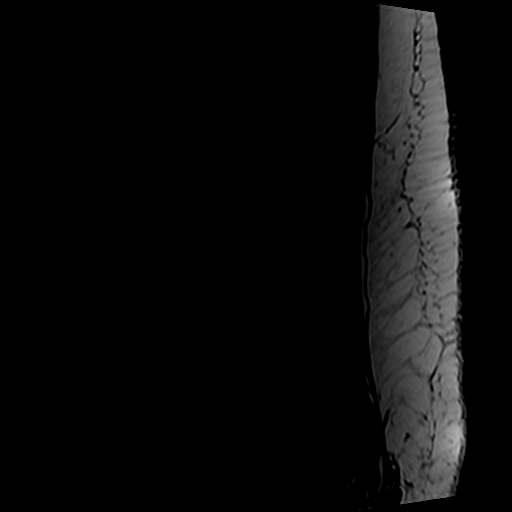
[im 7/16]
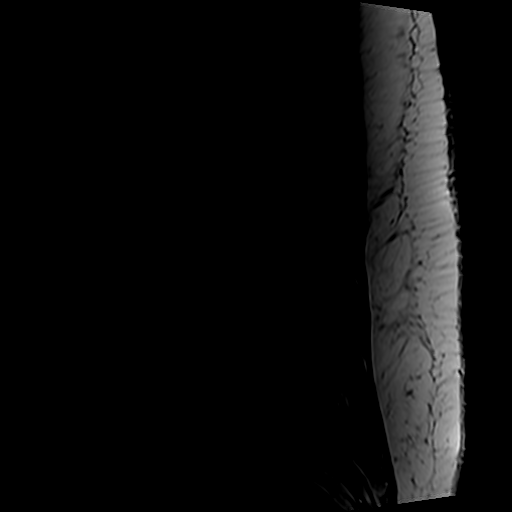
[im 10/16]
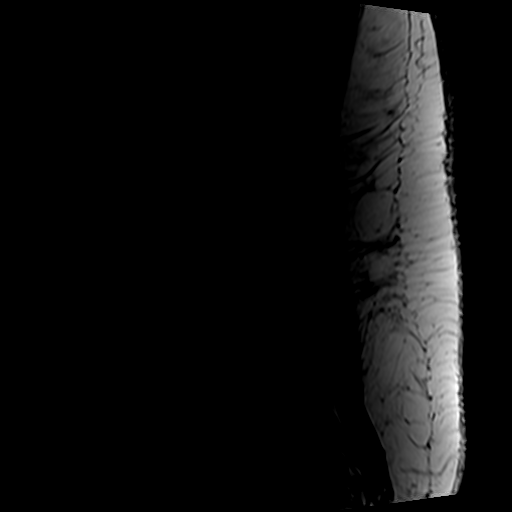
[im 13/16]
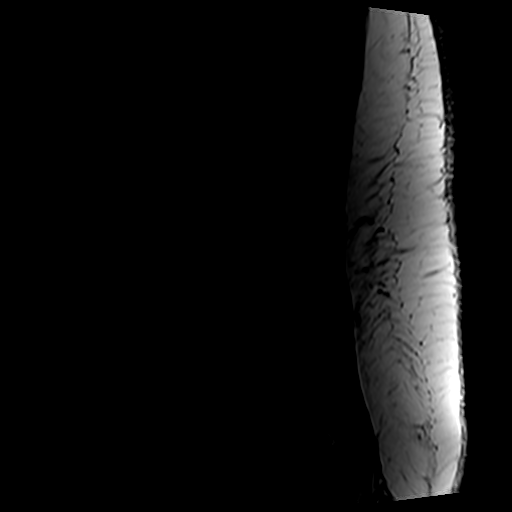
[im 16/16]
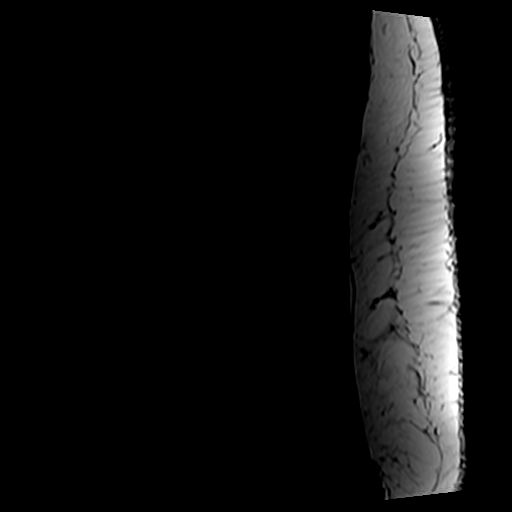

[Series 6: T2 · axial · 4.0mm · 0.70mm/px · z∈[+19,+236]mm · 9 of 40 slices shown (2 of 2)]
[im 1/40]
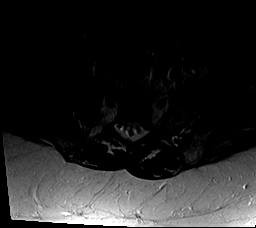
[im 6/40]
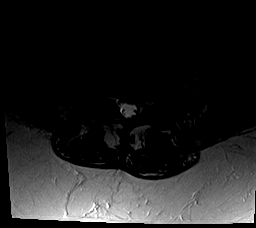
[im 12/40]
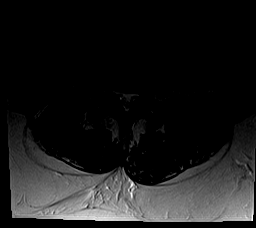
[im 17/40]
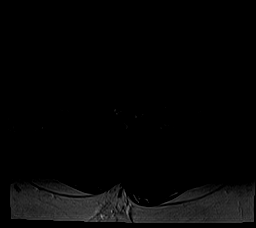
[im 20/40]
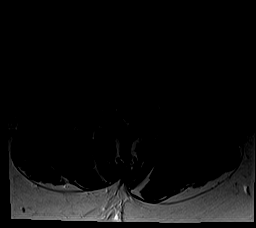
[im 23/40]
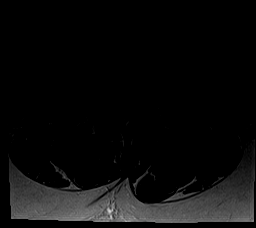
[im 28/40]
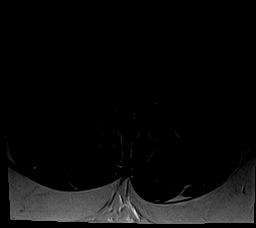
[im 34/40]
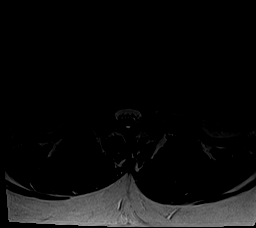
[im 40/40]
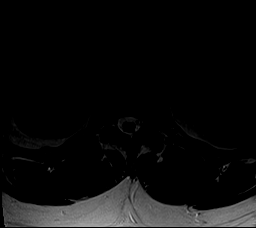

[Series 7: T1 · axial · 4.0mm · 0.35mm/px · z∈[+19,+204]mm · 5 of 40 slices shown (2 of 2)]
[im 1/40]
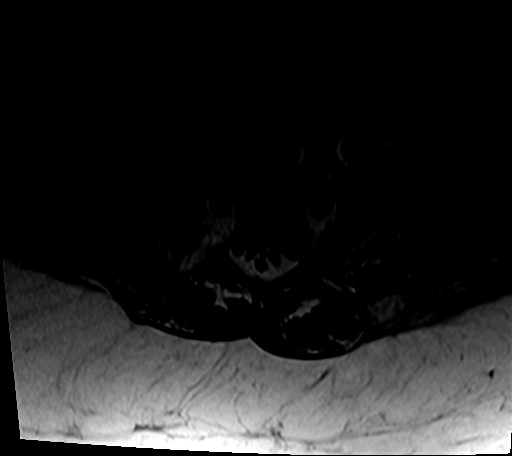
[im 6/40]
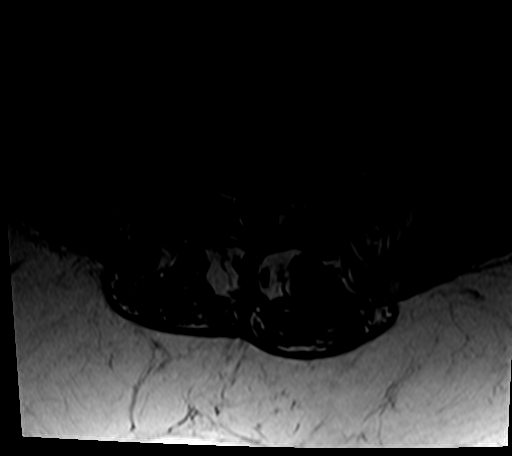
[im 12/40]
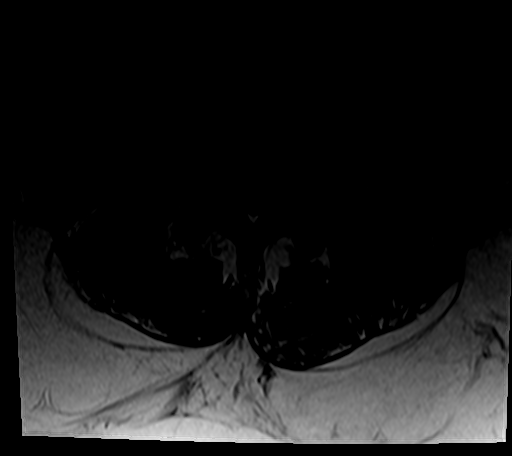
[im 20/40]
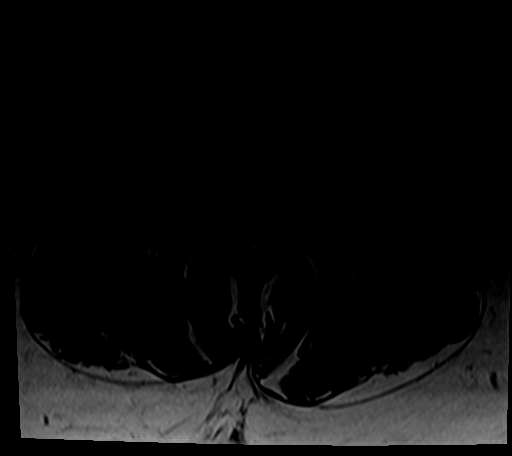
[im 34/40]
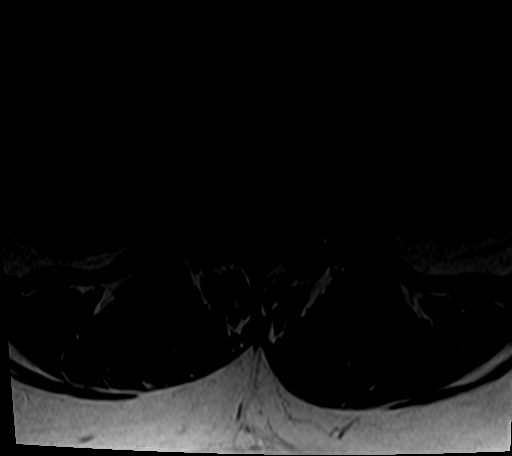

[26 of 48 positions shown; findings below may reference images not displayed]

FINDINGS: Segmentation:  Standard.

Alignment: New facet mediated anterolisthesis of L4 on L5 measuring
2 mm.

Vertebrae: No fracture, suspicious marrow lesion, or significant
marrow edema. Hemangioma in the T11 vertebral body.

Conus medullaris and cauda equina: Conus extends to the L1-2 level.
Conus and cauda equina appear normal.

Paraspinal and other soft tissues: Unremarkable.

Disc levels:

T12-L1: Negative.

L1-2: Normal disc. Mild facet and ligamentum flavum hypertrophy
without stenosis.

L2-3: New disc desiccation. Moderate facet and ligamentum flavum
hypertrophy without disc herniation or significant stenosis.

L3-4: Disc desiccation. Disc bulging, congenitally short pedicles,
and moderate facet and ligamentum flavum hypertrophy result in new
mild spinal stenosis without significant neural foraminal stenosis.

L4-5: Disc desiccation. Anterolisthesis with disc uncovering and
moderate to severe right greater than left facet hypertrophy without
significant stenosis.

L5-S1: A left central to left foraminal disc protrusion and moderate
facet hypertrophy result in new mild left lateral recess and mild
left neural foraminal stenosis without spinal stenosis.
IMPRESSION: 1. Mildly progressive lumbar spondylosis and advanced facet
arthrosis.
2. New mild spinal stenosis at L3-4.
3. New mild left lateral recess and left neural foraminal stenosis
at L5-S1.
4. Advanced facet arthrosis at L4-5 with new grade 1 anterolisthesis
but no stenosis.

## 2021-12-01 ENCOUNTER — Other Ambulatory Visit: Payer: Self-pay | Admitting: Internal Medicine

## 2021-12-01 DIAGNOSIS — E038 Other specified hypothyroidism: Secondary | ICD-10-CM

## 2022-01-10 ENCOUNTER — Encounter: Payer: Medicare Other | Admitting: Internal Medicine

## 2022-01-27 ENCOUNTER — Other Ambulatory Visit: Payer: Self-pay | Admitting: Internal Medicine

## 2022-01-27 DIAGNOSIS — I10 Essential (primary) hypertension: Secondary | ICD-10-CM

## 2022-01-27 DIAGNOSIS — E038 Other specified hypothyroidism: Secondary | ICD-10-CM

## 2022-01-31 ENCOUNTER — Other Ambulatory Visit: Payer: Self-pay | Admitting: Internal Medicine

## 2022-01-31 DIAGNOSIS — E038 Other specified hypothyroidism: Secondary | ICD-10-CM

## 2022-02-01 NOTE — Telephone Encounter (Signed)
Please have patient schedule follow up; sent short term synthroid refill. Thanks.

## 2022-03-21 ENCOUNTER — Encounter: Payer: Medicare Other | Admitting: Internal Medicine

## 2022-03-22 ENCOUNTER — Encounter: Payer: Self-pay | Admitting: Internal Medicine

## 2022-04-17 ENCOUNTER — Telehealth: Payer: Self-pay | Admitting: *Deleted

## 2022-04-17 NOTE — Telephone Encounter (Signed)
Contacted regarding [facility] referral. Left voice message to return my call.

## 2022-04-27 ENCOUNTER — Other Ambulatory Visit: Payer: Self-pay | Admitting: Student in an Organized Health Care Education/Training Program

## 2022-04-27 ENCOUNTER — Other Ambulatory Visit: Payer: Self-pay | Admitting: Internal Medicine

## 2022-04-27 DIAGNOSIS — I10 Essential (primary) hypertension: Secondary | ICD-10-CM

## 2022-04-27 DIAGNOSIS — E038 Other specified hypothyroidism: Secondary | ICD-10-CM

## 2022-04-30 NOTE — Telephone Encounter (Signed)
Refill declined, patient needs appointment. No showed her last OV. Once she schedules, I can send a short term Rx to bridge her. Thanks.

## 2022-04-30 NOTE — Telephone Encounter (Signed)
Refill request denied. Patient needs to schedule follow up before I can refill, she is overdue and no showed her last appointment. Thanks.

## 2022-05-01 ENCOUNTER — Encounter: Payer: Self-pay | Admitting: Internal Medicine

## 2022-05-16 ENCOUNTER — Encounter: Payer: Self-pay | Admitting: Internal Medicine

## 2022-05-16 ENCOUNTER — Ambulatory Visit (INDEPENDENT_AMBULATORY_CARE_PROVIDER_SITE_OTHER): Payer: Medicare Other | Admitting: Internal Medicine

## 2022-05-16 VITALS — BP 155/66 | HR 87 | Temp 98.0°F | Ht 62.0 in | Wt 243.3 lb

## 2022-05-16 DIAGNOSIS — R7303 Prediabetes: Secondary | ICD-10-CM

## 2022-05-16 DIAGNOSIS — M545 Low back pain, unspecified: Secondary | ICD-10-CM

## 2022-05-16 DIAGNOSIS — E038 Other specified hypothyroidism: Secondary | ICD-10-CM | POA: Diagnosis not present

## 2022-05-16 DIAGNOSIS — R748 Abnormal levels of other serum enzymes: Secondary | ICD-10-CM | POA: Diagnosis not present

## 2022-05-16 DIAGNOSIS — E785 Hyperlipidemia, unspecified: Secondary | ICD-10-CM

## 2022-05-16 DIAGNOSIS — G8929 Other chronic pain: Secondary | ICD-10-CM

## 2022-05-16 DIAGNOSIS — E119 Type 2 diabetes mellitus without complications: Secondary | ICD-10-CM

## 2022-05-16 DIAGNOSIS — I10 Essential (primary) hypertension: Secondary | ICD-10-CM

## 2022-05-16 DIAGNOSIS — Z0000 Encounter for general adult medical examination without abnormal findings: Secondary | ICD-10-CM

## 2022-05-16 LAB — POCT GLYCOSYLATED HEMOGLOBIN (HGB A1C): Hemoglobin A1C: 7.2 % — AB (ref 4.0–5.6)

## 2022-05-16 LAB — GLUCOSE, CAPILLARY: Glucose-Capillary: 128 mg/dL — ABNORMAL HIGH (ref 70–99)

## 2022-05-16 MED ORDER — AMLODIPINE-OLMESARTAN 10-40 MG PO TABS: 1.0000 | ORAL_TABLET | Freq: Every day | ORAL | 3 refills | Status: DC

## 2022-05-16 MED ORDER — LEVOTHYROXINE SODIUM 100 MCG PO TABS: 100.0000 ug | ORAL_TABLET | Freq: Every day | ORAL | 3 refills | Status: DC

## 2022-05-16 NOTE — Assessment & Plan Note (Signed)
-   Plan for CMP in spring 2024

## 2022-05-16 NOTE — Assessment & Plan Note (Signed)
-   Patient did not feel well on lipitor, and would not like to start another lipid medicine right now. She is at high risk for CVD. We discussed diet & lifestyle modifications for now, but I think a statin would be better in the future if she's open to trying a different one

## 2022-05-16 NOTE — Assessment & Plan Note (Signed)
-   She didn't tolerate Cymbalta, so I removed this from her medicine list - She doesn't currently have time to do aquatic therapy, but this was helpful in the past, so she'd like to go back if her schedule allows in the future. She'll let us know if she ever needs a new referral

## 2022-05-16 NOTE — Assessment & Plan Note (Signed)
-   I recommended MMG, colonoscopy, and vaccinations today. We did some shared decision making, and patient would not like to continue with cancer screening right now. She also politely declined the vaccines today.

## 2022-05-16 NOTE — Assessment & Plan Note (Signed)
-   TSH normal in 07/3021 - Continue Synthroid 153mg daily, script sent to pharmacy - Repeat TSH annually, will likely check labs at 346-monthollow-up visit this spring

## 2022-05-16 NOTE — Progress Notes (Signed)
[facility]: Clinic Note  Subjective:  History of Present Illness: Joanna Gilbert is a 71 y.o. year old female who presents for routine follow-up. Last visit was 09/19/21 with Dr. Court Joy.    Please refer to Assessment and Plan below for full details in Problem-Based Charting.   Past Medical History:  Patient Active Problem List   Diagnosis Date Noted   Elevated alkaline phosphatase measurement 09/20/2021   Hypercalcemia 09/08/2021   Obesity 09/08/2021   Type 2 diabetes mellitus ([place]) 00/17/4944   Systolic murmur 96/75/9163   Hypertension 01/02/2018   Bilateral rotator cuff dysfunction 07/19/2016   Chronic back pain 08/03/2015   Malaise 08/03/2015   Paresthesias 08/03/2015   Healthcare maintenance 08/03/2015   History of colonic polyps 08/03/2015   Hyperlipidemia    Depression with anxiety 02/21/2011   Osteoarthritis 02/21/2011   Hypothyroidism 05/06/2006      Medications:  Current Outpatient Medications:    amLODipine-olmesartan (AZOR) 10-40 MG tablet, Take 1 tablet by mouth daily., Disp: 90 tablet, Rfl: 3   levothyroxine (SYNTHROID) 100 MCG tablet, Take 1 tablet (100 mcg total) by mouth daily before breakfast., Disp: 90 tablet, Rfl: 3   Allergies: Allergies  Allergen Reactions   Asa [Aspirin]     Pt states ASA triggers her "allergies"   Lisinopril     REACTION: cough    Objective:   Vitals: Vitals:   05/16/22 0938  BP: (!) 155/66  Pulse: 87  Temp: 98 F (36.7 C)  SpO2: 100%    Physical Exam: Physical Exam Constitutional:      Appearance: Normal appearance.  HENT:     Head: Normocephalic and atraumatic.  Pulmonary:     Effort: Pulmonary effort is normal. No respiratory distress.  Neurological:     Mental Status: She is alert.  Psychiatric:        Mood and Affect: Mood normal.        Behavior: Behavior normal.      Data: Labs, imaging, and micro were reviewed in Epic. Refer to Assessment and Plan below for full details in  Problem-Based Charting.  Assessment & Plan:  Hypothyroidism - TSH normal in 07/3021 - Continue Synthroid 120mg daily, script sent to pharmacy - Repeat TSH annually, will likely check labs at 373-monthollow-up visit this spring  Hypertension - Blood pressure mildly elevated at 155/66 today, but this is in the setting of being out of Azor x1 week - Refill Azor  Chronic back pain - She didn't tolerate Cymbalta, so I removed this from her medicine list - She doesn't currently have time to do aquatic therapy, but this was helpful in the past, so she'd like to go back if her schedule allows in the future. She'll let usKoreanow if she ever needs a new referral  Elevated alkaline phosphatase measurement - Plan for CMP in spring 2024  Type 2 diabetes mellitus (HCC) - A1C 7.2 today, above goal of 7.0 - We discussed dietary changes today, we both agreed she will try these first before adding a medicine - Repeat A1C at 3-77-monthllow-up. If still above goal, discuss starting medicine  Healthcare maintenance - I recommended MMG, colonoscopy, and vaccinations today. We did some shared decision making, and patient would not like to continue with cancer screening right now. She also politely declined the vaccines today.  Hyperlipidemia - Patient did not feel well on lipitor, and would not like to start another lipid medicine right now. She is at high risk for  CVD. We discussed diet & lifestyle modifications for now, but I think a statin would be better in the future if she's open to trying a different one      Patient will follow up in 3 months. A1C, CMP, TSH then.  Lottie Mussel, MD

## 2022-05-16 NOTE — Assessment & Plan Note (Signed)
-   A1C 7.2 today, above goal of 7.0 - We discussed dietary changes today, we both agreed she will try these first before adding a medicine - Repeat A1C at 36-monthfollow-up. If still above goal, discuss starting medicine

## 2022-05-16 NOTE — Assessment & Plan Note (Signed)
-   Blood pressure mildly elevated at 155/66 today, but this is in the setting of being out of Azor x1 week - Refill Azor

## 2022-08-22 ENCOUNTER — Other Ambulatory Visit: Payer: Self-pay

## 2022-08-22 ENCOUNTER — Ambulatory Visit (INDEPENDENT_AMBULATORY_CARE_PROVIDER_SITE_OTHER): Payer: 59 | Admitting: Internal Medicine

## 2022-08-22 ENCOUNTER — Ambulatory Visit (INDEPENDENT_AMBULATORY_CARE_PROVIDER_SITE_OTHER): Payer: 59

## 2022-08-22 ENCOUNTER — Encounter: Payer: Self-pay | Admitting: Internal Medicine

## 2022-08-22 VITALS — BP 134/49 | HR 78 | Temp 97.9°F | Ht 62.0 in | Wt 235.6 lb

## 2022-08-22 DIAGNOSIS — Z0000 Encounter for general adult medical examination without abnormal findings: Secondary | ICD-10-CM

## 2022-08-22 DIAGNOSIS — E038 Other specified hypothyroidism: Secondary | ICD-10-CM

## 2022-08-22 DIAGNOSIS — E119 Type 2 diabetes mellitus without complications: Secondary | ICD-10-CM

## 2022-08-22 DIAGNOSIS — I10 Essential (primary) hypertension: Secondary | ICD-10-CM

## 2022-08-22 DIAGNOSIS — R011 Cardiac murmur, unspecified: Secondary | ICD-10-CM

## 2022-08-22 DIAGNOSIS — Z780 Asymptomatic menopausal state: Secondary | ICD-10-CM

## 2022-08-22 LAB — GLUCOSE, CAPILLARY: Glucose-Capillary: 120 mg/dL — ABNORMAL HIGH (ref 70–99)

## 2022-08-22 LAB — POCT GLYCOSYLATED HEMOGLOBIN (HGB A1C): Hemoglobin A1C: 6.5 % — AB (ref 4.0–5.6)

## 2022-08-22 NOTE — Assessment & Plan Note (Addendum)
Glycemic control much improved after initiation of dietary changes.  She has cut back on what she calls "white foods", foods high in carbohydrates and sugars.  Hemoglobin A1c today has improved to 6.5 from 7.2 three months ago.  Plan to continue with dietary changes, repeat hemoglobin A1c in 6 months.  Checking urine microalbumin, foot exam completed today.

## 2022-08-22 NOTE — Assessment & Plan Note (Signed)
Declining all vaccines as well as future mammograms.  Did not discuss colon cancer screening, will offer at next visit.

## 2022-08-22 NOTE — Assessment & Plan Note (Signed)
Patient has a chronic systolic murmur that she says has been present since childhood.  She was previously evaluated by pediatric cardiology but is unsure the etiology of her murmur.  We do not have an echocardiogram in our system.  She is agreeable to obtaining this today.  No signs or symptoms of heart failure or volume overload.

## 2022-08-22 NOTE — Assessment & Plan Note (Signed)
Blood pressure improved and at near goal on amlodipine-olmesartan 10-40 mg daily.  Rechecking renal function today.

## 2022-08-22 NOTE — Progress Notes (Signed)
Subjective:   Patient ID: Joanna Gilbert female   DOB: 1950/09/04 72 y.o.   MRN: [id number]  HPI: [name] is a 72 y.o. female with past medical history outlined below here for follow up of diabetes and HTN. For further details of today's visit, please refer to the assessment and plan below.   Past Medical History:  Diagnosis Date   Anxiety    Depression 02/21/2011   Diverticulosis    Fibroid uterus 05/06/2006   Nov 2011: total abdominal hysterectomy with b/l salpingoopherectomy by Dr. Mora Bellman for symptomatic fibroids.  Qualifier: Diagnosis of  By: [name] MD, Yuri     History of colonic polyps    Hyperlipidemia    Hypertension    Hypothyroidism    Osteoarthritis 02/21/2011   MR b/l Hips (2009): Mild to moderate bilateral hip osteoarthritis, more notable on the right, with likely associated small area of anterior acetabular labral tearing.  MR R knee (2009): Dominant finding is marked interval progression in lateral compartment degenerative disease with new tearing in the posterior horn of the lateral meniscus. Persistent perimeniscal cyst formation related to lateral meniscal tearing as describe above MR Lumbar spine (2009) : 1. Dominant finding is advanced facet degenerative change L4-5 with small facet joint effusions and bony edema about the joints. This could be a source of back pain.  2. Negative for notable central canal and foraminal stenosis in patient with a somewhat congenitally narrowed central canal.     Plantar fasciitis, bilateral    Prediabetes    Seasonal allergies    Current Outpatient Medications  Medication Sig Dispense Refill   amLODipine-olmesartan (AZOR) 10-40 MG tablet Take 1 tablet by mouth daily. 90 tablet 3   levothyroxine (SYNTHROID) 100 MCG tablet Take 1 tablet (100 mcg total) by mouth daily before breakfast. 90 tablet 3   No current facility-administered medications for this visit.   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 Can read and write fluently in [place].   Social Determinants of Health   Financial Resource Strain: Medium Risk (05/16/2022)   Overall Financial Resource Strain (CARDIA)    Difficulty of Paying Living Expenses: Somewhat hard  Food Insecurity: Food Insecurity Present (05/16/2022)   Hunger Vital Sign    Worried About Running Out of Food in the Last Year: Sometimes true    Ran Out of Food in the Last Year: Sometimes true  Transportation Needs: Unmet Transportation Needs (05/16/2022)   PRAPARE - Transportation  Lack of Transportation (Medical): Yes    Lack of Transportation (Non-Medical): Yes  Physical Activity: Not on file  Stress: Not on file  Social Connections: Moderately Isolated (05/16/2022)   Social Connection and Isolation Panel [NHANES]    Frequency of Communication with Friends and Family: More than three times a week    Frequency of Social Gatherings with Friends and Family: Once a week    Attends Religious Services: More than 4 times per year    Active Member of [employer] or Organizations: No    Attends Archivist Meetings: Not on file    Marital Status: Widowed     Objective:   Physical Exam:  Vitals:   08/22/22 0906  BP: (!) 134/49  Pulse: 78  Temp: 97.9 F (36.6 C)  TempSrc: Oral  SpO2: 98%  Weight: 235 lb 9.6 oz (106.9 kg)  Height: 5\' 2"  (1.575 m)    Constitutional: Obese, well appearing, NAD Cardiovascular: RRR, 3/6 systolic murmur at the RUSB Pulmonary/Chest: Normal effort on RA Extremities: Warm, no edema, distal pulses 1+   Assessment & Plan:   Post-menopausal Patient is status post total abdominal hysterectomy with bilateral salpingo-oophorectomy for symptomatic fibroids in November 2011. Checking Vitamin D. DEXA previously ordered and still pending.   Hypothyroidism Currently taking Synthroid 100 mcg daily.  Rechecking TSH.  Type 2 diabetes mellitus (HCC) Glycemic control much improved after initiation of dietary changes.  She has cut back on what she calls "white foods", foods high in carbohydrates and sugars.  Hemoglobin A1c today has improved to 6.5 from 7.2 three months ago.  Plan to continue with dietary changes, repeat hemoglobin A1c in 6 months.  Checking urine microalbumin, foot exam completed today.  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  She was previously evaluated by pediatric cardiology but is unsure the etiology of her murmur.  We do not have an echocardiogram in our system.  She is agreeable to obtaining this today.  No signs or symptoms of heart failure or volume overload.  Hypertension Blood pressure improved and at near goal on amlodipine-olmesartan 10-40 mg daily.  Rechecking renal function today.  Healthcare maintenance Declining all vaccines as well as future mammograms.  Did not discuss colon cancer screening, will offer at next visit.

## 2022-08-22 NOTE — Assessment & Plan Note (Signed)
Currently taking Synthroid 100 mcg daily.  Rechecking TSH.

## 2022-08-22 NOTE — Addendum Note (Signed)
Addended by: Jodean Lima on: 08/22/2022 02:29 PM   Modules accepted: Level of Service

## 2022-08-22 NOTE — Progress Notes (Signed)
Subjective:   Joanna Gilbert is a 72 y.o. female who presents for an Initial Medicare Annual Wellness Visit. I connected with  Joanna Gilbert on 08/22/22 by a  Face-To-Face encounter   and verified that I am speaking with the correct person using two identifiers.  Patient Location: Other:  Office/Clinic  Provider Location: Office/Clinic  I discussed the limitations of evaluation and management by telemedicine. The patient expressed understanding and agreed to proceed.  Review of Systems    Defer to PCP       Objective:    Today's Vitals   08/22/22 1412 08/22/22 1413  BP: (!) 134/49   Pulse: 78   Temp: 97.9 F (36.6 C)   TempSrc: Oral   SpO2: 98%   Weight: 235 lb 9.6 oz (106.9 kg)   Height: 5\' 2"  (1.575 m)   PainSc:  7    Body mass index is 43.09 kg/m.     08/22/2022    2:14 PM 08/22/2022    9:08 AM 05/16/2022    9:47 AM 10/04/2021   11:30 AM 09/08/2021    9:29 AM 08/07/2021    2:17 PM 04/20/2020    9:54 AM  Advanced Directives  Does Patient Have a Medical Advance Directive? Yes Yes Yes No Yes No;Yes Yes  Type of Paramedic of [place];Living will [place];Living will [place];Living will  [place];Living will [place];Living will [place];Living will  Does patient want to make changes to medical advance directive? No - Patient declined No - Patient declined No - Patient declined   No - Patient declined No - Patient declined  Copy of [place] in Chart? No - copy requested No - copy requested No - copy requested  No - copy requested  No - copy requested  Would patient like information on creating a medical advance directive?    No - Patient declined       Current Medications (verified) Outpatient Encounter Medications as of 08/22/2022  Medication Sig   amLODipine-olmesartan (AZOR) 10-40 MG tablet Take 1 tablet by mouth daily.    levothyroxine (SYNTHROID) 100 MCG tablet Take 1 tablet (100 mcg total) by mouth daily before breakfast.   No facility-administered encounter medications on file as of 08/22/2022.    Allergies (verified) Asa [aspirin] and Lisinopril   History: Past Medical History:  Diagnosis Date   Anxiety    Depression 02/21/2011   Diverticulosis    Fibroid uterus 05/06/2006   Nov 2011: total abdominal hysterectomy with b/l salpingoopherectomy by Dr. Mora Bellman for symptomatic fibroids.  Qualifier: Diagnosis of  By: [name] MD, Yuri     History of colonic polyps    Hyperlipidemia    Hypertension    Hypothyroidism    Osteoarthritis 02/21/2011   MR b/l Hips (2009): Mild to moderate bilateral hip osteoarthritis, more notable on the right, with likely associated small area of anterior acetabular labral tearing.  MR R knee (2009): Dominant finding is marked interval progression in lateral compartment degenerative disease with new tearing in the posterior horn of the lateral meniscus. Persistent perimeniscal cyst formation related to lateral meniscal tearing as describe above MR Lumbar spine (2009) : 1. Dominant finding is advanced facet degenerative change L4-5 with small facet joint effusions and bony edema about the joints. This could be a source of back pain.  2. Negative for notable central canal and foraminal stenosis in patient with  a somewhat congenitally narrowed central canal.     Plantar fasciitis, bilateral    Prediabetes    Seasonal allergies    Past Surgical History:  Procedure Laterality Date   GASTRIC RESTRICTION SURGERY  1980   KNEE SURGERY Right 2007, 2009   Had cyst removed   LAPAROSCOPIC CHOLECYSTECTOMY  1990   TOTAL ABDOMINAL HYSTERECTOMY W/ BILATERAL SALPINGOOPHORECTOMY  03/2010   because of symptomatic fibroids, Dr. 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 Can read and write fluently in [place].   Social Determinants of Health   Financial Resource Strain: Medium Risk (08/22/2022)   Overall Financial Resource Strain (CARDIA)    Difficulty of Paying Living Expenses: Somewhat hard  Food Insecurity: Food Insecurity Present (08/22/2022)   Hunger Vital Sign    Worried About Running Out of Food in the Last Year: Sometimes true    Ran Out of Food in the Last Year: Sometimes true  Transportation Needs: No Transportation Needs (08/22/2022)   PRAPARE - Hydrologist (Medical): No    Lack of Transportation (Non-Medical): No  Physical Activity: Sufficiently Active (08/22/2022)   Exercise Vital Sign    Days of Exercise per Week: 6 days    Minutes of Exercise per Session: 30 min  Stress: Stress Concern Present (08/22/2022)   Rake    Feeling of Stress : To some extent  Social Connections: Moderately Integrated (08/22/2022)   Social Connection and Isolation Panel [NHANES]    Frequency of Communication with Friends and Family: More than three times a week    Frequency of Social Gatherings with Friends and Family: Never    Attends Religious Services: More than 4 times  per year    Active Member of [employer] or Organizations: Yes    Attends Archivist Meetings: Patient declined    Marital Status: Widowed    Tobacco Counseling Counseling given: Not Answered   Clinical Intake:  Pre-visit preparation completed: Yes  Pain : 0-10 Pain Score: 7  Pain Type: Chronic pain Pain Location: Back Pain Orientation: Lower Pain Descriptors / Indicators: Aching, Constant Pain Onset: More than a month ago Pain Frequency: Constant Pain Relieving Factors: ibuprofen  Pain Relieving Factors: ibuprofen  Nutritional Risks: None Diabetes: Yes CBG done?: Yes CBG resulted in Enter/ Edit results?: Yes Did pt. bring in CBG monitor from home?: No  How often do you need to have someone help  you when you read instructions, pamphlets, or other written materials from your doctor or pharmacy?: 1 - Never What is the last grade level you completed in school?: college  Diabetic?Nutrition Risk Assessment:  Has the patient had any N/V/D within the last 2 months?  No  Does the patient have any non-healing wounds?  No  Has the patient had any unintentional weight loss or weight gain?  No   Diabetes:  Is the patient diabetic?  Yes  If diabetic, was a CBG obtained today?  Yes  Did the patient bring in their glucometer from home?  No    Financial Strains and Diabetes Management:  Are you having any financial strains with the device, your supplies or your medication? No .  Does the patient want to be seen by Chronic Care Management for management of their diabetes?  No  Would the patient like to be referred to a Nutritionist or for Diabetic Management?  No   Diabetic Exams:  Diabetic Eye Exam: Completed 10/02/2021 Diabetic Foot Exam: Completed 08/19/2022    Interpreter Needed?: No  Information entered by :: Darlene Brozowski,cma   Activities of Daily Living    08/22/2022    2:14 PM 08/22/2022    9:07 AM  In your present state of health, do you have any  difficulty performing the following activities:  Hearing? 0 0  Vision? 0 0  Difficulty concentrating or making decisions? 0 0  Walking or climbing stairs? 0 0  Dressing or bathing? 0 0  Doing errands, shopping? 0 0    Patient Care Team: Velna Ochs, MD as PCP - General  Indicate any recent Medical Services you may have received from other than [facility] providers in the past year (date may be approximate).     Assessment:   This is a routine wellness examination for [place].  Hearing/Vision screen No results found.  Dietary issues and exercise activities discussed:     Goals Addressed   None   Depression Screen    08/22/2022    2:14 PM 08/22/2022    9:07 AM 05/16/2022    9:53 AM 09/08/2021    9:28 AM 04/20/2020    9:54 AM 01/07/2018    3:28 PM 12/31/2017    2:46 PM  PHQ 2/9 Scores  PHQ - 2 Score 0 0 0 0 0 0 0  PHQ- 9 Score   0 0       Fall Risk    08/22/2022    2:14 PM 08/22/2022    9:07 AM 05/16/2022    9:39 AM 09/08/2021    9:28 AM 08/07/2021    2:16 PM  Fall Risk   Falls in the past year? 0 0 0 0 0  Number falls in past yr: 0 0 0 0 0  Injury with Fall? 0 0 0 0 0  Risk for fall due to : No Fall Risks No Fall Risks  No Fall Risks No Fall Risks  Follow up Falls evaluation completed;Falls prevention discussed Falls evaluation completed;Falls prevention discussed Falls evaluation completed Falls evaluation completed;Falls prevention discussed Falls evaluation completed;Falls prevention discussed    FALL RISK PREVENTION PERTAINING TO THE HOME:  Any stairs in or around the home?  Patient declined If so, are there any without handrails? Patient declined Home free of loose throw rugs in walkways, pet beds, electrical cords, etc? Patient declined Adequate lighting in your home to reduce risk of falls? Patient declined  ASSISTIVE DEVICES UTILIZED TO PREVENT FALLS:  Life alert?  Patient declined Use of a cane, walker or w/c? Patient declined Grab bars in the bathroom?  Patient declined Shower chair or bench in shower? Patient declined Elevated toilet seat or a handicapped toilet? Patient declined  TIMED UP AND GO:  Was the test performed? No .  Length of time to ambulate 10 feet: 0 sec.   Gait slow and steady without use of assistive device  Cognitive Function:        Immunizations Immunization History  Administered Date(s) Administered   Influenza Split 02/21/2011   Influenza,inj,Quad PF,6+ Mos 02/27/2013   Td 07/26/2009    TDAP status: Due, Education has been provided regarding the importance of this vaccine. Advised may receive this vaccine at local pharmacy or Health Dept. Aware to provide a copy of the vaccination record if obtained from local pharmacy or Health Dept. Verbalized acceptance and understanding.  Flu Vaccine status: Due, Education has been provided regarding the importance of this vaccine. Advised may receive this vaccine at local pharmacy or Health Dept. Aware to provide a copy of the vaccination record if obtained from local pharmacy or Health Dept. Verbalized acceptance and understanding.  Pneumococcal vaccine status: Due, Education has been provided regarding the importance of this vaccine. Advised may receive this vaccine at local pharmacy or Health Dept. Aware to provide a copy of the vaccination record if obtained from local pharmacy or Health Dept. Verbalized acceptance and understanding.  Covid-19 vaccine status: Information provided on how to obtain vaccines.   Qualifies for Shingles Vaccine? No   Zostavax completed No   Shingrix Completed?: No.    Education has been provided regarding the importance of this vaccine. Patient has been advised to call insurance company to determine out of pocket expense if they have not yet received this vaccine. Advised may also receive vaccine at local pharmacy or Health Dept. Verbalized acceptance and understanding.  Screening Tests Health Maintenance  Topic Date Due   COVID-19  Vaccine (1) Never done   Zoster Vaccines- Shingrix (1 of 2) Never done   MAMMOGRAM  10/18/2014   COLON CANCER SCREENING ANNUAL FOBT  08/10/2016   Pneumonia Vaccine 65+ Years old (1 of 1 - PCV) Never done   DTaP/Tdap/Td (2 - Tdap) 07/27/2019   INFLUENZA VACCINE  12/26/2021   Diabetic kidney evaluation - Urine ACR  08/08/2022   Diabetic kidney evaluation - eGFR measurement  09/20/2022   OPHTHALMOLOGY EXAM  10/03/2022   HEMOGLOBIN A1C  02/22/2023   FOOT EXAM  08/22/2023   Medicare Annual Wellness (AWV)  08/22/2023   COLONOSCOPY (Pts 45-64yrs Insurance coverage will need to be confirmed)  08/23/2025   DEXA SCAN  Completed   Hepatitis C Screening  Completed   HPV VACCINES  Aged Out    Health Maintenance  Health Maintenance Due  Topic Date Due   COVID-19 Vaccine (1) Never done   Zoster Vaccines- Shingrix (1 of 2) Never done   MAMMOGRAM  10/18/2014   COLON CANCER SCREENING ANNUAL FOBT  08/10/2016   Pneumonia Vaccine 37+ Years old (1 of 1 - PCV) Never done   DTaP/Tdap/Td (2 - Tdap) 07/27/2019   INFLUENZA VACCINE  12/26/2021   Diabetic kidney evaluation - Urine ACR  08/08/2022   Diabetic kidney evaluation - eGFR measurement  09/20/2022    Colorectal cancer screening: Type of screening: Colonoscopy. Completed 08/24/2015. Repeat every 10 years   Lung Cancer Screening: (Low Dose CT Chest recommended if Age 46-80 years, 30 pack-year currently smoking OR have quit w/in 15years.) does  not qualify.   Lung Cancer Screening Referral: N/A  Additional Screening:  Hepatitis C Screening: does not qualify; Completed 08/03/2015  Vision Screening: Recommended annual ophthalmology exams for early detection of glaucoma and other disorders of the eye. Is the patient up to date with their annual eye exam?  Yes  Who is the provider or what is the name of the office in which the patient attends annual eye exams? Dr.Shapiro If pt is not established with a provider, would they like to be referred to a  provider to establish care? No .   Dental Screening: Recommended annual dental exams for proper oral hygiene  Community Resource Referral / Chronic Care Management: CRR required this visit?  No   CCM required this visit?  No      Plan:     I have personally reviewed and noted the following in the patient's chart:   Medical and social history Use of alcohol, tobacco or illicit drugs  Current medications and supplements including opioid prescriptions. Patient is not currently taking opioid prescriptions. Functional ability and status Nutritional status Physical activity Advanced directives List of other physicians Hospitalizations, surgeries, and ER visits in previous 12 months Vitals Screenings to include cognitive, depression, and falls Referrals and appointments  In addition, I have reviewed and discussed with patient certain preventive protocols, quality metrics, and best practice recommendations. A written personalized care plan for preventive services as well as general preventive health recommendations were provided to patient.     Kerin Perna, [facility]   08/22/2022   Nurse Notes: Face-To-Face Visit  Ms. Burgio , Thank you for taking time to come for your Medicare Wellness Visit. I appreciate your ongoing commitment to your health goals. Please review the following plan we discussed and let me know if I can assist you in the future.   These are the goals we discussed:  Goals      Blood Pressure < 140/90        This is a list of the screening recommended for you and due dates:  Health Maintenance  Topic Date Due   COVID-19 Vaccine (1) Never done   Zoster (Shingles) Vaccine (1 of 2) Never done   Mammogram  10/18/2014   Stool Blood Test  08/10/2016   Pneumonia Vaccine (1 of 1 - PCV) Never done   DTaP/Tdap/Td vaccine (2 - Tdap) 07/27/2019   Flu Shot  12/26/2021   Yearly kidney health urinalysis for diabetes  08/08/2022   Yearly kidney function blood test for  diabetes  09/20/2022   Eye exam for diabetics  10/03/2022   Hemoglobin A1C  02/22/2023   Complete foot exam   08/22/2023   Medicare Annual Wellness Visit  08/22/2023   Colon Cancer Screening  08/23/2025   DEXA scan (bone density measurement)  Completed   Hepatitis C Screening: USPSTF Recommendation to screen - Ages 62-79 yo.  Completed   HPV Vaccine  Aged Out

## 2022-08-22 NOTE — Assessment & Plan Note (Signed)
Patient is status post total abdominal hysterectomy with bilateral salpingo-oophorectomy for symptomatic fibroids in November 2011. Checking Vitamin D. DEXA previously ordered and still pending.

## 2022-08-22 NOTE — Patient Instructions (Signed)
Joanna Gilbert,   It was a pleasure to see you today. Please continue to take all of your medications as prescribed. Follow up with me again in 6 months, or sooner if you have any problems.  If you have any questions or concerns, call our clinic at [phone] or after hours call [phone] and ask for the internal medicine resident on call.   Thank you!  Dr. Darnell Level

## 2022-08-22 NOTE — Addendum Note (Signed)
Addended by: Truddie Crumble on: 08/22/2022 12:09 PM   Modules accepted: Orders

## 2022-08-23 LAB — CMP14 + ANION GAP
ALT: 35 IU/L — ABNORMAL HIGH (ref 0–32)
AST: 38 IU/L (ref 0–40)
Albumin/Globulin Ratio: 1.5 (ref 1.2–2.2)
Albumin: 4.4 g/dL (ref 3.9–4.9)
Alkaline Phosphatase: 113 IU/L (ref 44–121)
Anion Gap: 16 mmol/L (ref 10.0–18.0)
BUN/Creatinine Ratio: 12 (ref 12–28)
BUN: 10 mg/dL (ref 8–27)
Bilirubin Total: 0.3 mg/dL (ref 0.0–1.2)
CO2: 22 mmol/L (ref 20–29)
Calcium: 10.6 mg/dL — ABNORMAL HIGH (ref 8.7–10.3)
Chloride: 101 mmol/L (ref 96–106)
Creatinine, Ser: 0.82 mg/dL (ref 0.57–1.00)
Globulin, Total: 2.9 g/dL (ref 1.5–4.5)
Glucose: 117 mg/dL — ABNORMAL HIGH (ref 70–99)
Potassium: 4 mmol/L (ref 3.5–5.2)
Sodium: 139 mmol/L (ref 134–144)
Total Protein: 7.3 g/dL (ref 6.0–8.5)
eGFR: 77 mL/min/{1.73_m2} (ref >59)

## 2022-08-23 LAB — MICROALBUMIN / CREATININE URINE RATIO
Creatinine, Urine: 186.6 mg/dL
Microalb/Creat Ratio: 73 mg/g creat — ABNORMAL HIGH (ref 0–29)
Microalbumin, Urine: 136.3 ug/mL

## 2022-08-23 LAB — VITAMIN D 25 HYDROXY (VIT D DEFICIENCY, FRACTURES): Vit D, 25-Hydroxy: 74.7 ng/mL (ref 30.0–100.0)

## 2022-08-23 LAB — TSH: TSH: 1.53 u[IU]/mL (ref 0.450–4.500)

## 2022-09-18 ENCOUNTER — Ambulatory Visit (HOSPITAL_COMMUNITY)
Admission: RE | Admit: 2022-09-18 | Discharge: 2022-09-18 | Disposition: A | Discharge status: Home / Self Care | Payer: 59 | Source: Ambulatory Visit | Attending: Internal Medicine | Admitting: Internal Medicine

## 2022-09-18 DIAGNOSIS — E785 Hyperlipidemia, unspecified: Secondary | ICD-10-CM | POA: Insufficient documentation

## 2022-09-18 DIAGNOSIS — I10 Essential (primary) hypertension: Secondary | ICD-10-CM | POA: Diagnosis not present

## 2022-09-18 DIAGNOSIS — E119 Type 2 diabetes mellitus without complications: Secondary | ICD-10-CM | POA: Insufficient documentation

## 2022-09-18 DIAGNOSIS — R011 Cardiac murmur, unspecified: Secondary | ICD-10-CM | POA: Insufficient documentation

## 2022-09-18 LAB — ECHOCARDIOGRAM COMPLETE
AR max vel: 1.08 cm2
AV Area VTI: 1.04 cm2
AV Area mean vel: 1.04 cm2
AV Mean grad: 17.8 mmHg
AV Peak grad: 28.1 mmHg
Ao pk vel: 2.65 m/s
Area-P 1/2: 2.52 cm2
Calc EF: 66.4 %
MV VTI: 2.21 cm2
P 1/2 time: 476 msec
S' Lateral: 2.4 cm
Single Plane A2C EF: 68.5 %
Single Plane A4C EF: 65.8 %

## 2022-09-29 LAB — AMB RESULTS CONSOLE CBG: Glucose: 179

## 2022-10-03 NOTE — Progress Notes (Signed)
Pt only wanted BP check. Pt recommended to go to PCP about blood pressure.

## 2022-10-09 ENCOUNTER — Ambulatory Visit (INDEPENDENT_AMBULATORY_CARE_PROVIDER_SITE_OTHER): Payer: 59 | Admitting: Podiatry

## 2022-10-09 DIAGNOSIS — M2170 Unequal limb length (acquired), unspecified site: Secondary | ICD-10-CM | POA: Diagnosis not present

## 2022-10-09 NOTE — Progress Notes (Signed)
  Subjective:  Patient ID: DEMOND SAHM, female    DOB: December 18, 1950,  MRN: [id number]  Chief Complaint  Patient presents with   Foot Pain    Feels like she is off balance, feels like 1 leg is shorter after knee surgery on the right    72 y.o. female presents with the above complaint. History confirmed with patient.  She had recent knee surgery feels that 1 leg is shorter and throwing her balance off  Objective:  Physical Exam: warm, good capillary refill, no trophic changes or ulcerative lesions, normal DP and PT pulses, and normal sensory exam.  Left limb 8 to 10 mm shorter than left     Radiographs: Multiple views x-ray of the right foot: no fracture, dislocation, swelling or degenerative changes noted and plantar calcaneal spur  MRI completed shows no abnormality within the foot or ankle I reviewed the images personally Assessment:   1. Unequal leg length (acquired)       Plan:  Patient was evaluated and treated and all questions answered.  Agree that she does have a limb length discrepancy about 8 to 10 mm.  Heel lifts dispensed and placed into shoes.  She will let me know if this is helpful or has further issues.   Return if symptoms worsen or fail to improve.

## 2022-10-11 ENCOUNTER — Encounter: Payer: Self-pay | Admitting: *Deleted

## 2022-10-11 NOTE — Progress Notes (Signed)
Pt attended 09/29/22 and 10/03/22 screening events: at 09/29/22 event pt's b/p was 147/81 and blood sugar was 179 and at 10/03/22 event pt's bl/p was 140/81. At events pt confirmed her PCP is Dr. Reymundo Poll at [facility] and pt did not identify any SDOH barriers. Chart review indicates pt last saw PCP on 08/22/22 and had AWV at the PCP office on the same day. In-basket message with both event results sent to PCP. No additional health equity team support indicated at this time

## 2023-05-27 ENCOUNTER — Other Ambulatory Visit: Payer: Self-pay

## 2023-05-27 DIAGNOSIS — I10 Essential (primary) hypertension: Secondary | ICD-10-CM

## 2023-05-27 MED ORDER — AMLODIPINE-OLMESARTAN 10-40 MG PO TABS: 1.0000 | ORAL_TABLET | Freq: Every day | ORAL | 0 refills | Status: DC

## 2023-05-27 NOTE — Telephone Encounter (Signed)
Refill request from the pharmacy, patient last seen 3/27, I called the patient to schedule a fu appointment with [place] I was unable to reach the patient and unable to lvm.

## 2023-05-27 NOTE — Telephone Encounter (Signed)
Approved 90 days until she can be seen. Thanks.

## 2023-06-10 ENCOUNTER — Other Ambulatory Visit: Payer: Self-pay

## 2023-06-10 DIAGNOSIS — E038 Other specified hypothyroidism: Secondary | ICD-10-CM

## 2023-06-10 NOTE — Telephone Encounter (Signed)
 Patient last seen 08/22/22, I called the patient to schedule a appointment, unable to reach the patient unable to lvm.

## 2023-06-11 MED ORDER — LEVOTHYROXINE SODIUM 100 MCG PO TABS: 100.0000 ug | ORAL_TABLET | Freq: Every day | ORAL | 0 refills | Status: DC

## 2023-06-11 NOTE — Telephone Encounter (Signed)
 Approved 1 x refill until she can be seen. Thanks.

## 2023-09-12 ENCOUNTER — Other Ambulatory Visit: Payer: Self-pay

## 2023-09-12 DIAGNOSIS — I10 Essential (primary) hypertension: Secondary | ICD-10-CM

## 2023-09-12 MED ORDER — AMLODIPINE-OLMESARTAN 10-40 MG PO TABS: 1.0000 | ORAL_TABLET | Freq: Every day | ORAL | 0 refills | Status: DC

## 2023-09-12 NOTE — Telephone Encounter (Signed)
 Patient last seen 08/22/22, I called the patient to schedule a follow up appointment I was unable to reach the patient or lvm.

## 2023-09-18 ENCOUNTER — Other Ambulatory Visit: Payer: Self-pay

## 2023-09-18 DIAGNOSIS — E038 Other specified hypothyroidism: Secondary | ICD-10-CM

## 2023-09-18 MED ORDER — LEVOTHYROXINE SODIUM 100 MCG PO TABS: 100.0000 ug | ORAL_TABLET | Freq: Every day | ORAL | 0 refills | Status: DC

## 2023-09-18 NOTE — Telephone Encounter (Signed)
 Patient last seen 08/22/22, I called the patient to schedule a follow up appointment, the number listed on the patients chart does not belong to the patient.

## 2023-12-13 ENCOUNTER — Ambulatory Visit: Payer: Self-pay

## 2023-12-13 ENCOUNTER — Ambulatory Visit

## 2023-12-13 VITALS — BP 138/82 | HR 88 | Temp 98.2°F | Ht 62.0 in | Wt 228.2 lb

## 2023-12-13 DIAGNOSIS — I10 Essential (primary) hypertension: Secondary | ICD-10-CM | POA: Diagnosis not present

## 2023-12-13 DIAGNOSIS — E038 Other specified hypothyroidism: Secondary | ICD-10-CM

## 2023-12-13 DIAGNOSIS — R7303 Prediabetes: Secondary | ICD-10-CM | POA: Diagnosis not present

## 2023-12-13 DIAGNOSIS — E119 Type 2 diabetes mellitus without complications: Secondary | ICD-10-CM

## 2023-12-13 DIAGNOSIS — E039 Hypothyroidism, unspecified: Secondary | ICD-10-CM | POA: Diagnosis not present

## 2023-12-13 DIAGNOSIS — Z0000 Encounter for general adult medical examination without abnormal findings: Secondary | ICD-10-CM

## 2023-12-13 LAB — POCT GLYCOSYLATED HEMOGLOBIN (HGB A1C): Hemoglobin A1C: 6.4 % — AB (ref 4.0–5.6)

## 2023-12-13 LAB — GLUCOSE, CAPILLARY: Glucose-Capillary: 153 mg/dL — ABNORMAL HIGH (ref 70–99)

## 2023-12-13 MED ORDER — LEVOTHYROXINE SODIUM 100 MCG PO TABS: 100.0000 ug | ORAL_TABLET | Freq: Every day | ORAL | 0 refills | Status: DC

## 2023-12-13 MED ORDER — AMLODIPINE-OLMESARTAN 10-40 MG PO TABS: 1.0000 | ORAL_TABLET | Freq: Every day | ORAL | 3 refills | Status: AC

## 2023-12-13 NOTE — Assessment & Plan Note (Addendum)
 Blood pressure was previously improved and at near-goal on Amlodipine -Olmesartan  10-40 mg daily in 07/2022, but today patient reports she discontinued the medication on her own after her sisters warned her of possible side effects. Though patient denied having any of these side effects while on the medication. Her daughter took her BP at home a week ago which was elevated to 160s/80-90s, and so she resumed her BP medication and has been on it for a week now. Today in office, BP is 149/84, and then 132/84 on repeat. Advised her to continue taking Amlodipine -Olmesartan  and we will send a refill today. We will check on her kidney function as well. I have provided patient with a BP log to bring to her next visit. I will send her results via MyChart.  - Refill Amlodipine -Olmesartan  10-40 mg daily - Return to clinic in 4 weeks for BP recheck  - f/u BMP results

## 2023-12-13 NOTE — Assessment & Plan Note (Addendum)
 Discussed with patient about routine healthcare maintenance including obtaining updated vaccines (Prevnar and Shingrix), and routine screening including Mammogram, but she declined all of these. She is agreeable to FOBT screening.  - order FOBT  - f/u CBC

## 2023-12-13 NOTE — Assessment & Plan Note (Signed)
 History of hypothyroidism, currently on Synthroid  100 mcg daily. We will recheck TSH today and refill her Synthroid . - Refill Synthroid  100 mcg daily - f/u TSH results

## 2023-12-13 NOTE — Progress Notes (Signed)
 Patient name: Joanna Gilbert Date of birth: October 30, 1950 Date of visit: 12/13/23  Type of visit: Established Patient Office Visit   Subjective   Chief concern:  Chief Complaint  Patient presents with   Medical Management of Chronic Issues   Medication Refill    Joanna Gilbert is a 73 y.o. female with a history of DMII, HTN, and hypothyroidism who presents to [facility] clinic for Synthroid  refill.  She was last seen in the [facility] on 08/22/22 to follow up on the above issues. At that time, her BP was well-controlled on Amlodipine -Olmesartan  10-40 mg daily. Her diabetes was well-controlled with dietary modifications and an A1C of 6.5. She was taking Synthroid  100 mcg daily for her hypothyroidism.   She reports that she was tolerating her Amlodipine -Olmesartan  medication well, but her sisters told her that there could be negative side effects. Patient reports that she took the medication for years without side effects and only stopped after the info from her sisters. She does state that she began taking the medication again a week ago because she took her BP at home and it was elevated to 160s/80-90s.   She continues to work on her diet and lifestyle modifications.    ROS: Denies headaches, dizziness, fever, chills, runny nose, sore throat, vision changes, hearing changes, chest pain, shortness of breath, difficulty breathing, nausea, vomiting, abdominal pain. Denies increased urinary frequency, pain with urination, constipation or diarrhea. No recent falls.   Patient Active Problem List   Diagnosis Date Noted   Post-menopausal 08/22/2022   Elevated alkaline phosphatase measurement 09/20/2021   Hypercalcemia 09/08/2021   Obesity 09/08/2021   Prediabetes 09/08/2021   Systolic murmur 04/25/2020   Hypertension 01/02/2018   Bilateral rotator cuff dysfunction 07/19/2016   Chronic back pain 08/03/2015   Malaise 08/03/2015   Paresthesias 08/03/2015   Healthcare maintenance 08/03/2015   History  of colonic polyps 08/03/2015   Hyperlipidemia    Depression with anxiety 02/21/2011   Osteoarthritis 02/21/2011   Hypothyroidism 05/06/2006     Past Surgical History:  Procedure Laterality Date   GASTRIC RESTRICTION SURGERY  1980   KNEE SURGERY Right 2007, 2009   Had cyst removed   LAPAROSCOPIC CHOLECYSTECTOMY  1990   TOTAL ABDOMINAL HYSTERECTOMY W/ BILATERAL SALPINGOOPHORECTOMY  03/2010   because of symptomatic fibroids, Dr. Winton Felt     Current Outpatient Medications  Medication Instructions   amLODipine -olmesartan  (AZOR ) 10-40 MG tablet 1 tablet, Oral, Daily   levothyroxine  (SYNTHROID ) 100 mcg, Oral, Daily before breakfast, Call [facility] to schedule follow up before next refill    Social History   Tobacco Use   Smoking status: Never   Smokeless tobacco: Never  Substance Use Topics   Alcohol use: No    Alcohol/week: 0.0 standard drinks of alcohol   Drug use: No      Objective  Today's Vitals   12/13/23 0858 12/13/23 0937  BP: (!) 149/84 138/82  Pulse: 98 88  Temp: 98.2 F (36.8 C)   TempSrc: Oral   SpO2: 98%   Weight: 228 lb 3.2 oz (103.5 kg)   Height: 5' 2 (1.575 m)   Body mass index is 41.74 kg/m.   Physical Exam:   Constitutional: well-appearing female sitting in exam chair, in no acute distress. Ambulates without use of assistance device  HEENT: normocephalic atraumatic, mucous membranes moist Eyes: conjunctiva non-erythematous Neck: supple Cardiovascular: regular rate and rhythm, systolic murmur best heard at A/P, bilateral radial pulses 2+, bilateral dorsal pedal pulses 2+, brisk capillary  refill bilateral feet and hands  Pulmonary/Chest: normal work of breathing on room air, lungs clear to auscultation bilaterally Abdominal: soft, non-tender, non-distended MSK: normal bulk and tone. Neurological: alert & oriented x 3, sensation intact bilateral feet to monofilament Skin: warm and dry, no ulcers or lesions on bilateral feet Psych: mood calm,  behavior normal, thought content normal, judgement normal     The 10-year ASCVD risk score (Arnett DK, et al., 2019) is: 26.5%   Values used to calculate the score:     Age: 39 years     Clincally relevant sex: Female     Is Non-Hispanic African American: Yes     Diabetic: Yes     Tobacco smoker: No     Systolic Blood Pressure: 138 mmHg     Is BP treated: Yes     HDL Cholesterol: 70 mg/dL     Total Cholesterol: 168 mg/dL      Assessment & Plan  Problem List Items Addressed This Visit       Cardiovascular and Mediastinum   Hypertension (Chronic)   Blood pressure was previously improved and at near-goal on Amlodipine -Olmesartan  10-40 mg daily in 07/2022, but today patient reports she discontinued the medication on her own after her sisters warned her of possible side effects. Though patient denied having any of these side effects while on the medication. Her daughter took her BP at home a week ago which was elevated to 160s/80-90s, and so she resumed her BP medication and has been on it for a week now. Today in office, BP is 149/84, and then 132/84 on repeat. Advised her to continue taking Amlodipine -Olmesartan  and we will send a refill today. We will check on her kidney function as well. I have provided patient with a BP log to bring to her next visit. I will send her results via MyChart.  - Refill Amlodipine -Olmesartan  10-40 mg daily - Return to clinic in 4 weeks for BP recheck  - f/u BMP results       Relevant Medications   amLODipine -olmesartan  (AZOR ) 10-40 MG tablet     Endocrine   Hypothyroidism (Chronic)   History of hypothyroidism, currently on Synthroid  100 mcg daily. We will recheck TSH today and refill her Synthroid . - Refill Synthroid  100 mcg daily - f/u TSH results       Relevant Medications   levothyroxine  (SYNTHROID ) 100 MCG tablet   Other Relevant Orders   TSH   RESOLVED: Type 2 diabetes mellitus (HCC) - Primary (Chronic)   Relevant Medications    amLODipine -olmesartan  (AZOR ) 10-40 MG tablet   Other Relevant Orders   POC Hbg A1C (Completed)   Urine Albumin/Creatinine with ratio (send out) [LAB689]   Basic metabolic panel with GFR     Other   Healthcare maintenance (Chronic)   Discussed with patient about routine healthcare maintenance including obtaining updated vaccines (Prevnar and Shingrix), and routine screening including Mammogram, but she declined all of these. She is agreeable to FOBT screening.  - order FOBT  - f/u CBC      Relevant Orders   Fecal occult blood, imunochemical   CBC with Diff   Prediabetes (Chronic)   Patient with prior A1C of 6.5 in 07/2022. She continues to utilize diet and lifestyle modifications. A1C today of 6.4, glucose 153. She is now in the prediabetes range. Congratulated patient on her hard work, and advised her to continue with her diet and lifestyle modifications.  - f/u BMP, albumin/creat ratio  Return in about 4 weeks (around 01/10/2024) for BP recheck.  Patient discussed with Dr. Francesco, who also saw and evaluated the patient.  Doyal Miyamoto, MD [facility]  PGY-1 12/13/2023, 6:12 PM

## 2023-12-13 NOTE — Assessment & Plan Note (Addendum)
 Patient with prior A1C of 6.5 in 07/2022. She continues to utilize diet and lifestyle modifications. A1C today of 6.4, glucose 153. She is now in the prediabetes range. Congratulated patient on her hard work, and advised her to continue with her diet and lifestyle modifications.  - f/u BMP, albumin/creat ratio

## 2023-12-13 NOTE — Patient Instructions (Addendum)
 Thank you, Joanna Gilbert for allowing us  to provide your care today. Today we discussed the following:  - Continue the great work with your diet and lifestyle modifications! You are no longer in the diabetes category, you are now in the pre-diabetes range.  - Continue to take your blood pressure medication, and we will recheck in a month.  - I will send you a MyChart message with your lab results   I have ordered the following labs for you:  Lab Orders         Fecal occult blood, imunochemical         Urine Albumin/Creatinine with ratio (send out) [LAB689]         Basic metabolic panel with GFR         Glucose, capillary         TSH         POC Hbg A1C      I have ordered the following medication refills:   Start the following medications: Meds ordered this encounter  Medications   levothyroxine  (SYNTHROID ) 100 MCG tablet    Sig: Take 1 tablet (100 mcg total) by mouth daily before breakfast. Call [facility] to schedule follow up before next refill    Dispense:  90 tablet    Refill:  0   amLODipine -olmesartan  (AZOR ) 10-40 MG tablet    Sig: Take 1 tablet by mouth daily.    Dispense:  90 tablet    Refill:  3    Needs appointment     Follow up: 4 weeks    Remember: Please bring your medications with you to your next appointment. If you can bring a log of your blood pressure at home to your next visit, that would be very helpful!   Should you have any questions or concerns please call the Internal Medicine Clinic at [phone].     Doyal Miyamoto, MD [facility]

## 2023-12-14 LAB — CBC WITH DIFFERENTIAL/PLATELET
Basophils Absolute: 0 x10E3/uL (ref 0.0–0.2)
Basos: 0 %
EOS (ABSOLUTE): 0.1 x10E3/uL (ref 0.0–0.4)
Eos: 2 %
Hematocrit: 42.4 % (ref 34.0–46.6)
Hemoglobin: 13.6 g/dL (ref 11.1–15.9)
Immature Grans (Abs): 0 x10E3/uL (ref 0.0–0.1)
Immature Granulocytes: 0 %
Lymphocytes Absolute: 3.4 x10E3/uL — ABNORMAL HIGH (ref 0.7–3.1)
Lymphs: 51 %
MCH: 29.6 pg (ref 26.6–33.0)
MCHC: 32.1 g/dL (ref 31.5–35.7)
MCV: 92 fL (ref 79–97)
Monocytes Absolute: 0.3 x10E3/uL (ref 0.1–0.9)
Monocytes: 5 %
Neutrophils Absolute: 2.8 x10E3/uL (ref 1.4–7.0)
Neutrophils: 42 %
Platelets: 279 x10E3/uL (ref 150–450)
RBC: 4.6 x10E6/uL (ref 3.77–5.28)
RDW: 12.5 % (ref 11.7–15.4)
WBC: 6.6 x10E3/uL (ref 3.4–10.8)

## 2023-12-14 LAB — BASIC METABOLIC PANEL WITH GFR
BUN/Creatinine Ratio: 17 (ref 12–28)
BUN: 14 mg/dL (ref 8–27)
CO2: 21 mmol/L (ref 20–29)
Calcium: 10.7 mg/dL — ABNORMAL HIGH (ref 8.7–10.3)
Chloride: 101 mmol/L (ref 96–106)
Creatinine, Ser: 0.82 mg/dL (ref 0.57–1.00)
Glucose: 133 mg/dL — ABNORMAL HIGH (ref 70–99)
Potassium: 4.3 mmol/L (ref 3.5–5.2)
Sodium: 138 mmol/L (ref 134–144)
eGFR: 77 mL/min/1.73 (ref >59)

## 2023-12-14 LAB — TSH: TSH: 1.2 u[IU]/mL (ref 0.450–4.500)

## 2023-12-14 LAB — MICROALBUMIN / CREATININE URINE RATIO
Creatinine, Urine: 150.6 mg/dL
Microalb/Creat Ratio: 50 mg/g{creat} — ABNORMAL HIGH (ref 0–29)
Microalbumin, Urine: 75.6 ug/mL

## 2023-12-16 NOTE — Progress Notes (Signed)
 Internal Medicine Clinic Attending  I was physically present during the key portions of the resident provided service and participated in the medical decision making of patient's management care. I reviewed pertinent patient test results.  The assessment, diagnosis, and plan were formulated together and I agree with the documentation in the resident's note.  Cherylene Corrente, MD

## 2023-12-19 ENCOUNTER — Other Ambulatory Visit: Payer: Self-pay | Admitting: Nurse Practitioner

## 2023-12-19 DIAGNOSIS — R202 Paresthesia of skin: Secondary | ICD-10-CM

## 2023-12-19 DIAGNOSIS — M150 Primary generalized (osteo)arthritis: Secondary | ICD-10-CM

## 2023-12-24 ENCOUNTER — Inpatient Hospital Stay
Admission: RE | Admit: 2023-12-24 | Discharge: 2023-12-24 | Disposition: A | Discharge status: Home / Self Care | Source: Ambulatory Visit | Attending: Nurse Practitioner

## 2023-12-24 DIAGNOSIS — R200 Anesthesia of skin: Secondary | ICD-10-CM

## 2023-12-24 DIAGNOSIS — M150 Primary generalized (osteo)arthritis: Secondary | ICD-10-CM

## 2024-01-14 ENCOUNTER — Other Ambulatory Visit: Payer: Self-pay

## 2024-01-14 DIAGNOSIS — E038 Other specified hypothyroidism: Secondary | ICD-10-CM
